# Patient Record
Sex: Male | Born: 1935 | Race: White | Hispanic: No | Smoking: Never smoker
Health system: Southern US, Community
[De-identification: ages and names within clinical notes are randomized; demographics above are authoritative.]

## PROBLEM LIST (undated history)

## (undated) DIAGNOSIS — I313 Pericardial effusion (noninflammatory): Secondary | ICD-10-CM

## (undated) DIAGNOSIS — K5790 Diverticulosis of intestine, part unspecified, without perforation or abscess without bleeding: Secondary | ICD-10-CM

## (undated) DIAGNOSIS — N400 Enlarged prostate without lower urinary tract symptoms: Secondary | ICD-10-CM

## (undated) DIAGNOSIS — I3139 Other pericardial effusion (noninflammatory): Secondary | ICD-10-CM

## (undated) HISTORY — DX: Other pericardial effusion (noninflammatory): I31.39

## (undated) HISTORY — DX: Pericardial effusion (noninflammatory): I31.3

## (undated) HISTORY — DX: Diverticulosis of intestine, part unspecified, without perforation or abscess without bleeding: K57.90

## (undated) HISTORY — DX: Benign prostatic hyperplasia without lower urinary tract symptoms: N40.0

---

## 1958-02-12 HISTORY — PX: APPENDECTOMY: SHX54

## 1978-10-14 HISTORY — PX: INGUINAL HERNIA REPAIR: SUR1180

## 1998-02-12 HISTORY — PX: CHOLECYSTECTOMY: SHX55

## 1998-12-07 ENCOUNTER — Encounter: Payer: Self-pay | Admitting: Emergency Medicine

## 1998-12-07 ENCOUNTER — Encounter (INDEPENDENT_AMBULATORY_CARE_PROVIDER_SITE_OTHER): Payer: Self-pay | Admitting: Specialist

## 1998-12-07 ENCOUNTER — Inpatient Hospital Stay (HOSPITAL_COMMUNITY): Admission: EM | Admit: 1998-12-07 | Discharge: 1998-12-11 | Payer: Self-pay | Admitting: Emergency Medicine

## 1998-12-09 ENCOUNTER — Encounter: Payer: Self-pay | Admitting: Cardiology

## 1998-12-10 ENCOUNTER — Encounter: Payer: Self-pay | Admitting: General Surgery

## 2000-03-12 ENCOUNTER — Emergency Department (HOSPITAL_COMMUNITY): Admission: EM | Admit: 2000-03-12 | Discharge: 2000-03-12 | Payer: Self-pay | Admitting: Emergency Medicine

## 2001-02-12 HISTORY — PX: TOTAL HIP ARTHROPLASTY: SHX124

## 2001-12-10 ENCOUNTER — Encounter: Payer: Self-pay | Admitting: Orthopedic Surgery

## 2001-12-15 ENCOUNTER — Inpatient Hospital Stay (HOSPITAL_COMMUNITY): Admission: RE | Admit: 2001-12-15 | Discharge: 2001-12-17 | Payer: Self-pay | Admitting: Orthopedic Surgery

## 2001-12-15 ENCOUNTER — Encounter: Payer: Self-pay | Admitting: Orthopedic Surgery

## 2001-12-17 ENCOUNTER — Encounter: Payer: Self-pay | Admitting: Physical Medicine & Rehabilitation

## 2001-12-17 ENCOUNTER — Inpatient Hospital Stay (HOSPITAL_COMMUNITY)
Admission: RE | Admit: 2001-12-17 | Discharge: 2001-12-24 | Payer: Self-pay | Admitting: Physical Medicine & Rehabilitation

## 2001-12-22 ENCOUNTER — Encounter: Payer: Self-pay | Admitting: Physical Medicine & Rehabilitation

## 2004-04-13 ENCOUNTER — Ambulatory Visit (HOSPITAL_COMMUNITY): Admission: RE | Admit: 2004-04-13 | Discharge: 2004-04-13 | Payer: Self-pay | Admitting: Gastroenterology

## 2007-02-13 HISTORY — PX: CATARACT EXTRACTION: SUR2

## 2007-02-13 HISTORY — PX: WRIST SURGERY: SHX841

## 2007-10-27 ENCOUNTER — Emergency Department (HOSPITAL_BASED_OUTPATIENT_CLINIC_OR_DEPARTMENT_OTHER): Admission: EM | Admit: 2007-10-27 | Discharge: 2007-10-27 | Payer: Self-pay | Admitting: Emergency Medicine

## 2009-01-10 ENCOUNTER — Encounter: Admission: RE | Admit: 2009-01-10 | Discharge: 2009-02-10 | Payer: Self-pay | Admitting: Orthopedic Surgery

## 2010-03-09 LAB — CBC
HCT: 40.4 % (ref 39.0–52.0)
Hemoglobin: 13.6 g/dL (ref 13.0–17.0)
MCH: 33.3 pg (ref 26.0–34.0)
MCHC: 33.7 g/dL (ref 30.0–36.0)
MCV: 99 fL (ref 78.0–100.0)
Platelets: 169 10*3/uL (ref 150–400)
RBC: 4.08 MIL/uL — ABNORMAL LOW (ref 4.22–5.81)
RDW: 13.3 % (ref 11.5–15.5)
WBC: 6.6 10*3/uL (ref 4.0–10.5)

## 2010-03-09 LAB — BASIC METABOLIC PANEL
BUN: 21 mg/dL (ref 6–23)
CO2: 30 mEq/L (ref 19–32)
Calcium: 9.4 mg/dL (ref 8.4–10.5)
Chloride: 103 mEq/L (ref 96–112)
Creatinine, Ser: 0.89 mg/dL (ref 0.4–1.5)
GFR calc Af Amer: >60 mL/min (ref >60)
GFR calc non Af Amer: >60 mL/min (ref >60)
Glucose, Bld: 122 mg/dL — ABNORMAL HIGH (ref 70–99)
Potassium: 4.8 mEq/L (ref 3.5–5.1)
Sodium: 139 mEq/L (ref 135–145)

## 2010-03-09 LAB — SURGICAL PCR SCREEN: Staphylococcus aureus: POSITIVE — AB

## 2010-03-16 ENCOUNTER — Ambulatory Visit (HOSPITAL_COMMUNITY)
Admission: RE | Admit: 2010-03-16 | Discharge: 2010-03-17 | Disposition: A | Discharge status: Home / Self Care | Payer: Medicare Other | Attending: Urology | Admitting: Urology

## 2010-03-16 ENCOUNTER — Other Ambulatory Visit: Payer: Self-pay | Admitting: Urology

## 2010-03-16 DIAGNOSIS — I10 Essential (primary) hypertension: Secondary | ICD-10-CM | POA: Insufficient documentation

## 2010-03-16 DIAGNOSIS — R310 Gross hematuria: Secondary | ICD-10-CM | POA: Insufficient documentation

## 2010-03-16 DIAGNOSIS — N401 Enlarged prostate with lower urinary tract symptoms: Secondary | ICD-10-CM | POA: Insufficient documentation

## 2010-03-16 DIAGNOSIS — Z79899 Other long term (current) drug therapy: Secondary | ICD-10-CM | POA: Insufficient documentation

## 2010-03-16 DIAGNOSIS — Z01812 Encounter for preprocedural laboratory examination: Secondary | ICD-10-CM | POA: Insufficient documentation

## 2010-03-16 DIAGNOSIS — Z01810 Encounter for preprocedural cardiovascular examination: Secondary | ICD-10-CM | POA: Insufficient documentation

## 2010-03-16 DIAGNOSIS — N138 Other obstructive and reflux uropathy: Secondary | ICD-10-CM | POA: Insufficient documentation

## 2010-03-16 LAB — ABO/RH: ABO/RH(D): A POS

## 2010-03-16 LAB — TYPE AND SCREEN
ABO/RH(D): A POS
Antibody Screen: NEGATIVE

## 2010-03-18 NOTE — Op Note (Signed)
Terry Gregory, Terry Gregory NO.:  [id number]  MEDICAL RECORD NO.:  [id number]           PATIENT TYPE:  O  LOCATION:  DAYL                         FACILITY:  [facility]  PHYSICIAN:  Heloise Purpura, MD      DATE OF BIRTH:  1921/06/09  DATE OF PROCEDURE:  03/16/2010 DATE OF DISCHARGE:                              OPERATIVE REPORT   PREOPERATIVE DIAGNOSES: 1. Benign prostatic hyperplasia and lower urinary tract symptoms. 2. Gross hematuria.  POSTOPERATIVE DIAGNOSES: 1. Benign prostatic hyperplasia and lower urinary tract symptoms. 2. Gross hematuria.  PROCEDURES: 1. Cystoscopy. 2. Transurethral resection of the prostate.  SURGEON:  Heloise Purpura, MD  ANESTHESIA:  General.  COMPLICATIONS:  None.  ESTIMATED BLOOD LOSS:  Minimal.  SPECIMENS:  Prostate chips.  DISPOSITION:  Specimen to pathology.  DRAINS:  22-French three-way Foley catheter.  INDICATION:  [name] is an 77 year old gentleman who has had severe lower urinary tract symptoms, which have been unresponsive to conservative therapy.  In addition, he recently developed gross hematuria and underwent an evaluation, which included a CT scan and cystoscopy.  No upper urinary tract etiology was identified.  On cystoscopy, the patient was noted to have a large amount of regrowth of his BPH creating a ball valve effect from the median lobe.  His prostate also was extremely friable and most likely the cause for his gross hematuria.  However, he did have some frondular tissue overlying the urothelium in the prostatic urethra, which was potentially concerning for malignancy, although his urine cytology was negative.  After a discussion regarding management options for treatment, he elected to proceed with the above procedures.  The potential risks, complications, and alternative treatment options were discussed in detail and informed consent was obtained.  DESCRIPTION OF PROCEDURE:  The patient was taken to  the operating room and a general anesthetic was administered.  He was given preoperative antibiotics, placed in the dorsal lithotomy position, prepped and draped in the usual sterile fashion.  Next, a preoperative time-out was performed.  Cystourethroscopy was then performed, which revealed a normal anterior urethra.  The prostatic urethra was significant for a large intravesical lobe extending from the posterior/left side of the prostatic urethra and projecting into the bladder.  The ureteral orifices were identified and their normal anatomic position were well away from the bladder neck.  The bladder was also noted to be mildly trabeculated.  A 28-French resectoscope sheath was placed into the urethra without difficulty.  Using the bipolar gyrus loop resection, the aforementioned intravesical lobe of the prostate was then resected along with some of the urothelium within the prostatic urethra, which was somewhat frondular.  All bleeding sites were controlled with cautery. These chips were then evacuated from the bladder.  Inspection revealed no further prostate chips and, again, hemostasis was ensured.  A 22- French three-way Foley catheter was then placed and the patient was begun on normal saline continuous bladder irrigation.  He tolerated the procedure well without complications.  He was able to be transferred to the recovery unit in satisfactory condition.     Heloise Purpura, MD     LB/MEDQ  D:  03/16/2010  T:  03/16/2010  Job:  [id number]  Electronically Signed by Heloise Purpura MD on 03/18/2010 06:34:13 AM

## 2010-03-26 NOTE — Discharge Summary (Signed)
  NAMECHASTON, BRADBURN               ACCOUNT NO.:  [id number]  MEDICAL RECORD NO.:  [id number]           PATIENT TYPE:  I  LOCATION:  1440                         FACILITY:  [facility]  PHYSICIAN:  Heloise Purpura, MD      DATE OF BIRTH:  1921/11/30  DATE OF ADMISSION:  03/16/2010 DATE OF DISCHARGE:  03/17/2010                              DISCHARGE SUMMARY   ADMISSION DIAGNOSES: 1. Benign prostatic hyperplasia. 2. Gross hematuria.  DISCHARGE DIAGNOSES: 1. Benign prostatic hyperplasia. 2. Gross hematuria.  PROCEDURES: 1. Cystoscopy. 2. Transurethral resection of the prostate.  HISTORY AND PHYSICAL:  For full details, please see admission history and physical.  Briefly, Mr. Terry Gregory is an 77 year old gentleman who was noted to have gross hematuria along with lower urinary tract symptoms secondary to bladder outlet obstruction and benign prostatic hyperplasia.  We discussed options and he elected to proceed with the above procedures.  HOSPITAL COURSE:  On March 16, 2010, the patient was taken to the operating room and underwent a cystoscopy along with transurethral resection of the prostate.  He did not have any findings particularly concerning for malignancy.  Postoperatively, he was transferred to a regular hospital room following recovery from anesthesia and was maintained on continuous bladder irrigation with a 3-way catheter.  He remained hemodynamically stable and his urine cleared throughout the evening, and on postoperative day #1 his catheter was removed.  He was unable to void later that afternoon, however.  Therefore, Foley catheter was replaced and he was sent home with an indwelling catheter.  His urine was grossly clear at that time.  DISPOSITION:  Home.  DISCHARGE MEDICATIONS:  He was instructed to resume his regular home medications.  In addition, he was given a prescription to take Vicodin as needed for pain and Colace as a stool softener.  He was  also administered ciprofloxacin as prophylaxis.  DISCHARGE INSTRUCTIONS:  He was instructed to be ambulatory, but specifically told to refrain from any heavy lifting, strenuous activity, or driving.  He was instructed on routine Foley catheter care and administered a leg bag for daytime use.  FOLLOWUP:  He will follow up in the office next week for a voiding trial.     Heloise Purpura, MD     LB/MEDQ  D:  03/21/2010  T:  03/22/2010  Job:  [id number]  Electronically Signed by Heloise Purpura MD on 03/26/2010 09:19:59 PM

## 2010-06-30 NOTE — Discharge Summary (Signed)
NAMEYEHUDA, PRINTUP NO.:  [id number]   MEDICAL RECORD NO.:  [id number]                   PATIENT TYPE:  IPS   LOCATION:  4143                                 FACILITY:  [facility]   PHYSICIAN:  Ellwood Dense, M.D.                DATE OF BIRTH:  September 17, 1921   DATE OF ADMISSION:  12/17/2001  DATE OF DISCHARGE:  12/24/2001                                 DISCHARGE SUMMARY   DISCHARGE DIAGNOSES:  1. Right total hip arthroplasty secondary to advanced osteoarthritis on     December 15, 2001.  2. Postoperative anemia.  3. Hypertension.  4. Benign prostatic hypertrophy.   HISTORY OF PRESENT ILLNESS:  A 77 year old white male admitted on December 15, 2001, with increased right hip pain x 1-1/2 years.  X-rays with advanced  osteoarthritis.  No relief with conservative care.  He underwent a right  total hip arthroplasty on December 15, 2001, per Mila Homer. Sherlean Foot, M.D.  Placed on subcutaneous Lovenox for deep venous thrombosis prophylaxis and  weightbearing as tolerated.  Postoperative pain management with PCA pump,  which was discontinued on December 17, 2001.  No chest pain or shortness of  breath.  Moderate assist for bed mobility and transfers.  Latest hemoglobin  9.8.  Chemistries unremarkable.  Admitted for a comprehensive rehabilitation  program.   PAST MEDICAL HISTORY:  1. Hypertension.  2. Benign prostatic hypertrophy.   PAST SURGICAL HISTORY:  1. Cholecystectomy.  2. Appendectomy.   ALLERGIES:  PENICILLIN and E-MCYIN.   PRIMARY CARE PHYSICIAN:  Geoffry Paradise, M.D.   HABITS:  Denies alcohol or tobacco.   MEDICATIONS PRIOR TO ADMISSION:  1. Toprol XL 50 mg daily.  2. Elavil 10 mg at bedtime.   SOCIAL HISTORY:  [place]  Independent prior to admission.  Retired.  One-level home with two steps to  entry.  His wife can assist on discharge.  His daughter works day shift.   HOSPITAL COURSE:   The patient did well while in rehabilitation services with  therapies initiated on a b.i.d. basis.  The following issues were following  during the patient's rehabilitation course.  Pertaining to the patient's  right total hip arthroplasty, surgical site healing nicely with no signs of  infection.  He was weightbearing as tolerated with hip precautions as per  orthopedic services, Mila Homer. Sherlean Foot, M.D.  He continued on subcutaneous  Lovenox for deep venous thrombosis prophylaxis.  Venous Doppler studies  prior to his discharge were negative.  Postoperative anemia stable with  latest hemoglobin 10.1 and hematocrit 29.9.  The blood pressure is  controlled on Toprol XL  He had no bowel or bladder disturbances with noted  history of benign prostatic hypertrophy.  During his rehabilitation course,  he spiked a fever to 102 degrees.  The initial chest x-ray had questionable  infiltrate to the left lower  lobe.  He was placed on Cipro on December 18, 2001, at 500 mg twice daily.  The fever had resolved.  He had no productive  cough and no sputum activity.  He developed a rash to his upper back and  buttocks.  Hydrocortisone cream had been ordered.  Cipro discontinued on  December 22, 2001, secondary to rash.  Follow-up chest x-ray with no active  disease.  The rash continued to improved.   Functional mobility with supervision for bed mobility, ambulating 150-200  feet with close supervision, and navigating stairs with supervision.  He  required simple setup for activities of daily living.  He was refusing  adaptive equipment.   The latest labs include a hemoglobin of 10.1, hematocrit 29.9, platelets  121,000, sodium 134, potassium 3.7, BUN 12, and creatinine 1.0.   DISCHARGE MEDICATIONS:  1. OxyContin Sustained Release 10 mg one tablet every 12 hours x 1 week and     then stop.  2. Tylox as needed for pain.  3. Toprol XL 50 mg daily.  4. Elavil 10 mg at bedtime.  5. Tylenol as needed.    ACTIVITY:  Weightbearing as tolerated with hip precautions.   DIET:  Regular.   WOUND CARE:  Cleanse incision daily with warm soap and water.   FOLLOW-UP:  Home health physical and occupational therapy.  The patient  should follow up with Mila Homer. Sherlean Foot, M.D., of orthopedic services, in one  week for removal of staples and with Geoffry Paradise, M.D., for medical  management.     Mariam Dollar, P.A.                     Ellwood Dense, M.D.    DA/MEDQ  D:  12/23/2001  T:  12/23/2001  Job:  [id number]   cc:   Geoffry Paradise, M.D.  [address]  [place]  [place] [address]  Fax: [phone]   Mila Homer. Sherlean Foot, M.D.  [address] [place]  [place] [address]  Fax: [phone]

## 2010-06-30 NOTE — Discharge Summary (Signed)
NAMEELAND, LAMANTIA                           ACCOUNT NO.:  [id number]   MEDICAL RECORD NO.:  [id number]                   PATIENT TYPE:   LOCATION:                                       FACILITY:  [facility]   PHYSICIAN:  Mila Homer. Sherlean Foot, M.D.              DATE OF BIRTH:  [birth year]   DATE OF ADMISSION:  12/15/2001  DATE OF DISCHARGE:  12/17/2001                                 DISCHARGE SUMMARY   ADMISSION DIAGNOSES:  1. End-stage osteoarthritis, right hip.  2. Hypertension.  3. Benign prostatic hypertrophy.   DISCHARGE DIAGNOSES:  1. End-stage osteoarthritis, right hip, status post right total hip     arthroplasty.  2. Acute blood loss anemia secondary to surgery.  3. Constipation.  4. Mild leukocytosis.  5. Hypertension.  6. Benign prostatic hypertrophy.   SURGICAL PROCEDURE:  On December 15, 2001, the patient underwent a right  total hip arthroplasty by Dr. Mila Homer. Lucey, assisted by Legrand Pitts Duffy,  P.A.C.  He had a Zimmer Trilogy spiked acetabular system, size 64-mm porous-  coat acetabular cup with a Longevity cross-link polyethylene liner, 32 mm,  with 10 degree elevated rim.  He had a VerSys hip system femoral stem, 12/14  neck taper, collared, size 14, and then a VerSys hip system femoral head,  12/14 taper, 32-mm diameter head with +0-mm neck length.   COMPLICATIONS:  None.   CONSULTATIONS:  1. Physical therapy and rehab medicine consult, December 16, 2001.  2. Occupational therapy consult, December 17, 2001.   HISTORY OF PRESENT ILLNESS:  This 77 year old white male patient presented  to Dr. Sherlean Foot with a one-and-a-half-year history of progressively worsening  right hip pain and difficulty with range of motion.  The pain is sharp and  stabbing in the right groin and occurs with range of motion.  There is no  radiation.  He has difficulty in climbing up or down stairs and getting in  and out of chairs; because of this, he is presenting for right hip  replacement.   HOSPITAL COURSE:  The patient tolerated his surgical procedure well without  immediate postoperative complications.  He was transferred to 5000.  On  postop day 1, he was afebrile, vital signs stable.  Hemoglobin 10.6,  hematocrit 31.5.  He was started on therapy per protocol.   Postop day #2, he continued to do well, T-max 101.4, vitals otherwise  stable, right hip incision well-approximated, hemoglobin 9.8, hematocrit  29.7.  He had a mild leukocytosis of 12.4 and that was monitored.  A rehab  bed became available for him and he was able to be transferred to rehab at  that time.   DISCHARGE INSTRUCTIONS:  Diet:  He can continue his current hospitalization  diet.   Medications:  He is to continue his current hospitalization medications with  adjustments to be made per the rehab physicians.   Activity:  He is  to be out of bed, weightbearing as tolerated on the right  leg with the use of the walker.  Continue PT and OT per rehab protocols.   Wound care:  Please keep the right hip incision clean and dry and may clean  the wound with Betadine every day.  He needs staples removed about postop  day 14.  Steri-Strips with Benzoin can be applied at that time.  This can be  done while he is in rehab, but if he is discharged prior to this date, then  he needs to follow up with Dr. Sherlean Foot about that time.   FOLLOWUP:  He needs to follow up with Dr. Sherlean Foot in our office either on  approximately postop day 14 for staple removal and first postop check, or  two weeks after discharge from rehab if his staples are removed while he is  in rehab.   SPECIAL DISCHARGE INSTRUCTIONS:  Please notify Dr. Sherlean Foot of temperature  greater than 101.5, chills, pain unrelieved by pain medications or foul-  smelling drainage from the wound.   LABORATORY DATA:  AP of the pelvis done on December 15, 2001 showed a  satisfactory right total hip replacement with the femoral head prosthesis  and rod in  satisfactory position.   On December 16, 2001, hemoglobin 10.6, hematocrit 31.5, platelets 130,000.  On November 5th, white count 12.5, hemoglobin 9.8, hematocrit 29.7,  platelets 114,000.  On November 4th, glucose 139, and on November 5th,  glucose 167.  All other laboratory studies were within normal limits.     Legrand Pitts Duffy, P.A.                      Mila Homer. Sherlean Foot, M.D.    KED/MEDQ  D:  01/15/2002  T:  01/16/2002  Job:  [id number]   cc:   Geoffry Paradise, M.D.  [address]  [place]  [place] [address]  Fax: [phone]

## 2010-06-30 NOTE — Op Note (Signed)
NAMECALEEL, KINER                           ACCOUNT NO.:  [id number]   MEDICAL RECORD NO.:  [id number]                   PATIENT TYPE:  INP   LOCATION:  NA                                   FACILITY:  [facility]   PHYSICIAN:  Mila Homer. Sherlean Foot, M.D.              DATE OF BIRTH:  [birth year]   DATE OF PROCEDURE:  12/15/2001  DATE OF DISCHARGE:                                 OPERATIVE REPORT   SURGEON:  Mila Homer. Sherlean Foot, M.D.   ASSISTANT:  Legrand Pitts. Duffy, P.A.   PREOPERATIVE DIAGNOSIS:  Osteoarthritis right hip.   POSTOPERATIVE DIAGNOSIS:  Osteoarthritis right hip.   PROCEDURE:  Right total hip arthroplasty.   INDICATIONS FOR PROCEDURE:  The patient is a 77 year old white male with  failure of conservative measures for osteoarthritis of the hip.  The patient  understood the risks versus benefits of the procedure and informed consent  was obtained.  Preoperative medical clearance was obtained as well.   DESCRIPTION OF PROCEDURE:  The patient was laid supine, administered general  anesthesia, a Foley catheter was placed, and he was then placed in the left  down, right up lateral decubitus position and held in place with hip  positioners and bony prominences were well-padded.  The right hip was  prepped and draped in usual sterile fashion.  Mini incision was used and  made approximately 4.5 inches long with a #10-blade centered over the  anterior half of the greater trochanter.  Cautery dissection was continued  down to and through the fascia lata and the fascia lata was held in place  with a Charnley retractor.  At this point, the anterior half of the gluteus  medius and vastus lateralis was elevated off the trochanter.  The gluteus  minimus was elevated as well and these three structures were tagged.  The  anterior hip capsule was removed.  The hip was then dislocated with flexion  and external rotation.  The foot was placed off in a sterile pouch.  The  neck cut was made with a  sagittal saw according to the neck cutting  template.  The head was removed and sized.  At this point, the retractors  were placed anterior and posterior to the acetabulum.  The labrum was  removed.  The transverse acetabular ligament was cut so we could get the  femur to roll further away from the pelvis.  There was a large anterior  osteophyte, which I removed with an osteotome, mallet, and rongeur.  There  was also a kissing osteophyte on the trochanter and that was removed as  well.  Once I had removed all the labrum and the deep soft tissues in the  socket, I then searched sizes on the table with my P.A. and then  sequentially reamed up to a 62 reamer and then with a 62 trial in place, I  could tell that this was an  excellent fit, so I placed in a 64 mm fiber  mesh, 3-spiked cup.  At this point, I placed a trial 10 mm posterior lip  liner in place and then turned our attention to the femoral canal.  I then  flexed and externally rotated the leg off the bed into the sterile pouch,  used the canal finder to find the canal, side-biting reamer to get into the  trochanter so we could put it in neutral, and then sequentially reamed up to  14 mm.  I then broached up to a size 14 and used the medial modified broach  to have more metaphyseal fit.  I then placed the Foy porous-coated, size 14,  medial modified stem down the canal and at this point, placed a trial with a  zero ball, which afforded excellent stability.  I then cleaned off the Morse  taper, tamped on the zero ball located to fit.  I took it through an  aggressive range of motion.  There was excellent stability.  Leg length was  also good.  Then, irrigated, closed the gluteus medius, minimus, and vastus  lateralis through three drill holes in the trochanter.  Then, closed the  fascia lata with interrupted #1 Vicryl sutures, deep subcutaneous tissues  with interrupted zero Vicryls, subcuticular 2-0 Vicryl, and skin staples.  Dressed  with Adaptic, 4 x 4s, sterile Webril, and a sterile Ioban drape.  The patient tolerated the procedure well.  Complications - none.  Drains -  none.  Estimated blood loss - 300 cc.                                                Mila Homer. Sherlean Foot, M.D.    SDL/MEDQ  D:  12/15/2001  T:  12/15/2001  Job:  [id number]

## 2010-06-30 NOTE — Op Note (Signed)
NAMEPADEN, SENGER               ACCOUNT NO.:  [id number]   MEDICAL RECORD NO.:  [id number]          PATIENT TYPE:  AMB   LOCATION:  ENDO                         FACILITY:  [facility]   PHYSICIAN:  John C. Madilyn Fireman, M.D.    DATE OF BIRTH:  31-Oct-1921   DATE OF PROCEDURE:  04/13/2004  DATE OF DISCHARGE:                                 OPERATIVE REPORT   PROCEDURE:  Colonoscopy.   ENDOSCOPIST:  Everardo All. Madilyn Fireman, M.D.   INDICATIONS FOR PROCEDURE:  Average-risk colon cancer screening in an 77-  year-old patient.   PROCEDURE:  The patient was placed in the left lateral decubitus position  and placed on the pulse monitor with continuous low-flow oxygen delivered by  nasal cannula.  He was sedated with 50 mcg IV fentanyl and 5 milligrams IV  Versed.  The Olympus video colonoscope was inserted into the rectum and  advanced to the cecum, confirmed by transillumination of McBurney's point  and visualization of ileocecal valve and appendiceal orifice.  The prep was  suboptimal in some areas and I could not rule out lesions less than 1 cm in  all the places; otherwise, the cecum and ascending colon appeared normal.  Within the transverse, descending and sigmoid colon there were seen multiple  diverticula and no other abnormalities.  The rectum appeared normal and a  retroflexed view of the anus revealed no obvious internal hemorrhoids.  The  scope was then withdrawn and the patient returned to the recovery room in  stable condition.  He tolerated the procedure well and there were no  immediate complications.   IMPRESSION:  Diverticulosis, otherwise normal study.      JCH/MEDQ  D:  04/13/2004  T:  04/13/2004  Job:  [id number]   cc:   Geoffry Paradise, M.D.  [address]  [place]  [place] [address]  Fax: [phone]

## 2010-06-30 NOTE — H&P (Signed)
NAMESTPEHEN, Terry Gregory                           ACCOUNT NO.:  [id number]   MEDICAL RECORD NO.:  [id number]                   PATIENT TYPE:   LOCATION:                                       FACILITY:  [facility]   PHYSICIAN:  Mila Homer. Sherlean Foot, M.D.              DATE OF BIRTH:  [birth year]   DATE OF ADMISSION:  12/15/2001  DATE OF DISCHARGE:                                HISTORY & PHYSICAL   CHIEF COMPLAINT:  Right hip pain.   HISTORY OF PRESENT ILLNESS:  The patient is a 77 year old white male, with a  history of one and a half year, progressively worsening right hip pain and  difficulty with range of motion.  The patient's states he has a significant  amount of discomfort and difficulty with any steps, climbing stairs, in and  out of chairs, carrying heavy loads.  He does describe the pain as a sharp,  stabbing type pain in his right groin.  It does occur with range of motion.  It does not radiate anywhere.  He does not have any discomfort while  resting.  He denies any mechanical symptoms.  X-rays reveal virtual medial  joint spacing obliteration with a large osteophyte.   ALLERGIES:  1. PENICILLIN.  2. ERYTHROMYCIN.   CURRENT MEDICATIONS:  1. __________ 500 mg p.o. b.i.d.  2. Toprol XL 50 mg p.o. q.d.  3. Amitriptyline 100 mg p.o. q.h.s.   PAST MEDICAL HISTORY:  1. Hypertension.  2. BPH.   PAST SURGICAL HISTORY:  1. An appendectomy in 1960.  2. Prostate surgery in 2002.  3. Cholecystitis in 2000.  4. The patient denies any complications with of his above-mentioned surgical     procedures.   SOCIAL HISTORY:  The patient is a 77 year old, thin, healthy-appearing,  white male.  Denies any history of smoking or alcohol use.  He is married,  lives with his wife in an [place] house with two steps to the main  entrance.  He is a retired [employer].   FAMILY PHYSICIAN:  Geoffry Paradise, MD at [phone].   FAMILY MEDICAL HISTORY:  Mother is deceased from a cerebral  hemorrhage.  Father is deceased from a heart attack.  The patient has one brother  deceased from cardiac disease.  One sister deceased from brain cancer, a  second deceased from a rare blood disorder.  The patient has two brothers  alive, one with a significant cardiac history, the other with hypertension,  and one sister alive, unknown medical issues.   REVIEW OF SYSTEMS:  Positive for glasses at all times.  He does have some  generalized hearing loss.  He does have increased urinary urgency and  frequency due to his prostate difficulties.  Otherwise, all of the other  review of systems are negative including sensory, respiratory, cardiac, GI,  GU, general, hematologic, musculoskeletal, neurologic, and mental status.   PHYSICAL EXAMINATION:  VITAL SIGNS:  Height is 6 feet 3 inches.  Weight is  210 pounds.  Pulse is 72 and regular, respirations 14, temperature 96.7,  blood pressure 146/72.  GENERAL:  This is a healthy-appearing, tall in stature, well-developed white  male.  He ambulates with a little obvious gait abnormality.  He is able to  get himself on and off the exam table without any difficulty.  HEENT:  Head is normocephalic, atraumatic.  He is nontender over facial  sinus regions.  External ears without deformities.  Canals patent.  TM's  pearly gray and intact.  The patient is able to hear normal voice tones  without difficulty.  Pupils are equal, round, reactive, accommodating to  light.  Extraocular movements intact.  Sclerae is not icteric.  Conjunctivae  is pink and moist.  Nasal septum is midline.  Mucous membranes pink and  moist.  Oral buccal mucosa is pink and moist without lesions.  Dentition is  in fair repair.  Uvula is midline.  Patient is able to swallow without any  difficulty.  NECK:  Supple, no palpable lymphadenopathy.  Thyroid glands are nontender.  He has excellent range of motion of cervical spine without any difficulty or  tenderness with palpation or  percussion along the spinal column.  CHEST:  Lungs sounds are clear and equal bilaterally.  No wheezes, rales,  rhonchi, or rubs noted.  HEART:  Distant regular rate and rhythm.  S1 and S2 are auscultated.  No  murmurs, rubs, or gallops noted.  ABDOMEN:  Soft, nontender, unable to palpate any hepatosplenomegaly.  CVA  was nontender to percussion.  Bowel sounds are normal throughout.  EXTREMITIES:  Upper extremities are symmetrically size and shape.  He has  excellent range of motion of his shoulders, elbows, and wrists with 5/5  motor strength.  Left hip had full extension and flexion up to 130 degrees.  He has 20-30 degrees internal and external rotation without difficulty.  Right hip has full extension, flexion up to 120 degrees.  He has 0 degrees  internal rotation and 10 degrees external rotation, limited by discomfort.  Bilateral knees are symmetrical with good range of motion.  No obvious  abnormalities.  No signs of effusions or infections.  Calves are nontender.  Ankles are symmetrical with good dorsoplantar flexion.  Peripheral vascular  carotid pulses 2+, no bruits.  Radial pulses 2+.  Femoral pulses 2+ on the  right, 3+ on the left with 2+ posterior tibial and dorsalis pedis pulses.  No varicosities or venostasis changes noted.  NEUROLOGIC:  The patient is conscious, alert, and appropriate with  conversation with examiner.  Cranial nerves 2-12 are grossly intact.  Deep  tendon reflexes are symmetrical, right to left, upper and lower extremities.  He has no gross neurologic deficits.   IMPRESSION:  1. End-stage osteoarthritis, right hip.  2. Hypertension.  3. Benign prostatic hypertrophy.   PLAN:  The patient will be admitted to [facility] on [facility] 15, 2001, under the care of Mila Homer. Lucey, M.D., for a right total hip  arthroplasty.  The patient will undergo all routine labs and tests prior to having the surgical procedure.  The patient has been evaluated by  Geoffry Paradise, MD and felt capable of having the surgery at this time.      Jamelle Rushing, P.A.                      Mila Homer. Sherlean Foot, M.D.  RWK/MEDQ  D:  12/02/2001  T:  12/02/2001  Job:  [id number]

## 2010-08-23 ENCOUNTER — Other Ambulatory Visit: Payer: Self-pay | Admitting: Dermatology

## 2010-11-15 LAB — COMPREHENSIVE METABOLIC PANEL
ALT: 20
AST: 30
Albumin: 4.1
Alkaline Phosphatase: 52
BUN: 16
CO2: 28
Calcium: 9.2
Chloride: 105
Creatinine, Ser: 0.9
GFR calc Af Amer: >60
GFR calc non Af Amer: >60
Glucose, Bld: 81
Potassium: 4.7
Sodium: 140
Total Bilirubin: 0.4
Total Protein: 7

## 2010-11-15 LAB — APTT: aPTT: 27

## 2010-11-15 LAB — CBC
HCT: 39.5
Hemoglobin: 13.6
MCHC: 34.3
MCV: 96.7
Platelets: 163
RBC: 4.09 — ABNORMAL LOW
RDW: 12.7
WBC: 9.5

## 2010-11-15 LAB — PROTIME-INR
INR: 1.1
Prothrombin Time: 14.4

## 2010-11-15 LAB — URINALYSIS, ROUTINE W REFLEX MICROSCOPIC
Bilirubin Urine: NEGATIVE
Glucose, UA: NEGATIVE
Hgb urine dipstick: NEGATIVE
Ketones, ur: NEGATIVE
Nitrite: NEGATIVE
Protein, ur: NEGATIVE
Specific Gravity, Urine: 1.012
Urobilinogen, UA: 0.2
pH: 6.5

## 2010-11-15 LAB — DIFFERENTIAL
Basophils Absolute: 0
Basophils Relative: 0
Eosinophils Absolute: 0
Eosinophils Relative: 0
Lymphocytes Relative: 12
Lymphs Abs: 1.1
Monocytes Absolute: 0.7
Monocytes Relative: 7
Neutro Abs: 7.7
Neutrophils Relative %: 80 — ABNORMAL HIGH

## 2010-11-15 LAB — POCT CARDIAC MARKERS
CKMB, poc: 1.8
CKMB, poc: 3.2
Myoglobin, poc: 164
Myoglobin, poc: 184
Troponin i, poc: <0.05
Troponin i, poc: <0.05

## 2010-11-15 LAB — URINE CULTURE
Colony Count: NO GROWTH
Culture: NO GROWTH

## 2010-12-21 ENCOUNTER — Other Ambulatory Visit: Payer: Self-pay | Admitting: Dermatology

## 2011-02-28 DIAGNOSIS — L819 Disorder of pigmentation, unspecified: Secondary | ICD-10-CM | POA: Diagnosis not present

## 2011-02-28 DIAGNOSIS — Z85828 Personal history of other malignant neoplasm of skin: Secondary | ICD-10-CM | POA: Diagnosis not present

## 2011-02-28 DIAGNOSIS — L821 Other seborrheic keratosis: Secondary | ICD-10-CM | POA: Diagnosis not present

## 2011-03-28 DIAGNOSIS — R109 Unspecified abdominal pain: Secondary | ICD-10-CM | POA: Diagnosis not present

## 2011-03-28 DIAGNOSIS — K5730 Diverticulosis of large intestine without perforation or abscess without bleeding: Secondary | ICD-10-CM | POA: Diagnosis not present

## 2011-03-28 DIAGNOSIS — R1013 Epigastric pain: Secondary | ICD-10-CM | POA: Diagnosis not present

## 2011-03-28 DIAGNOSIS — I319 Disease of pericardium, unspecified: Secondary | ICD-10-CM | POA: Diagnosis not present

## 2011-03-29 DIAGNOSIS — K5730 Diverticulosis of large intestine without perforation or abscess without bleeding: Secondary | ICD-10-CM | POA: Diagnosis not present

## 2011-03-29 DIAGNOSIS — R1013 Epigastric pain: Secondary | ICD-10-CM | POA: Diagnosis not present

## 2011-04-16 DIAGNOSIS — R609 Edema, unspecified: Secondary | ICD-10-CM | POA: Diagnosis not present

## 2011-04-16 DIAGNOSIS — I319 Disease of pericardium, unspecified: Secondary | ICD-10-CM | POA: Diagnosis not present

## 2011-04-16 DIAGNOSIS — I10 Essential (primary) hypertension: Secondary | ICD-10-CM | POA: Diagnosis not present

## 2011-04-19 DIAGNOSIS — I318 Other specified diseases of pericardium: Secondary | ICD-10-CM | POA: Diagnosis not present

## 2011-04-19 DIAGNOSIS — R0602 Shortness of breath: Secondary | ICD-10-CM | POA: Diagnosis not present

## 2011-04-24 DIAGNOSIS — I319 Disease of pericardium, unspecified: Secondary | ICD-10-CM | POA: Diagnosis not present

## 2011-04-24 DIAGNOSIS — Z0000 Encounter for general adult medical examination without abnormal findings: Secondary | ICD-10-CM | POA: Diagnosis not present

## 2011-04-24 DIAGNOSIS — M1990 Unspecified osteoarthritis, unspecified site: Secondary | ICD-10-CM | POA: Diagnosis not present

## 2011-04-24 DIAGNOSIS — J984 Other disorders of lung: Secondary | ICD-10-CM | POA: Diagnosis not present

## 2011-05-02 DIAGNOSIS — R0602 Shortness of breath: Secondary | ICD-10-CM | POA: Diagnosis not present

## 2011-05-02 DIAGNOSIS — I318 Other specified diseases of pericardium: Secondary | ICD-10-CM | POA: Diagnosis not present

## 2011-05-21 DIAGNOSIS — H43819 Vitreous degeneration, unspecified eye: Secondary | ICD-10-CM | POA: Diagnosis not present

## 2011-05-21 DIAGNOSIS — H01009 Unspecified blepharitis unspecified eye, unspecified eyelid: Secondary | ICD-10-CM | POA: Diagnosis not present

## 2011-05-21 DIAGNOSIS — H04129 Dry eye syndrome of unspecified lacrimal gland: Secondary | ICD-10-CM | POA: Diagnosis not present

## 2011-05-21 DIAGNOSIS — Z961 Presence of intraocular lens: Secondary | ICD-10-CM | POA: Diagnosis not present

## 2011-07-05 DIAGNOSIS — I10 Essential (primary) hypertension: Secondary | ICD-10-CM | POA: Diagnosis not present

## 2011-07-05 DIAGNOSIS — Z125 Encounter for screening for malignant neoplasm of prostate: Secondary | ICD-10-CM | POA: Diagnosis not present

## 2011-07-12 DIAGNOSIS — I10 Essential (primary) hypertension: Secondary | ICD-10-CM | POA: Diagnosis not present

## 2011-07-12 DIAGNOSIS — R7309 Other abnormal glucose: Secondary | ICD-10-CM | POA: Diagnosis not present

## 2011-07-12 DIAGNOSIS — I319 Disease of pericardium, unspecified: Secondary | ICD-10-CM | POA: Diagnosis not present

## 2011-07-12 DIAGNOSIS — Z125 Encounter for screening for malignant neoplasm of prostate: Secondary | ICD-10-CM | POA: Diagnosis not present

## 2011-07-12 DIAGNOSIS — Z0000 Encounter for general adult medical examination without abnormal findings: Secondary | ICD-10-CM | POA: Diagnosis not present

## 2011-07-16 DIAGNOSIS — Z1212 Encounter for screening for malignant neoplasm of rectum: Secondary | ICD-10-CM | POA: Diagnosis not present

## 2011-07-31 DIAGNOSIS — I319 Disease of pericardium, unspecified: Secondary | ICD-10-CM | POA: Diagnosis not present

## 2011-08-29 DIAGNOSIS — L570 Actinic keratosis: Secondary | ICD-10-CM | POA: Diagnosis not present

## 2011-08-29 DIAGNOSIS — Z85828 Personal history of other malignant neoplasm of skin: Secondary | ICD-10-CM | POA: Diagnosis not present

## 2011-08-29 DIAGNOSIS — B359 Dermatophytosis, unspecified: Secondary | ICD-10-CM | POA: Diagnosis not present

## 2011-08-29 DIAGNOSIS — L253 Unspecified contact dermatitis due to other chemical products: Secondary | ICD-10-CM | POA: Diagnosis not present

## 2011-10-30 DIAGNOSIS — I319 Disease of pericardium, unspecified: Secondary | ICD-10-CM | POA: Diagnosis not present

## 2011-10-30 DIAGNOSIS — I318 Other specified diseases of pericardium: Secondary | ICD-10-CM | POA: Diagnosis not present

## 2011-11-05 DIAGNOSIS — I319 Disease of pericardium, unspecified: Secondary | ICD-10-CM | POA: Diagnosis not present

## 2011-11-28 DIAGNOSIS — Z23 Encounter for immunization: Secondary | ICD-10-CM | POA: Diagnosis not present

## 2011-11-28 DIAGNOSIS — H6120 Impacted cerumen, unspecified ear: Secondary | ICD-10-CM | POA: Diagnosis not present

## 2011-11-28 DIAGNOSIS — H903 Sensorineural hearing loss, bilateral: Secondary | ICD-10-CM | POA: Diagnosis not present

## 2012-01-14 DIAGNOSIS — R609 Edema, unspecified: Secondary | ICD-10-CM | POA: Diagnosis not present

## 2012-01-14 DIAGNOSIS — R7309 Other abnormal glucose: Secondary | ICD-10-CM | POA: Diagnosis not present

## 2012-01-14 DIAGNOSIS — M1990 Unspecified osteoarthritis, unspecified site: Secondary | ICD-10-CM | POA: Diagnosis not present

## 2012-01-14 DIAGNOSIS — I319 Disease of pericardium, unspecified: Secondary | ICD-10-CM | POA: Diagnosis not present

## 2012-01-14 DIAGNOSIS — I10 Essential (primary) hypertension: Secondary | ICD-10-CM | POA: Diagnosis not present

## 2012-01-28 ENCOUNTER — Other Ambulatory Visit (HOSPITAL_COMMUNITY): Payer: Self-pay | Admitting: Cardiovascular Disease

## 2012-01-28 DIAGNOSIS — I318 Other specified diseases of pericardium: Secondary | ICD-10-CM

## 2012-04-22 ENCOUNTER — Ambulatory Visit (HOSPITAL_COMMUNITY)
Admission: RE | Admit: 2012-04-22 | Discharge: 2012-04-22 | Disposition: A | Discharge status: Home / Self Care | Payer: Medicare Other | Source: Ambulatory Visit | Attending: Cardiovascular Disease | Admitting: Cardiovascular Disease

## 2012-04-22 DIAGNOSIS — I319 Disease of pericardium, unspecified: Secondary | ICD-10-CM | POA: Insufficient documentation

## 2012-04-22 DIAGNOSIS — I318 Other specified diseases of pericardium: Secondary | ICD-10-CM

## 2012-04-22 NOTE — Progress Notes (Signed)
[facility]   2D echo completed 04/22/2012.   Cindy Vash Quezada, RDCS  

## 2012-04-23 DIAGNOSIS — L570 Actinic keratosis: Secondary | ICD-10-CM | POA: Diagnosis not present

## 2012-04-23 DIAGNOSIS — L821 Other seborrheic keratosis: Secondary | ICD-10-CM | POA: Diagnosis not present

## 2012-04-23 DIAGNOSIS — D239 Other benign neoplasm of skin, unspecified: Secondary | ICD-10-CM | POA: Diagnosis not present

## 2012-04-23 DIAGNOSIS — D235 Other benign neoplasm of skin of trunk: Secondary | ICD-10-CM | POA: Diagnosis not present

## 2012-04-23 DIAGNOSIS — L259 Unspecified contact dermatitis, unspecified cause: Secondary | ICD-10-CM | POA: Diagnosis not present

## 2012-04-23 DIAGNOSIS — Z85828 Personal history of other malignant neoplasm of skin: Secondary | ICD-10-CM | POA: Diagnosis not present

## 2012-05-22 DIAGNOSIS — H43819 Vitreous degeneration, unspecified eye: Secondary | ICD-10-CM | POA: Diagnosis not present

## 2012-05-22 DIAGNOSIS — H04229 Epiphora due to insufficient drainage, unspecified lacrimal gland: Secondary | ICD-10-CM | POA: Diagnosis not present

## 2012-05-22 DIAGNOSIS — H01009 Unspecified blepharitis unspecified eye, unspecified eyelid: Secondary | ICD-10-CM | POA: Diagnosis not present

## 2012-05-22 DIAGNOSIS — Z961 Presence of intraocular lens: Secondary | ICD-10-CM | POA: Diagnosis not present

## 2012-07-09 DIAGNOSIS — Z125 Encounter for screening for malignant neoplasm of prostate: Secondary | ICD-10-CM | POA: Diagnosis not present

## 2012-07-09 DIAGNOSIS — I10 Essential (primary) hypertension: Secondary | ICD-10-CM | POA: Diagnosis not present

## 2012-07-16 DIAGNOSIS — M1990 Unspecified osteoarthritis, unspecified site: Secondary | ICD-10-CM | POA: Diagnosis not present

## 2012-07-16 DIAGNOSIS — Z1331 Encounter for screening for depression: Secondary | ICD-10-CM | POA: Diagnosis not present

## 2012-07-16 DIAGNOSIS — Z0000 Encounter for general adult medical examination without abnormal findings: Secondary | ICD-10-CM | POA: Diagnosis not present

## 2012-07-16 DIAGNOSIS — N138 Other obstructive and reflux uropathy: Secondary | ICD-10-CM | POA: Diagnosis not present

## 2012-07-16 DIAGNOSIS — N401 Enlarged prostate with lower urinary tract symptoms: Secondary | ICD-10-CM | POA: Diagnosis not present

## 2012-07-16 DIAGNOSIS — I319 Disease of pericardium, unspecified: Secondary | ICD-10-CM | POA: Diagnosis not present

## 2012-07-16 DIAGNOSIS — Z6827 Body mass index (BMI) 27.0-27.9, adult: Secondary | ICD-10-CM | POA: Diagnosis not present

## 2012-07-16 DIAGNOSIS — Z125 Encounter for screening for malignant neoplasm of prostate: Secondary | ICD-10-CM | POA: Diagnosis not present

## 2012-07-16 DIAGNOSIS — I10 Essential (primary) hypertension: Secondary | ICD-10-CM | POA: Diagnosis not present

## 2012-07-17 DIAGNOSIS — Z1212 Encounter for screening for malignant neoplasm of rectum: Secondary | ICD-10-CM | POA: Diagnosis not present

## 2012-09-09 DIAGNOSIS — H6120 Impacted cerumen, unspecified ear: Secondary | ICD-10-CM | POA: Diagnosis not present

## 2012-09-21 ENCOUNTER — Encounter: Payer: Self-pay | Admitting: *Deleted

## 2012-09-24 ENCOUNTER — Encounter: Payer: Self-pay | Admitting: Cardiovascular Disease

## 2012-09-25 ENCOUNTER — Encounter: Payer: Self-pay | Admitting: Cardiovascular Disease

## 2012-09-25 ENCOUNTER — Ambulatory Visit (INDEPENDENT_AMBULATORY_CARE_PROVIDER_SITE_OTHER): Payer: Medicare Other | Admitting: Cardiovascular Disease

## 2012-09-25 VITALS — BP 152/88 | HR 58 | Ht 75.0 in | Wt 215.2 lb

## 2012-09-25 DIAGNOSIS — I313 Pericardial effusion (noninflammatory): Secondary | ICD-10-CM

## 2012-09-25 DIAGNOSIS — I4510 Unspecified right bundle-branch block: Secondary | ICD-10-CM | POA: Diagnosis not present

## 2012-09-25 DIAGNOSIS — I10 Essential (primary) hypertension: Secondary | ICD-10-CM | POA: Diagnosis not present

## 2012-09-25 DIAGNOSIS — I319 Disease of pericardium, unspecified: Secondary | ICD-10-CM | POA: Diagnosis not present

## 2012-09-25 NOTE — Patient Instructions (Addendum)
Your physician recommends that you schedule a follow-up appointment in: One year.  

## 2012-09-30 DIAGNOSIS — I4510 Unspecified right bundle-branch block: Secondary | ICD-10-CM | POA: Insufficient documentation

## 2012-09-30 DIAGNOSIS — I10 Essential (primary) hypertension: Secondary | ICD-10-CM | POA: Insufficient documentation

## 2012-09-30 DIAGNOSIS — I313 Pericardial effusion (noninflammatory): Secondary | ICD-10-CM | POA: Insufficient documentation

## 2012-09-30 DIAGNOSIS — I3139 Other pericardial effusion (noninflammatory): Secondary | ICD-10-CM | POA: Insufficient documentation

## 2012-09-30 HISTORY — DX: Essential (primary) hypertension: I10

## 2012-09-30 NOTE — Assessment & Plan Note (Addendum)
Slightly elevated today, but usually good control. Avoid increasing negative chronotropic agents any further.

## 2012-09-30 NOTE — Assessment & Plan Note (Signed)
Small to moderate, hemodynamically not significant, etiology uncertain after extensive workup, unresponsive to treatment with diuretics or colchicine. Monitor on a yearly basis.

## 2012-09-30 NOTE — Assessment & Plan Note (Signed)
He has evidence of cardiac conduction system disease but has never had symptoms to suggest high-grade AV block. Monitor only.

## 2012-09-30 NOTE — Progress Notes (Signed)
Patient ID: Terry Gregory, male   DOB: 08-24-1921, 79 y.o.   MRN: [id number]     Reason for office visit Pericardial effusion  Mr. Villalva is a total gentleman who had an asymptomatic pericardial effusion incidentally discovered 18 months ago. Serial echocardiograms have shown no change in the effusion size (mild to moderate) and there has never been evidence of tamponade either by clinical or echo criteria. Workup with serological studies for autoimmune disease and a CT scan of the chest failed to identify an etiology. The sedimentation rate was only marginally elevated at 25 mm/h suggesting noninflammatory etiology. Empirical treatment with colchicine and separate lead with diuretics failed to lead to resolution of the effusion.  He continues to feel well.    Allergies  Allergen Reactions  . Erythromycin Hives and Rash  . Penicillins Hives and Rash    Current Outpatient Prescriptions  Medication Sig Dispense Refill  . docusate sodium (COLACE) 100 MG capsule Take 100 mg by mouth 2 (two) times daily.      . meloxicam (MOBIC) 7.5 MG tablet Take 7.5-15 mg by mouth daily.       . metoprolol succinate (TOPROL-XL) 50 MG 24 hr tablet Take 50 mg by mouth daily. Take with or immediately following a meal.       No current facility-administered medications for this visit.    Past Medical History  Diagnosis Date  . Pericardial effusion   . Diverticulosis   . BPH (benign prostatic hyperplasia)     Past Surgical History  Procedure Laterality Date  . Appendectomy  1960  . Inguinal hernia repair  1980's  . Cholecystectomy  2000  . Total hip arthroplasty  2003    right  . Cataract extraction  2009    both eyes  . Wrist surgery  2009    left    Family History  Problem Relation Age of Onset  . Heart attack Father   . Stroke Mother   . Heart failure Brother   . Stroke Brother   . Diabetes Sister   . Cancer Sister     History   Social History  . Marital Status: Married   Spouse Name: N/A    Number of Children: N/A  . Years of Education: N/A   Occupational History  . Not on file.   Social History Main Topics  . Smoking status: Never Smoker   . Smokeless tobacco: Never Used  . Alcohol Use: No  . Drug Use: No  . Sexual Activity: Not on file   Other Topics Concern  . Not on file   Social History Narrative  . No narrative on file    Review of systems: The patient specifically denies any chest pain at rest or with exertion, dyspnea at rest or with exertion, orthopnea, paroxysmal nocturnal dyspnea, syncope, palpitations, focal neurological deficits, intermittent claudication, lower extremity edema, unexplained weight gain, cough, hemoptysis or wheezing.  The patient also denies abdominal pain, nausea, vomiting, dysphagia, diarrhea, constipation, polyuria, polydipsia, dysuria, hematuria, frequency, urgency, abnormal bleeding or bruising, fever, chills, unexpected weight changes, mood swings, change in skin or hair texture, change in voice quality, auditory or visual problems, allergic reactions or rashes, new musculoskeletal complaints other than usual "aches and pains".   PHYSICAL EXAM BP 152/88  Pulse 58  Ht 6\' 3"  (1.905 m)  Wt 215 lb 3.2 oz (97.614 kg)  BMI 26.9 kg/m2 No evidence of pulsus paradoxus. General: Alert, oriented x3, no distress Head: no evidence of trauma, PERRL, EOMI,  no exophtalmos or lid lag, no myxedema, no xanthelasma; normal ears, nose and oropharynx Neck: normal jugular venous pulsations and no hepatojugular reflux; brisk carotid pulses without delay and no carotid bruits . Kussmaul sign is negative. Chest: clear to auscultation, no signs of consolidation by percussion or palpation, normal fremitus, symmetrical and full respiratory excursions Cardiovascular: normal position and quality of the apical impulse, regular rhythm, normal first and second heart sounds, no murmurs, rubs or gallops Abdomen: no tenderness or distention, no  masses by palpation, no abnormal pulsatility or arterial bruits, normal bowel sounds, no hepatosplenomegaly Extremities: no clubbing, cyanosis or edema; 2+ radial, ulnar and brachial pulses bilaterally; 2+ right femoral, posterior tibial and dorsalis pedis pulses; 2+ left femoral, posterior tibial and dorsalis pedis pulses; no subclavian or femoral bruits Neurological: grossly nonfocal   EKG: Mild sinus bradycardia with first degree AV block and chronic right bundle branch block  BMET    Component Value Date/Time   NA 139 03/09/2010 1500   K 4.8 03/09/2010 1500   CL 103 03/09/2010 1500   CO2 30 03/09/2010 1500   GLUCOSE 122* 03/09/2010 1500   BUN 21 03/09/2010 1500   CREATININE 0.89 03/09/2010 1500   CALCIUM 9.4 03/09/2010 1500   GFRNONAA >60 03/09/2010 1500   GFRAA  Value: >60        The eGFR has been calculated using the MDRD equation. This calculation has not been validated in all clinical situations. eGFR's persistently <60 mL/min signify possible Chronic Kidney Disease. 03/09/2010 1500     ASSESSMENT AND PLAN Pericardial effusion Small to moderate, hemodynamically not significant, etiology uncertain after extensive workup, unresponsive to treatment with diuretics or colchicine. Monitor on a yearly basis.  RBBB and first degree atrioventricular block He has evidence of cardiac conduction system disease but has never had symptoms to suggest high-grade AV block. Monitor only.  HTN (hypertension) Slightly elevated today, but usually good control. Avoid increasing negative chronotropic agents any further.   Orders Placed This Encounter  Procedures  . EKG 12-Lead    Lisa Milian  Thurmon Fair, MD, [facility] [phone] office [phone] pager

## 2012-12-04 DIAGNOSIS — Z23 Encounter for immunization: Secondary | ICD-10-CM | POA: Diagnosis not present

## 2013-01-14 DIAGNOSIS — Z6827 Body mass index (BMI) 27.0-27.9, adult: Secondary | ICD-10-CM | POA: Diagnosis not present

## 2013-01-14 DIAGNOSIS — I319 Disease of pericardium, unspecified: Secondary | ICD-10-CM | POA: Diagnosis not present

## 2013-01-14 DIAGNOSIS — I10 Essential (primary) hypertension: Secondary | ICD-10-CM | POA: Diagnosis not present

## 2013-01-14 DIAGNOSIS — R7309 Other abnormal glucose: Secondary | ICD-10-CM | POA: Diagnosis not present

## 2013-01-14 DIAGNOSIS — M1990 Unspecified osteoarthritis, unspecified site: Secondary | ICD-10-CM | POA: Diagnosis not present

## 2013-01-14 DIAGNOSIS — N401 Enlarged prostate with lower urinary tract symptoms: Secondary | ICD-10-CM | POA: Diagnosis not present

## 2013-01-14 DIAGNOSIS — N138 Other obstructive and reflux uropathy: Secondary | ICD-10-CM | POA: Diagnosis not present

## 2013-03-06 ENCOUNTER — Other Ambulatory Visit (HOSPITAL_COMMUNITY): Payer: Self-pay | Admitting: Internal Medicine

## 2013-03-06 ENCOUNTER — Ambulatory Visit (HOSPITAL_COMMUNITY)
Admission: RE | Admit: 2013-03-06 | Discharge: 2013-03-06 | Disposition: A | Discharge status: Home / Self Care | Payer: Medicare Other | Source: Ambulatory Visit | Attending: Vascular Surgery | Admitting: Vascular Surgery

## 2013-03-06 DIAGNOSIS — M7989 Other specified soft tissue disorders: Secondary | ICD-10-CM

## 2013-03-06 DIAGNOSIS — R609 Edema, unspecified: Secondary | ICD-10-CM | POA: Insufficient documentation

## 2013-03-06 DIAGNOSIS — I10 Essential (primary) hypertension: Secondary | ICD-10-CM | POA: Diagnosis not present

## 2013-03-06 DIAGNOSIS — Z6827 Body mass index (BMI) 27.0-27.9, adult: Secondary | ICD-10-CM | POA: Diagnosis not present

## 2013-03-06 DIAGNOSIS — I8310 Varicose veins of unspecified lower extremity with inflammation: Secondary | ICD-10-CM | POA: Diagnosis not present

## 2013-03-06 DIAGNOSIS — R319 Hematuria, unspecified: Secondary | ICD-10-CM | POA: Diagnosis not present

## 2013-04-21 ENCOUNTER — Other Ambulatory Visit: Payer: Self-pay | Admitting: Dermatology

## 2013-04-21 DIAGNOSIS — L821 Other seborrheic keratosis: Secondary | ICD-10-CM | POA: Diagnosis not present

## 2013-04-21 DIAGNOSIS — Z85828 Personal history of other malignant neoplasm of skin: Secondary | ICD-10-CM | POA: Diagnosis not present

## 2013-04-21 DIAGNOSIS — L739 Follicular disorder, unspecified: Secondary | ICD-10-CM | POA: Diagnosis not present

## 2013-04-21 DIAGNOSIS — D485 Neoplasm of uncertain behavior of skin: Secondary | ICD-10-CM | POA: Diagnosis not present

## 2013-04-21 DIAGNOSIS — L819 Disorder of pigmentation, unspecified: Secondary | ICD-10-CM | POA: Diagnosis not present

## 2013-04-21 DIAGNOSIS — C44529 Squamous cell carcinoma of skin of other part of trunk: Secondary | ICD-10-CM | POA: Diagnosis not present

## 2013-04-21 DIAGNOSIS — D239 Other benign neoplasm of skin, unspecified: Secondary | ICD-10-CM | POA: Diagnosis not present

## 2013-04-24 DIAGNOSIS — H6120 Impacted cerumen, unspecified ear: Secondary | ICD-10-CM | POA: Diagnosis not present

## 2013-04-24 DIAGNOSIS — H903 Sensorineural hearing loss, bilateral: Secondary | ICD-10-CM | POA: Diagnosis not present

## 2013-05-05 ENCOUNTER — Other Ambulatory Visit: Payer: Self-pay | Admitting: Dermatology

## 2013-05-05 DIAGNOSIS — Z85828 Personal history of other malignant neoplasm of skin: Secondary | ICD-10-CM | POA: Diagnosis not present

## 2013-05-05 DIAGNOSIS — C44529 Squamous cell carcinoma of skin of other part of trunk: Secondary | ICD-10-CM | POA: Diagnosis not present

## 2013-05-20 ENCOUNTER — Other Ambulatory Visit: Payer: Self-pay | Admitting: Dermatology

## 2013-05-20 DIAGNOSIS — D485 Neoplasm of uncertain behavior of skin: Secondary | ICD-10-CM | POA: Diagnosis not present

## 2013-05-20 DIAGNOSIS — Z85828 Personal history of other malignant neoplasm of skin: Secondary | ICD-10-CM | POA: Diagnosis not present

## 2013-05-20 DIAGNOSIS — L905 Scar conditions and fibrosis of skin: Secondary | ICD-10-CM | POA: Diagnosis not present

## 2013-05-25 DIAGNOSIS — H02059 Trichiasis without entropian unspecified eye, unspecified eyelid: Secondary | ICD-10-CM | POA: Diagnosis not present

## 2013-05-25 DIAGNOSIS — H43819 Vitreous degeneration, unspecified eye: Secondary | ICD-10-CM | POA: Diagnosis not present

## 2013-05-25 DIAGNOSIS — Z961 Presence of intraocular lens: Secondary | ICD-10-CM | POA: Diagnosis not present

## 2013-05-25 DIAGNOSIS — H01009 Unspecified blepharitis unspecified eye, unspecified eyelid: Secondary | ICD-10-CM | POA: Diagnosis not present

## 2013-07-17 DIAGNOSIS — Z79899 Other long term (current) drug therapy: Secondary | ICD-10-CM | POA: Diagnosis not present

## 2013-07-17 DIAGNOSIS — Z125 Encounter for screening for malignant neoplasm of prostate: Secondary | ICD-10-CM | POA: Diagnosis not present

## 2013-07-17 DIAGNOSIS — I10 Essential (primary) hypertension: Secondary | ICD-10-CM | POA: Diagnosis not present

## 2013-07-17 DIAGNOSIS — R7309 Other abnormal glucose: Secondary | ICD-10-CM | POA: Diagnosis not present

## 2013-07-24 DIAGNOSIS — I872 Venous insufficiency (chronic) (peripheral): Secondary | ICD-10-CM | POA: Diagnosis not present

## 2013-07-24 DIAGNOSIS — I10 Essential (primary) hypertension: Secondary | ICD-10-CM | POA: Diagnosis not present

## 2013-07-24 DIAGNOSIS — N401 Enlarged prostate with lower urinary tract symptoms: Secondary | ICD-10-CM | POA: Diagnosis not present

## 2013-07-24 DIAGNOSIS — R7309 Other abnormal glucose: Secondary | ICD-10-CM | POA: Diagnosis not present

## 2013-07-24 DIAGNOSIS — M1990 Unspecified osteoarthritis, unspecified site: Secondary | ICD-10-CM | POA: Diagnosis not present

## 2013-07-24 DIAGNOSIS — N138 Other obstructive and reflux uropathy: Secondary | ICD-10-CM | POA: Diagnosis not present

## 2013-07-24 DIAGNOSIS — Z0000 Encounter for general adult medical examination without abnormal findings: Secondary | ICD-10-CM | POA: Diagnosis not present

## 2013-07-28 DIAGNOSIS — M25562 Pain in left knee: Secondary | ICD-10-CM | POA: Insufficient documentation

## 2013-07-28 DIAGNOSIS — M25569 Pain in unspecified knee: Secondary | ICD-10-CM | POA: Diagnosis not present

## 2013-07-28 DIAGNOSIS — M25561 Pain in right knee: Secondary | ICD-10-CM | POA: Insufficient documentation

## 2013-07-30 DIAGNOSIS — Z1212 Encounter for screening for malignant neoplasm of rectum: Secondary | ICD-10-CM | POA: Diagnosis not present

## 2013-08-11 DIAGNOSIS — R262 Difficulty in walking, not elsewhere classified: Secondary | ICD-10-CM | POA: Diagnosis not present

## 2013-08-11 DIAGNOSIS — M25569 Pain in unspecified knee: Secondary | ICD-10-CM | POA: Diagnosis not present

## 2013-09-28 ENCOUNTER — Encounter: Payer: Self-pay | Admitting: Cardiovascular Disease

## 2013-09-28 ENCOUNTER — Ambulatory Visit (INDEPENDENT_AMBULATORY_CARE_PROVIDER_SITE_OTHER): Payer: Medicare Other | Admitting: Cardiovascular Disease

## 2013-09-28 VITALS — BP 152/80 | HR 59 | Resp 16

## 2013-09-28 DIAGNOSIS — I3139 Other pericardial effusion (noninflammatory): Secondary | ICD-10-CM

## 2013-09-28 DIAGNOSIS — I10 Essential (primary) hypertension: Secondary | ICD-10-CM | POA: Diagnosis not present

## 2013-09-28 DIAGNOSIS — I319 Disease of pericardium, unspecified: Secondary | ICD-10-CM

## 2013-09-28 DIAGNOSIS — I313 Pericardial effusion (noninflammatory): Secondary | ICD-10-CM

## 2013-09-28 DIAGNOSIS — I4510 Unspecified right bundle-branch block: Secondary | ICD-10-CM

## 2013-09-28 NOTE — Patient Instructions (Signed)
Your physician has requested that you have an echocardiogram March 2016. Echocardiography is a painless test that uses sound waves to create images of your heart. It provides your doctor with information about the size and shape of your heart and how well your heart's chambers and valves are working. This procedure takes approximately one hour. There are no restrictions for this procedure.  Dr. Sallyanne Kuster recommends that you schedule a follow-up appointment in: One year.

## 2013-09-28 NOTE — Progress Notes (Signed)
Patient ID: Terry Gregory, male   DOB: [birth year], 80 y.o.   MRN: [id number]     Reason for office visit Asymptomatic pericardial effusion  Terry Gregory returns in followup for an incidentally diagnosed moderate sized pericardial effusion discovered almost 3 years ago. It has remained stable in size on serial echocardiograms and has never had findings to suggest temporal not. Laboratory tests chest CT and other imaging studies have failed to identify possible etiology. There was minimum evidence for systemic inflammation and empirical treatment with colchicine did not lead to any improvement in effusion size. Diuretic therapy did not help either. Never really had any other findings to suggest congestive heart failure. Otherwise his echocardiogram is normal.  He has no complaints today.  Allergies  Allergen Reactions  . Erythromycin Hives and Rash  . Penicillins Hives and Rash    Current Outpatient Prescriptions  Medication Sig Dispense Refill  . fexofenadine (ALLEGRA) 180 MG tablet Take 180 mg by mouth daily.      . meloxicam (MOBIC) 7.5 MG tablet Take 7.5-15 mg by mouth daily.       . metoprolol succinate (TOPROL-XL) 50 MG 24 hr tablet Take 50 mg by mouth daily. Take with or immediately following a meal.      . polyethylene glycol (MIRALAX / GLYCOLAX) packet Take 17 g by mouth daily.       No current facility-administered medications for this visit.    Past Medical History  Diagnosis Date  . Pericardial effusion   . Diverticulosis   . BPH (benign prostatic hyperplasia)     Past Surgical History  Procedure Laterality Date  . Appendectomy  1960  . Inguinal hernia repair  1980's  . Cholecystectomy  2000  . Total hip arthroplasty  2003    right  . Cataract extraction  2009    both eyes  . Wrist surgery  2009    left    Family History  Problem Relation Age of Onset  . Heart attack Father   . Stroke Mother   . Heart failure Brother   . Stroke Brother   . Diabetes Sister     . Cancer Sister     History   Social History  . Marital Status: Married    Spouse Name: N/A    Number of Children: N/A  . Years of Education: N/A   Occupational History  . Not on file.   Social History Main Topics  . Smoking status: Never Smoker   . Smokeless tobacco: Never Used  . Alcohol Use: No  . Drug Use: No  . Sexual Activity: Not on file   Other Topics Concern  . Not on file   Social History Narrative  . No narrative on file    Review of systems: The patient specifically denies any chest pain at rest or with exertion, dyspnea at rest or with exertion, orthopnea, paroxysmal nocturnal dyspnea, syncope, palpitations, focal neurological deficits, intermittent claudication, lower extremity edema, unexplained weight gain, cough, hemoptysis or wheezing.  The patient also denies abdominal pain, nausea, vomiting, dysphagia, diarrhea, constipation, polyuria, polydipsia, dysuria, hematuria, frequency, urgency, abnormal bleeding or bruising, fever, chills, unexpected weight changes, mood swings, change in skin or hair texture, change in voice quality, auditory or visual problems, allergic reactions or rashes, new musculoskeletal complaints other than usual "aches and pains".   PHYSICAL EXAM BP 152/80  Pulse 59  Resp 16 Her home blood pressures usually lower General: Alert, oriented x3, no distress Head: no evidence of trauma,  PERRL, EOMI, no exophtalmos or lid lag, no myxedema, no xanthelasma; normal ears, nose and oropharynx Neck: normal jugular venous pulsations and no hepatojugular reflux; brisk carotid pulses without delay and no carotid bruits Chest: clear to auscultation, no signs of consolidation by percussion or palpation, normal fremitus, symmetrical and full respiratory excursions Cardiovascular: normal position and quality of the apical impulse, regular rhythm, normal first and second heart sounds, no murmurs, rubs or gallops Abdomen: no tenderness or distention,  no masses by palpation, no abnormal pulsatility or arterial bruits, normal bowel sounds, no hepatosplenomegaly Extremities: no clubbing, cyanosis or edema; 2+ radial, ulnar and brachial pulses bilaterally; 2+ right femoral, posterior tibial and dorsalis pedis pulses; 2+ left femoral, posterior tibial and dorsalis pedis pulses; no subclavian or femoral bruits Neurological: grossly nonfocal   EKG: Sinus bradycardia, first degree AV block, occasional PVCs, right bundle branch block  Lipid Panel  No results found for this basename: chol, trig, hdl, cholhdl, vldl, ldlcalc    BMET    Component Value Date/Time   NA 139 03/09/2010 1500   K 4.8 03/09/2010 1500   CL 103 03/09/2010 1500   CO2 30 03/09/2010 1500   GLUCOSE 122* 03/09/2010 1500   BUN 21 03/09/2010 1500   CREATININE 0.89 03/09/2010 1500   CALCIUM 9.4 03/09/2010 1500   GFRNONAA >60 03/09/2010 1500   GFRAA  Value: >60        The eGFR has been calculated using the MDRD equation. This calculation has not been validated in all clinical situations. eGFR's persistently <60 mL/min signify possible Chronic Kidney Disease. 03/09/2010 1500     ASSESSMENT AND PLAN  Terry Gregory remains completely asymptomatic as far as the pericardial effusion is concerned. We'll plan to reevaluate by echocardiography in March, one year from his last echocardiogram. It is hard to justify aggressive evaluation, such as pericardiocentesis in this otherwise healthy 80 year old man without other findings to suggest malignancy or inflammatory disorders.  Meds ordered this encounter  Medications  . polyethylene glycol (MIRALAX / GLYCOLAX) packet    Sig: Take 17 g by mouth daily.  . fexofenadine (ALLEGRA) 180 MG tablet    Sig: Take 180 mg by mouth daily.    Holli Humbles, MD, [place] [phone] office [phone] pager

## 2013-12-15 DIAGNOSIS — Z23 Encounter for immunization: Secondary | ICD-10-CM | POA: Diagnosis not present

## 2014-01-21 DIAGNOSIS — M1990 Unspecified osteoarthritis, unspecified site: Secondary | ICD-10-CM | POA: Diagnosis not present

## 2014-01-21 DIAGNOSIS — I10 Essential (primary) hypertension: Secondary | ICD-10-CM | POA: Diagnosis not present

## 2014-01-21 DIAGNOSIS — R7301 Impaired fasting glucose: Secondary | ICD-10-CM | POA: Diagnosis not present

## 2014-01-21 DIAGNOSIS — Z6827 Body mass index (BMI) 27.0-27.9, adult: Secondary | ICD-10-CM | POA: Diagnosis not present

## 2014-02-19 DIAGNOSIS — S8992XA Unspecified injury of left lower leg, initial encounter: Secondary | ICD-10-CM | POA: Diagnosis not present

## 2014-02-19 DIAGNOSIS — Z6827 Body mass index (BMI) 27.0-27.9, adult: Secondary | ICD-10-CM | POA: Diagnosis not present

## 2014-02-19 DIAGNOSIS — R609 Edema, unspecified: Secondary | ICD-10-CM | POA: Diagnosis not present

## 2014-04-28 ENCOUNTER — Ambulatory Visit (HOSPITAL_COMMUNITY)
Admission: RE | Admit: 2014-04-28 | Discharge: 2014-04-28 | Disposition: A | Discharge status: Home / Self Care | Payer: Medicare Other | Source: Ambulatory Visit | Attending: Cardiovascular Disease | Admitting: Cardiovascular Disease

## 2014-04-28 DIAGNOSIS — I8312 Varicose veins of left lower extremity with inflammation: Secondary | ICD-10-CM | POA: Diagnosis not present

## 2014-04-28 DIAGNOSIS — D2262 Melanocytic nevi of left upper limb, including shoulder: Secondary | ICD-10-CM | POA: Diagnosis not present

## 2014-04-28 DIAGNOSIS — D225 Melanocytic nevi of trunk: Secondary | ICD-10-CM | POA: Diagnosis not present

## 2014-04-28 DIAGNOSIS — I319 Disease of pericardium, unspecified: Secondary | ICD-10-CM

## 2014-04-28 DIAGNOSIS — L821 Other seborrheic keratosis: Secondary | ICD-10-CM | POA: Diagnosis not present

## 2014-04-28 DIAGNOSIS — I313 Pericardial effusion (noninflammatory): Secondary | ICD-10-CM

## 2014-04-28 DIAGNOSIS — I872 Venous insufficiency (chronic) (peripheral): Secondary | ICD-10-CM | POA: Diagnosis not present

## 2014-04-28 DIAGNOSIS — Z85828 Personal history of other malignant neoplasm of skin: Secondary | ICD-10-CM | POA: Diagnosis not present

## 2014-04-28 DIAGNOSIS — D2271 Melanocytic nevi of right lower limb, including hip: Secondary | ICD-10-CM | POA: Diagnosis not present

## 2014-04-28 DIAGNOSIS — I3139 Other pericardial effusion (noninflammatory): Secondary | ICD-10-CM

## 2014-04-28 DIAGNOSIS — I8311 Varicose veins of right lower extremity with inflammation: Secondary | ICD-10-CM | POA: Diagnosis not present

## 2014-04-28 NOTE — Progress Notes (Signed)
Limited 2D Echocardiogram Complete for Pericardial Effusion.  04/28/2014   Terry Gregory [place], [place]

## 2014-05-10 DIAGNOSIS — H15001 Unspecified scleritis, right eye: Secondary | ICD-10-CM | POA: Diagnosis not present

## 2014-05-10 DIAGNOSIS — H15011 Anterior scleritis, right eye: Secondary | ICD-10-CM | POA: Diagnosis not present

## 2014-05-12 DIAGNOSIS — H903 Sensorineural hearing loss, bilateral: Secondary | ICD-10-CM | POA: Diagnosis not present

## 2014-05-17 DIAGNOSIS — H15001 Unspecified scleritis, right eye: Secondary | ICD-10-CM | POA: Diagnosis not present

## 2014-05-20 DIAGNOSIS — H6123 Impacted cerumen, bilateral: Secondary | ICD-10-CM | POA: Diagnosis not present

## 2014-05-31 DIAGNOSIS — H15001 Unspecified scleritis, right eye: Secondary | ICD-10-CM | POA: Diagnosis not present

## 2014-06-15 DIAGNOSIS — R310 Gross hematuria: Secondary | ICD-10-CM | POA: Diagnosis not present

## 2014-06-23 DIAGNOSIS — R310 Gross hematuria: Secondary | ICD-10-CM | POA: Diagnosis not present

## 2014-06-23 DIAGNOSIS — R312 Other microscopic hematuria: Secondary | ICD-10-CM | POA: Diagnosis not present

## 2014-06-29 DIAGNOSIS — N401 Enlarged prostate with lower urinary tract symptoms: Secondary | ICD-10-CM | POA: Diagnosis not present

## 2014-06-29 DIAGNOSIS — R310 Gross hematuria: Secondary | ICD-10-CM | POA: Diagnosis not present

## 2014-07-21 DIAGNOSIS — I129 Hypertensive chronic kidney disease with stage 1 through stage 4 chronic kidney disease, or unspecified chronic kidney disease: Secondary | ICD-10-CM | POA: Diagnosis not present

## 2014-07-21 DIAGNOSIS — I10 Essential (primary) hypertension: Secondary | ICD-10-CM | POA: Diagnosis not present

## 2014-07-21 DIAGNOSIS — R8290 Unspecified abnormal findings in urine: Secondary | ICD-10-CM | POA: Diagnosis not present

## 2014-07-21 DIAGNOSIS — Z125 Encounter for screening for malignant neoplasm of prostate: Secondary | ICD-10-CM | POA: Diagnosis not present

## 2014-07-21 DIAGNOSIS — R7301 Impaired fasting glucose: Secondary | ICD-10-CM | POA: Diagnosis not present

## 2014-07-22 DIAGNOSIS — H6122 Impacted cerumen, left ear: Secondary | ICD-10-CM | POA: Diagnosis not present

## 2014-07-28 DIAGNOSIS — R2681 Unsteadiness on feet: Secondary | ICD-10-CM | POA: Diagnosis not present

## 2014-07-28 DIAGNOSIS — Z0000 Encounter for general adult medical examination without abnormal findings: Secondary | ICD-10-CM | POA: Diagnosis not present

## 2014-07-28 DIAGNOSIS — N401 Enlarged prostate with lower urinary tract symptoms: Secondary | ICD-10-CM | POA: Diagnosis not present

## 2014-07-28 DIAGNOSIS — M1990 Unspecified osteoarthritis, unspecified site: Secondary | ICD-10-CM | POA: Diagnosis not present

## 2014-07-28 DIAGNOSIS — I10 Essential (primary) hypertension: Secondary | ICD-10-CM | POA: Diagnosis not present

## 2014-07-28 DIAGNOSIS — Z1212 Encounter for screening for malignant neoplasm of rectum: Secondary | ICD-10-CM | POA: Diagnosis not present

## 2014-07-28 DIAGNOSIS — Z6827 Body mass index (BMI) 27.0-27.9, adult: Secondary | ICD-10-CM | POA: Diagnosis not present

## 2014-07-28 DIAGNOSIS — Z23 Encounter for immunization: Secondary | ICD-10-CM | POA: Diagnosis not present

## 2014-07-28 DIAGNOSIS — Z1389 Encounter for screening for other disorder: Secondary | ICD-10-CM | POA: Diagnosis not present

## 2014-07-28 DIAGNOSIS — N182 Chronic kidney disease, stage 2 (mild): Secondary | ICD-10-CM | POA: Diagnosis not present

## 2014-07-28 DIAGNOSIS — I129 Hypertensive chronic kidney disease with stage 1 through stage 4 chronic kidney disease, or unspecified chronic kidney disease: Secondary | ICD-10-CM | POA: Diagnosis not present

## 2014-08-30 ENCOUNTER — Ambulatory Visit: Payer: Medicare Other | Attending: Internal Medicine | Admitting: Physical Therapy

## 2014-08-30 DIAGNOSIS — R269 Unspecified abnormalities of gait and mobility: Secondary | ICD-10-CM | POA: Diagnosis not present

## 2014-08-30 DIAGNOSIS — M25659 Stiffness of unspecified hip, not elsewhere classified: Secondary | ICD-10-CM | POA: Diagnosis not present

## 2014-08-30 DIAGNOSIS — R2681 Unsteadiness on feet: Secondary | ICD-10-CM | POA: Insufficient documentation

## 2014-08-30 NOTE — Therapy (Signed)
[facility] [address] [place], [place], [address] Phone: [phone]   Fax:  [phone]  Physical Therapy Evaluation  Patient Details  Name: Terry Gregory MRN: [id number] Date of Birth: 10-16-1921 Referring Provider:  Burnard Bunting, MD  Encounter Date: 08/30/2014      PT End of Session - 08/30/14 2007    Visit Number 1   Number of Visits 17  eval + 16 visits   Date for PT Re-Evaluation 10/29/14   Authorization Type Medicare - G Codes every 10 visits   PT Start Time 1147   PT Stop Time 1238   PT Time Calculation (min) 51 min   Equipment Utilized During Treatment Gait belt   Activity Tolerance Patient tolerated treatment well   Behavior During Therapy [facility] for tasks assessed/performed      Past Medical History  Diagnosis Date  . Pericardial effusion   . Diverticulosis   . BPH (benign prostatic hyperplasia)     Past Surgical History  Procedure Laterality Date  . Appendectomy  1960  . Inguinal hernia repair  1980's  . Cholecystectomy  2000  . Total hip arthroplasty  2003    right  . Cataract extraction  2009    both eyes  . Wrist surgery  2009    left    There were no vitals filed for this visit.  Visit Diagnosis:  Abnormality of gait - Plan: PT plan of care cert/re-cert  Unsteadiness - Plan: PT plan of care cert/re-cert      Subjective Assessment - 08/30/14 1205    Subjective "I'm here because the doctor told me to come." MD order says that pt has fallen 4x in the past year. Upon further questioning by this PT, daughter, Terry Gregory, explained that pt has fallen once inside home, once outside home. Terry Gregory reports that pt has increased gait instability when walking over unlevel surfaces, sudden movements (direction changes), and as soon as he stands up. Terry Gregory states, "It's like, if he stumbles, he cant catch himself."  Pt also reports limited standing/walking tolerance secondary to h/o B knee OA, stating  , "It's not that they hurt, it's just that if I stand or walk too long, they seem to give out." Pt began using [facility] in June 2016, per MD recommendation.    Patient is accompained by: Family member  Daughter, Terry Gregory   Pertinent History Goes by [name]". PMH siginificant for: HTN, OA,CKD, R THR (2003)   Limitations Walking;Standing   Patient Stated Goals "I want to be able to walk further; and to be steadier on my feet."   Currently in Pain? No/denies            [facility] PT Assessment - 08/30/14 0001    Assessment   Medical Diagnosis Unsteady gait   Onset Date/Surgical Date 02/12/14   Precautions   Precautions Fall   Restrictions   Weight Bearing Restrictions No   Balance Screen   Has the patient fallen in the past 6 months Yes   How many times? 4   Has the patient had a decrease in activity level because of a fear of falling?  Yes   Is the patient reluctant to leave their home because of a fear of falling?  No   Home Social worker Private residence   Living Arrangements Spouse/significant other;Children   Type of [place] to enter   Entrance Stairs-Number of Steps 2   Entrance Stairs-Rails  Left   Home Layout One level   [place] - 2 wheels;Kasandra Knudsen - single point   Prior Function   Level of Independence Independent  started using standard cane in June 2016   Leisure [employer] basketball   Cognition   Overall Cognitive Status Within Functional Limits for tasks assessed   Observation/Other Assessments   Focus on Therapeutic Outcomes (FOTO)  Primary FOTO score: 49   Sensation   Light Touch Appears Intact   Proprioception Appears Intact   Coordination   Gross Motor Movements are Fluid and Coordinated Yes   Fine Motor Movements are Fluid and Coordinated Yes   Posture/Postural Control   Posture/Postural Control Postural limitations   Postural Limitations Decreased thoracic kyphosis;Posterior pelvic tilt   Posture Comments B  knees remain flexed during all functiojnal mobility   ROM / Strength   AROM / PROM / Strength AROM;PROM;Strength   AROM   Overall AROM  Deficits   Overall AROM Comments B knee extension limited to -3 degrees   PROM   Overall PROM  Deficits   Overall PROM Comments B knee extension limited to -3 degrees   Strength   Overall Strength Deficits   Overall Strength Comments B knees, ankles grossly WFL. R hip extension/ABD 4/5. R hip ABD 4/5   Flexibility   Soft Tissue Assessment /Muscle Length yes   Hamstrings limited extensiblity bilaterally   Transfers   Transfers Sit to Stand;Stand to Sit   Sit to Stand 6: Modified independent (Device/Increase time)   Stand to Sit 6: Modified independent (Device/Increase time)   Ambulation/Gait   Ambulation/Gait Yes   Ambulation/Gait Assistance 5: Supervision;6: Modified independent (Device/Increase time);4: Min guard   Ambulation/Gait Assistance Details Supervison to Min guard required with with turning, dual tasking, direct change, head turns   Ambulation Distance (Feet) 460 Feet   Assistive device Straight cane   Gait Pattern Step-through pattern;Right flexed knee in stance;Left flexed knee in stance;Trunk flexed;Narrow base of support;Decreased trunk rotation  Impaired sequencing of gait with SPC   Ambulation Surface Level;Indoor   Gait velocity 2.76 ft/sec   Stairs Yes   Stairs Assistance 5: Supervision   Stair Management Technique Two rails;Alternating pattern   Number of Stairs 4   Height of Stairs 6   Balance   Balance Assessed Yes   Standardized Balance Assessment   Standardized Balance Assessment Dynamic Gait Index   Dynamic Gait Index   Level Surface Mild Impairment  uses SPC   Change in Gait Speed Mild Impairment   Gait with Horizontal Head Turns Mild Impairment   Gait with Vertical Head Turns Mild Impairment   Gait and Pivot Turn Moderate Impairment   Step Over Obstacle Mild Impairment   Step Around Obstacles Mild Impairment    Steps Mild Impairment   Total Score 15                           PT Education - 08/30/14 2007    Education provided Yes   Education Details PT eval findings, goals, POC.   Person(s) Educated Patient;Child(ren)   Methods Explanation   Comprehension Verbalized understanding          PT Short Term Goals - 08/30/14 1301    PT SHORT TERM GOAL #1   Title Pt will perform HEP with mod I using paper handout to indicate safe daily compliance with home exercises. Target date: 09/27/14   Status New   PT SHORT TERM  GOAL #2   Title Pt will ambulate x150' over level, indoor surface with mod I using SPC with correct sequencing of cane with gait. Target date: 09/27/14   Status New   PT SHORT TERM GOAL #3   Title Pt will increase DGI score from 15 to 18/24 to indicate improved gait stability in the presence of external demands. Target date: 09/27/14   Status New           PT Long Term Goals - 09/11/14 1307    PT LONG TERM GOAL #1   Title Pt will verbalize understanding of fall prevention strategies to decrease fall risk within home.  Target date: 10/25/14   Status New   PT LONG TERM GOAL #2   Title Pt will increase DGI score from 15 to 20/24 to indicate decreased fall risk. Target date: 10/25/14   Status New   PT LONG TERM GOAL #3   Title Pt will ambulate 500' over unlevel asphalt and grassy surfaces with LRAD and mod I with no overt LOB. Target date: 10/25/14   Status New   PT LONG TERM GOAL #4   Title Pt will negotiate standard curb step with LRAD and mod I to indicate ability to traverse community obstacles. Target date: 10/25/14   Status New   PT LONG TERM GOAL #5   Title Pt will increase Primary FOTO score from 42 to 52 to indicate improvement in patient-perceived functional status. Target date: 10/25/14   Status New               Plan - 09-11-2014 1951    Clinical Impression Statement Pt is a 81 y/o M referred to outpatient PT to address gait instability, falls  x4 within past 6 months. PT evaluation reveals the following impairments: decreased gait stability in presence of external demands, as indicated by DGI score; gait impairments; pt difficulty sequencing SPC with gait; decreased strength in B hips; decreased muscle extensibility. Daughter reports gait instability over unlevel surfaces and pt difficulty recovering from LOB. Pt will benefit from silled PT twice per week for 8 weeks to address said impairments.   Pt will benefit from skilled therapeutic intervention in order to improve on the following deficits Decreased balance;Decreased strength;Impaired flexibility;Abnormal gait;Postural dysfunction;Decreased mobility;Decreased knowledge of use of DME;Decreased activity tolerance;Decreased coordination   Rehab Potential Good   Clinical Impairments Affecting Rehab Potential transportation limitations (daughter drives pt to appts and also assists with Hospice care for pt's wife)   PT Frequency 2x / week   PT Duration 8 weeks   PT Treatment/Interventions Vestibular;DME Instruction;Gait training;Stair training;Functional mobility training;Balance training;Therapeutic exercise;Therapeutic activities;Neuromuscular re-education;Patient/family education;Manual techniques;ADLs/Self Care Home Management   PT Next Visit Plan Establish HEP [facility] may be appropriate); hamstring/gastroc stretching. Assess community/outdoor mobility. Consider SOT using Balance Master.   Consulted and Agree with Plan of Care Patient;Family member/caregiver   Family Member Consulted daughter, Jacqualyn Posey - 09-11-14 1256    Functional Assessment Tool Used DGI score: 15/24   Functional Limitation Mobility: Walking and moving around   Mobility: Walking and Moving Around Current Status [phone]) At least 20 percent but less than 40 percent impaired, limited or restricted   Mobility: Walking and Moving Around Goal Status [phone]) At least 1 percent but less than 20 percent  impaired, limited or restricted       Problem List Patient Active Problem List   Diagnosis Date Noted  . Pericardial effusion 09/30/2012  . RBBB  and first degree atrioventricular block 09/30/2012  . HTN (hypertension) 09/30/2012    Billie Ruddy, PT, DPT [facility] [address] [place], [place], [address] Phone: [phone]   Fax:  [phone] 08/30/2014, 8:13 PM

## 2014-09-06 ENCOUNTER — Ambulatory Visit: Payer: Medicare Other | Admitting: Physical Therapy

## 2014-09-07 DIAGNOSIS — Z961 Presence of intraocular lens: Secondary | ICD-10-CM | POA: Diagnosis not present

## 2014-09-07 DIAGNOSIS — H15001 Unspecified scleritis, right eye: Secondary | ICD-10-CM | POA: Diagnosis not present

## 2014-09-08 ENCOUNTER — Ambulatory Visit: Payer: Medicare Other | Admitting: Physical Therapy

## 2014-09-08 DIAGNOSIS — M25659 Stiffness of unspecified hip, not elsewhere classified: Secondary | ICD-10-CM | POA: Diagnosis not present

## 2014-09-08 DIAGNOSIS — R2681 Unsteadiness on feet: Secondary | ICD-10-CM

## 2014-09-08 DIAGNOSIS — R269 Unspecified abnormalities of gait and mobility: Secondary | ICD-10-CM

## 2014-09-08 NOTE — Therapy (Signed)
[facility] [address] [place], [place], [address] Phone: [phone]   Fax:  [phone]  Physical Therapy Treatment  Patient Details  Name: Terry Gregory MRN: [id number] Date of Birth: Apr 30, 1921 Referring Provider:  Burnard Bunting, MD  Encounter Date: 09/08/2014      PT End of Session - 09/08/14 1606    Visit Number 2   Number of Visits 17   Date for PT Re-Evaluation 10/29/14   Authorization Type Medicare - G Codes every 10 visits   PT Start Time 1316   PT Stop Time 1400   PT Time Calculation (min) 44 min   Activity Tolerance Patient tolerated treatment well   Behavior During Therapy [facility] for tasks assessed/performed      Past Medical History  Diagnosis Date  . Pericardial effusion   . Diverticulosis   . BPH (benign prostatic hyperplasia)     Past Surgical History  Procedure Laterality Date  . Appendectomy  1960  . Inguinal hernia repair  1980's  . Cholecystectomy  2000  . Total hip arthroplasty  2003    right  . Cataract extraction  2009    both eyes  . Wrist surgery  2009    left    There were no vitals filed for this visit.  Visit Diagnosis:  Abnormality of gait  Unsteadiness  Stiffness of hip joint, unspecified laterality      Subjective Assessment - 09/08/14 1322    Subjective Pt reports no falls, no changes. When pt has sustained falls in the past (4 in the past 6 months), he has fallen forward almost every time. Pt's daughter stated, "Dad doesn't understannd that he needs to have better lighting in his house. He has fallen trying to walk around in the dark."   Patient is accompained by: Family member  daughter, Olegario Shearer   Pertinent History Goes by [name]". PMH siginificant for: HTN, OA,CKD, R THR (2003)   Limitations Walking;Standing   Patient Stated Goals "I want to be able to walk further; and to be steadier on my feet."   Currently in Pain? No/denies            [facility] PT  Assessment - 09/08/14 0001    Flexibility   Soft Tissue Assessment /Muscle Length yes   Quadriceps Thomas Test reveals decreased extensibility of R iliopsoas and rectus femoris and of L rectus femoris       Treatment   Therapeutic Exercises: - Pt performed home exercises with verbal/demonstration cueing from this therapist. See Pt Instructions for details on all home exercises, repetitions, and sets.               [facility] PT Treatment/Exercise - 09/08/14 0001    Transfers   Transfers Sit to Stand;Stand to Sit   Sit to Stand 6: Modified independent (Device/Increase time)   Stand to Sit 6: Modified independent (Device/Increase time)   Ambulation/Gait   Ambulation/Gait Yes   Ambulation/Gait Assistance 5: Supervision;6: Modified independent (Device/Increase time)   Ambulation/Gait Assistance Details Mod I with cane; supervision without cane   Ambulation Distance (Feet) 345 Feet   Assistive device Straight cane;None   Gait Pattern Step-through pattern;Right flexed knee in stance;Left flexed knee in stance;Trunk flexed;Narrow base of support;Decreased trunk rotation  Limited B hip extension throughtout gait cycle   Ambulation Surface Level;Indoor   Gait velocity --   Stairs Yes   Stairs Assistance 5: Supervision   Stair Management Technique --   Number of Stairs --  Height of Stairs --   Curb 6: Modified independent (Device/increase time)  using SPC   Posture/Postural Control   Posture/Postural Control Postural limitations   Postural Limitations Decreased thoracic kyphosis;Posterior pelvic tilt   Posture Comments B hips/knees remain flexed during all functional mobility   Manual Therapy   Manual Therapy Other (comment)   Manual therapy comments Supine, hook lying with respective LE hanging off EOM (hip extension, knee flexion), performed contract-relax PNF alternating with prolonged passive stretch of R iliopsoas x2 minutes, of B rectus femoris x2 minutes each                  PT Education - 09/08/14 1605    Education provided Yes   Education Details Fall prevention strategies. Established initial HEP; see pt instructions for details.   Person(s) Educated Patient;Child(ren)   Methods Explanation;Demonstration;Verbal cues;Handout   Comprehension Verbalized understanding;Returned demonstration          PT Short Term Goals - 08/30/14 1301    PT SHORT TERM GOAL #1   Title Pt will perform HEP with mod I using paper handout to indicate safe daily compliance with home exercises. Target date: 09/27/14   Status New   PT SHORT TERM GOAL #2   Title Pt will ambulate x150' over level, indoor surface with mod I using SPC with correct sequencing of cane with gait. Target date: 09/27/14   Status New   PT SHORT TERM GOAL #3   Title Pt will increase DGI score from 15 to 18/24 to indicate improved gait stability in the presence of external demands. Target date: 09/27/14   Status New           PT Long Term Goals - 08/30/14 1307    PT LONG TERM GOAL #1   Title Pt will verbalize understanding of fall prevention strategies to decrease fall risk within home.  Target date: 10/25/14   Status New   PT LONG TERM GOAL #2   Title Pt will increase DGI score from 15 to 20/24 to indicate decreased fall risk. Target date: 10/25/14   Status New   PT LONG TERM GOAL #3   Title Pt will ambulate 500' over unlevel asphalt and grassy surfaces with LRAD and mod I with no overt LOB. Target date: 10/25/14   Status New   PT LONG TERM GOAL #4   Title Pt will negotiate standard curb step with LRAD and mod I to indicate ability to traverse community obstacles. Target date: 10/25/14   Status New   PT LONG TERM GOAL #5   Title Pt will increase Primary FOTO score from 42 to 52 to indicate improvement in patient-perceived functional status. Target date: 10/25/14   Status New               Plan - 09/08/14 1610    Clinical Impression Statement Established initial HEP and  provided paper handout for fall prevention strategies. Thomas Test revealed decreased extensibility of B rectus femoris muscles, R iliopsoas. Muscle tightness likely contributing to crouched gait pattern and frequent anterior LOB. Discussed fall prevention strategies in home. Continue per POC.   Pt will benefit from skilled therapeutic intervention in order to improve on the following deficits Decreased balance;Decreased strength;Impaired flexibility;Abnormal gait;Postural dysfunction;Decreased mobility;Decreased knowledge of use of DME;Decreased activity tolerance;Decreased coordination   Rehab Potential Good   Clinical Impairments Affecting Rehab Potential transportation limitations (daughter drives pt to appts and also assists with Hospice care for pt's wife)   PT Frequency 2x /  week   PT Duration 8 weeks   PT Treatment/Interventions Vestibular;DME Instruction;Gait training;Stair training;Functional mobility training;Balance training;Therapeutic exercise;Therapeutic activities;Neuromuscular re-education;Patient/family education;Manual techniques;ADLs/Self Care Home Management   PT Next Visit Plan Check HEP. Discuss fall prevention strategies. Consider SOT using Balance Master.    PT Home Exercise Plan HEP initiated 7/27. Consider [employer].   Consulted and Agree with Plan of Care Patient;Family member/caregiver   Family Member Consulted daughter, Olegario Shearer        Problem List Patient Active Problem List   Diagnosis Date Noted  . Pericardial effusion 09/30/2012  . RBBB and first degree atrioventricular block 09/30/2012  . HTN (hypertension) 09/30/2012    Billie Ruddy, PT, DPT [facility] [address] [place], [place], [address] Phone: [phone]   Fax:  [phone] 09/08/2014, 4:16 PM

## 2014-09-08 NOTE — Patient Instructions (Addendum)
Bridging   Slowly raise buttocks from floor, keeping stomach tight. Repeat __15__ times per set. Do __3__ sets per day.   HIP: Short Hip Flexors - Standing   Place hand on wall or countertop for support. Place one leg in front of other. Bend both knees. Lift chest and move hips toward countertop. Hold _60__ seconds, 2-3 times per day on each leg.  Hamstring Stretch, Seated (Strap, Two Chairs)   Sit with one leg extended onto facing chair. Loop strap over the top of your foot (as pictures). Hold for 60 seconds. Relax. Repeat on opposite side. Do this twice per aday on each leg.   Fall Prevention and Home Safety Falls cause injuries and can affect all age groups. It is possible to use preventive measures to significantly decrease the likelihood of falls. There are many simple measures which can make your home safer and prevent falls. OUTDOORS  Repair cracks and edges of walkways and driveways.  Remove high doorway thresholds.  Trim shrubbery on the main path into your home.  Have good outside lighting.  Clear walkways of tools, rocks, debris, and clutter.  Check that handrails are not broken and are securely fastened. Both sides of steps should have handrails.  Have leaves, snow, and ice cleared regularly.  Use sand or salt on walkways during winter months.  In the garage, clean up grease or oil spills. BATHROOM  Install night lights.  Install grab bars by the toilet and in the tub and shower.  Use non-skid mats or decals in the tub or shower.  Place a plastic non-slip stool in the shower to sit on, if needed.  Keep floors dry and clean up all water on the floor immediately.  Remove soap buildup in the tub or shower on a regular basis.  Secure bath mats with non-slip, double-sided rug tape.  Remove throw rugs and tripping hazards from the floors. BEDROOMS  Install night lights.  Make sure a bedside light is easy to reach.  Do not use oversized  bedding.  Keep a telephone by your bedside.  Have a firm chair with side arms to use for getting dressed.  Remove throw rugs and tripping hazards from the floor. KITCHEN  Keep handles on pots and pans turned toward the center of the stove. Use back burners when possible.  Clean up spills quickly and allow time for drying.  Avoid walking on wet floors.  Avoid hot utensils and knives.  Position shelves so they are not too high or low.  Place commonly used objects within easy reach.  If necessary, use a sturdy step stool with a grab bar when reaching.  Keep electrical cables out of the way.  Do not use floor polish or wax that makes floors slippery. If you must use wax, use non-skid floor wax.  Remove throw rugs and tripping hazards from the floor. STAIRWAYS  Never leave objects on stairs.  Place handrails on both sides of stairways and use them. Fix any loose handrails. Make sure handrails on both sides of the stairways are as long as the stairs.  Check carpeting to make sure it is firmly attached along stairs. Make repairs to worn or loose carpet promptly.  Avoid placing throw rugs at the top or bottom of stairways, or properly secure the rug with carpet tape to prevent slippage. Get rid of throw rugs, if possible.  Have an electrician put in a light switch at the top and bottom of the stairs. OTHER FALL PREVENTION  TIPS  Wear low-heel or rubber-soled shoes that are supportive and fit well. Wear closed toe shoes.  When using a stepladder, make sure it is fully opened and both spreaders are firmly locked. Do not climb a closed stepladder.  Add color or contrast paint or tape to grab bars and handrails in your home. Place contrasting color strips on first and last steps.  Learn and use mobility aids as needed. Install an electrical emergency response system.  Turn on lights to avoid dark areas. Replace light bulbs that burn out immediately. Get light switches that  glow.  Arrange furniture to create clear pathways. Keep furniture in the same place.  Firmly attach carpet with non-skid or double-sided tape.  Eliminate uneven floor surfaces.  Select a carpet pattern that does not visually hide the edge of steps.  Be aware of all pets. OTHER HOME SAFETY TIPS  Set the water temperature for 120 F (48.8 C).  Keep emergency numbers on or near the telephone.  Keep smoke detectors on every level of the home and near sleeping areas. Document Released: 01/19/2002 Document Revised: 07/31/2011 Document Reviewed: 04/20/2011 [facility] Patient Information 2015 [place], [place]. This information is not intended to replace advice given to you by your health care provider. Make sure you discuss any questions you have with your health care provider.

## 2014-09-13 ENCOUNTER — Ambulatory Visit: Payer: Medicare Other | Attending: Internal Medicine | Admitting: Physical Therapy

## 2014-09-13 DIAGNOSIS — R269 Unspecified abnormalities of gait and mobility: Secondary | ICD-10-CM

## 2014-09-13 DIAGNOSIS — M25659 Stiffness of unspecified hip, not elsewhere classified: Secondary | ICD-10-CM | POA: Insufficient documentation

## 2014-09-13 DIAGNOSIS — R2681 Unsteadiness on feet: Secondary | ICD-10-CM | POA: Diagnosis not present

## 2014-09-13 NOTE — Therapy (Signed)
[facility] [address] [place], [place], [address] Phone: [phone]   Fax:  [phone]  Physical Therapy Treatment  Patient Details  Name: Terry Gregory MRN: [id number] Date of Birth: [birth year] Referring Provider:  Burnard Bunting, MD  Encounter Date: 09/13/2014      PT End of Session - 09/13/14 1301    Visit Number 3   Number of Visits 17   Date for PT Re-Evaluation 10/29/14   Authorization Type Medicare - G Codes every 10 visits   PT Start Time 1103   PT Stop Time 1146   PT Time Calculation (min) 43 min   Activity Tolerance Patient tolerated treatment well   Behavior During Therapy [facility] for tasks assessed/performed      Past Medical History  Diagnosis Date  . Pericardial effusion   . Diverticulosis   . BPH (benign prostatic hyperplasia)     Past Surgical History  Procedure Laterality Date  . Appendectomy  1960  . Inguinal hernia repair  1980's  . Cholecystectomy  2000  . Total hip arthroplasty  2003    right  . Cataract extraction  2009    both eyes  . Wrist surgery  2009    left    There were no vitals filed for this visit.  Visit Diagnosis:  Abnormality of gait  Unsteadiness  Stiffness of hip joint, unspecified laterality      Subjective Assessment - 09/13/14 1106    Subjective Pt reports no falls, no changes. Pt did read over fall prevention strategies after last session. Has questions about the purpose of each home exercise.   Patient is accompained by: Family member  daughter, [name]   Pertinent History Goes by [name]". PMH siginificant for: HTN, OA,CKD, R THR (2003)   Limitations Walking;Standing   Patient Stated Goals "I want to be able to walk further; and to be steadier on my feet."   Currently in Pain? No/denies      Treatment   Therapeutic Exercises: - Pt performed home exercises with subtle to min cueing for technique, body mechanics. Due to significant pt difficulty  correctly performing standing hip flexor stretch, modified to [employer]. See Pt instructions for details, reps, and sets of all exercises. - Initiated [place]ago HEP. See Balance Exercises section below for details.                    [facility] Adult PT Treatment/Exercise - 09/13/14 0001    Transfers   Transfers Sit to Stand;Stand to Sit   Sit to Stand 6: Modified independent (Device/Increase time)   Stand to Sit 6: Modified independent (Device/Increase time)   Ambulation/Gait   Ambulation/Gait Yes   Ambulation/Gait Assistance 6: Modified independent (Device/Increase time)   Ambulation Distance (Feet) 250 Feet   Assistive device Straight cane;None   Gait Pattern Step-through pattern;Right flexed knee in stance;Left flexed knee in stance;Trunk flexed;Narrow base of support;Decreased trunk rotation  Limited B hip extension throughtout gait cycle   Ambulation Surface Level;Indoor   Stairs Yes   Stairs Assistance 5: Supervision   Curb --   Posture/Postural Control   Posture/Postural Control Postural limitations   Postural Limitations Decreased thoracic kyphosis;Posterior pelvic tilt   Posture Comments Limited B hip/knee extension during all standing, ambulation   Manual Therapy   Manual Therapy --   Manual therapy comments --             Balance Exercises - 09/13/14 [place]  Head Movements 5 reps   Neck Movements 5 reps   Back Extension 5 reps  x2; posterior LOB initially   Trunk Movements Standing   Ankle Movements 10 reps;Sitting   Hip ABductor 10 reps   Ankle Plantorflexors 20 reps, support  10 reps x2   Ankle Dorsiflexors 20 reps, support  10 reps x2           PT Education - 09/13/14 1259    Education provided Yes   Education Details HEP: modified standing hip flexor stretch to [employer]. Initiated [place].   Person(s) Educated Patient;Child(ren)   Methods Explanation;Demonstration;Tactile cues;Verbal cues;Handout   Comprehension  Verbalized understanding;Returned demonstration          PT Short Term Goals - 08/30/14 1301    PT SHORT TERM GOAL #1   Title Pt will perform HEP with mod I using paper handout to indicate safe daily compliance with home exercises. Target date: 09/27/14   Status New   PT SHORT TERM GOAL #2   Title Pt will ambulate x150' over level, indoor surface with mod I using SPC with correct sequencing of cane with gait. Target date: 09/27/14   Status New   PT SHORT TERM GOAL #3   Title Pt will increase DGI score from 15 to 18/24 to indicate improved gait stability in the presence of external demands. Target date: 09/27/14   Status New           PT Long Term Goals - 08/30/14 1307    PT LONG TERM GOAL #1   Title Pt will verbalize understanding of fall prevention strategies to decrease fall risk within home.  Target date: 10/25/14   Status New   PT LONG TERM GOAL #2   Title Pt will increase DGI score from 15 to 20/24 to indicate decreased fall risk. Target date: 10/25/14   Status New   PT LONG TERM GOAL #3   Title Pt will ambulate 500' over unlevel asphalt and grassy surfaces with LRAD and mod I with no overt LOB. Target date: 10/25/14   Status New   PT LONG TERM GOAL #4   Title Pt will negotiate standard curb step with LRAD and mod I to indicate ability to traverse community obstacles. Target date: 10/25/14   Status New   PT LONG TERM GOAL #5   Title Pt will increase Primary FOTO score from 42 to 52 to indicate improvement in patient-perceived functional status. Target date: 10/25/14   Status New               Plan - 09/13/14 1302    Clinical Impression Statement Modified hip flexor stretch to supine Thomas stretch to ensure proper performance. Initiated [place] exercise program. Noted significant limitation in mobility of spine, postural instability when attempting thoracolumbar extension on [place]. Continue per POC.   Pt will benefit from skilled therapeutic intervention in order to  improve on the following deficits Decreased balance;Decreased strength;Impaired flexibility;Abnormal gait;Postural dysfunction;Decreased mobility;Decreased knowledge of use of DME;Decreased activity tolerance;Decreased coordination   Rehab Potential Good   Clinical Impairments Affecting Rehab Potential transportation limitations (daughter drives pt to appts and also assists with Hospice care for pt's wife)   PT Frequency 2x / week   PT Duration 8 weeks   PT Treatment/Interventions Vestibular;DME Instruction;Gait training;Stair training;Functional mobility training;Balance training;Therapeutic exercise;Therapeutic activities;Neuromuscular re-education;Patient/family education;Manual techniques;ADLs/Self Care Home Management   PT Next Visit Plan Completed [place]. Add sit <> stand without UE's to HEP.  Consider SOT using Balance Master.  PT Home Exercise Plan See Pt Instrucitons on 8/1 for hip stretching/strengthening HEP.  Initiated [place] on 8/1.   Consulted and Agree with Plan of Care Patient;Family member/caregiver   Family Member Consulted daughter, Olegario Shearer        Problem List Patient Active Problem List   Diagnosis Date Noted  . Pericardial effusion 09/30/2012  . RBBB and first degree atrioventricular block 09/30/2012  . HTN (hypertension) 09/30/2012    Billie Ruddy, PT, DPT [facility] [address] [place], [place], [address] Phone: [phone]   Fax:  [phone] 09/13/2014, 1:05 PM

## 2014-09-13 NOTE — Patient Instructions (Signed)
Bridging   Slowly raise buttocks from floor, keeping stomach tight. Repeat __15__ times per set. Do __3__ sets per day.  Pelvic Rotation: Knee-to-Chest (Supine)   Lie on your back with one side close to the edge of the bed. Bring both knees to your chest; then, drop one leg off the edge of the bed. Keep the other knee to your chest. You should feel a gentle stretch in the front of the hip/thigh of the leg that is hanging over the edge of the bed. Hold for 60 seconds. Do this 2-3 per day on each leg.     Hamstring Stretch, Seated (Strap, Two Chairs)   Sit with one leg extended onto facing chair. Loop strap over the top of your foot (as pictures). Hold for 60 seconds. Relax. Repeat on opposite side. Do this twice per aday on each leg.

## 2014-09-17 ENCOUNTER — Encounter: Payer: Self-pay | Admitting: Physical Therapy

## 2014-09-17 ENCOUNTER — Ambulatory Visit: Payer: Medicare Other | Admitting: Physical Therapy

## 2014-09-17 DIAGNOSIS — M25659 Stiffness of unspecified hip, not elsewhere classified: Secondary | ICD-10-CM | POA: Diagnosis not present

## 2014-09-17 DIAGNOSIS — R2681 Unsteadiness on feet: Secondary | ICD-10-CM | POA: Diagnosis not present

## 2014-09-17 DIAGNOSIS — R269 Unspecified abnormalities of gait and mobility: Secondary | ICD-10-CM

## 2014-09-18 NOTE — Therapy (Signed)
[facility] [address] [place], [place], [address] Phone: [phone]   Fax:  [phone]  Physical Therapy Treatment  Patient Details  Name: Terry Gregory MRN: [id number] Date of Birth: 1921-03-19 Referring Provider:  Burnard Bunting, MD  Encounter Date: 09/17/2014    09/17/14 1107  PT Visits / Re-Eval  Visit Number 4  Number of Visits 17  Date for PT Re-Evaluation 10/29/14  Authorization  Authorization Type Medicare - G Codes every 10 visits  PT Time Calculation  PT Start Time 1103  PT Stop Time 1150  PT Time Calculation (min) 47 min  PT - End of Session  Equipment Utilized During Treatment Gait belt  Activity Tolerance Patient tolerated treatment well  Behavior During Therapy [facility] for tasks assessed/performed     Past Medical History  Diagnosis Date  . Pericardial effusion   . Diverticulosis   . BPH (benign prostatic hyperplasia)     Past Surgical History  Procedure Laterality Date  . Appendectomy  1960  . Inguinal hernia repair  1980's  . Cholecystectomy  2000  . Total hip arthroplasty  2003    right  . Cataract extraction  2009    both eyes  . Wrist surgery  2009    left    There were no vitals filed for this visit.  Visit Diagnosis:  Abnormality of gait  Unsteadiness  Stiffness of hip joint, unspecified laterality     09/17/14 1106  Symptoms/Limitations  Subjective No new complaints. No falls or pain to report.   Pain Assessment  Currently in Pain? No/denies      09/17/14 1108  OTAGO PROGRAM  Head Movements Sitting;5 reps  Neck Movements Sitting;5 reps  Back Extension Standing;5 reps  Trunk Movements Standing;5 reps  Ankle Movements Sitting;Other reps (comment)  Hip ABductor 10 reps  Ankle Plantorflexors Level C  Ankle Dorsiflexors Level C  Knee Bends Level A  Backwards Walking Level B  Sideways Walking Level C  Tandem Stance Level A  Tandem Walk Level C  One Leg Stand  Level B  Sit to Stand Level B (ii)   Neuro re-ed: sensory organization test performed with following results: Conditions: 1:  All 3 above normal 2:  All 3 above normal 3:  Below norm, next 2 above norm 4: fall, below norm, above norm 5: all 3 falls 6:  All 3 falls Composite score: 40  Sensory Analysis Som: above norm Vis: below norm Vest: barley on chart (<5%) Pref: above norm Strategy analysis: ankle dominant      COG alignment: left posterior preference               PT Short Term Goals - 08/30/14 1301    PT SHORT TERM GOAL #1   Title Pt will perform HEP with mod I using paper handout to indicate safe daily compliance with home exercises. Target date: 09/27/14   Status New   PT SHORT TERM GOAL #2   Title Pt will ambulate x150' over level, indoor surface with mod I using SPC with correct sequencing of cane with gait. Target date: 09/27/14   Status New   PT SHORT TERM GOAL #3   Title Pt will increase DGI score from 15 to 18/24 to indicate improved gait stability in the presence of external demands. Target date: 09/27/14   Status New           PT Long Term Goals - 08/30/14 1307    PT LONG TERM GOAL #  1   Title Pt will verbalize understanding of fall prevention strategies to decrease fall risk within home.  Target date: 10/25/14   Status New   PT LONG TERM GOAL #2   Title Pt will increase DGI score from 15 to 20/24 to indicate decreased fall risk. Target date: 10/25/14   Status New   PT LONG TERM GOAL #3   Title Pt will ambulate 500' over unlevel asphalt and grassy surfaces with LRAD and mod I with no overt LOB. Target date: 10/25/14   Status New   PT LONG TERM GOAL #4   Title Pt will negotiate standard curb step with LRAD and mod I to indicate ability to traverse community obstacles. Target date: 10/25/14   Status New   PT LONG TERM GOAL #5   Title Pt will increase Primary FOTO score from 42 to 52 to indicate improvement in patient-perceived functional status. Target  date: 10/25/14   Status New        09/17/14 1107  Plan  Clinical Impression Statement Completed OTAGO program today for HEP. Also completed the Sensory Organization Test, which showed pt to have a significant vestibular hypofuntion. No time to go over results in detail with pt today due to end of session, will do so next session. Pt is making steady progress toward goals.                   Pt will benefit from skilled therapeutic intervention in order to improve on the following deficits Decreased balance;Decreased strength;Impaired flexibility;Abnormal gait;Postural dysfunction;Decreased mobility;Decreased knowledge of use of DME;Decreased activity tolerance;Decreased coordination  Rehab Potential Good  Clinical Impairments Affecting Rehab Potential transportation limitations (daughter drives pt to appts and also assists with Hospice care for pt's wife)  PT Frequency 2x / week  PT Duration 8 weeks  PT Treatment/Interventions Vestibular;DME Instruction;Gait training;Stair training;Functional mobility training;Balance training;Therapeutic exercise;Therapeutic activities;Neuromuscular re-education;Patient/family education;Manual techniques;ADLs/Self Care Home Management  PT Next Visit Plan Vestibular exercises/activities, dynamic balance activities  PT Home Exercise Plan OTAGO  Consulted and Agree with Plan of Care Patient;Family member/caregiver  Family Member Consulted daughter, Olegario Shearer    Problem List Patient Active Problem List   Diagnosis Date Noted  . Pericardial effusion 09/30/2012  . RBBB and first degree atrioventricular block 09/30/2012  . HTN (hypertension) 09/30/2012    Willow Ora 09/18/2014, 9:51 PM  Willow Ora, PTA, [facility] [address] [place], [place] [address] [phone] 09/18/2014, 9:51 PM

## 2014-09-21 ENCOUNTER — Encounter: Payer: Self-pay | Admitting: Physical Therapy

## 2014-09-21 ENCOUNTER — Ambulatory Visit: Payer: Medicare Other | Admitting: Physical Therapy

## 2014-09-21 DIAGNOSIS — R2681 Unsteadiness on feet: Secondary | ICD-10-CM | POA: Diagnosis not present

## 2014-09-21 DIAGNOSIS — R269 Unspecified abnormalities of gait and mobility: Secondary | ICD-10-CM | POA: Diagnosis not present

## 2014-09-21 DIAGNOSIS — M25659 Stiffness of unspecified hip, not elsewhere classified: Secondary | ICD-10-CM

## 2014-09-21 NOTE — Therapy (Signed)
[facility] [address] [place], [place], [address] Phone: [phone]   Fax:  [phone]  Physical Therapy Treatment  Patient Details  Name: Terry Gregory MRN: [id number] Date of Birth: 29-Nov-1921 Referring Provider:  Burnard Bunting, MD  Encounter Date: 09/21/2014      PT End of Session - 09/21/14 1022    Visit Number 5   Number of Visits 17   Date for PT Re-Evaluation 10/29/14   Authorization Type Medicare - G Codes every 10 visits   PT Start Time 1019   PT Stop Time 1100   PT Time Calculation (min) 41 min   Equipment Utilized During Treatment Gait belt   Activity Tolerance Patient tolerated treatment well   Behavior During Therapy [facility] for tasks assessed/performed      Past Medical History  Diagnosis Date  . Pericardial effusion   . Diverticulosis   . BPH (benign prostatic hyperplasia)     Past Surgical History  Procedure Laterality Date  . Appendectomy  1960  . Inguinal hernia repair  1980's  . Cholecystectomy  2000  . Total hip arthroplasty  2003    right  . Cataract extraction  2009    both eyes  . Wrist surgery  2009    left    There were no vitals filed for this visit.  Visit Diagnosis:  Abnormality of gait  Unsteadiness  Stiffness of hip joint, unspecified laterality      Subjective Assessment - 09/22/14 1032    Subjective No new complaints. No falls or pain to report.  HEP "is going best as I can".     Treatment: In corner On floor feet together Eyes closed no head movements, eyes closed head nods/shakes/diagonals both ways. All with min assist for balance  On air ex feet apart  Eyes closed no head movements, eyes closed head nods/shakes/diagonals both ways. All with min assist for balance  in parallel bars On balance board both ways: eyes open hold steady, then rocking. Eyes closed hold steady with no head movements, then with head nods/shakes.  At mat:  Standing with  feet across red foam beam alternating fwd heel taps and bwd toe taps x 10 each with min assist for balance.  Cues needed with all the above for posture and for weight shifting for balance.         PT Short Term Goals - 08/30/14 1301    PT SHORT TERM GOAL #1   Title Pt will perform HEP with mod I using paper handout to indicate safe daily compliance with home exercises. Target date: 09/27/14   Status New   PT SHORT TERM GOAL #2   Title Pt will ambulate x150' over level, indoor surface with mod I using SPC with correct sequencing of cane with gait. Target date: 09/27/14   Status New   PT SHORT TERM GOAL #3   Title Pt will increase DGI score from 15 to 18/24 to indicate improved gait stability in the presence of external demands. Target date: 09/27/14   Status New           PT Long Term Goals - 08/30/14 1307    PT LONG TERM GOAL #1   Title Pt will verbalize understanding of fall prevention strategies to decrease fall risk within home.  Target date: 10/25/14   Status New   PT LONG TERM GOAL #2   Title Pt will increase DGI score from 15 to 20/24 to indicate decreased fall risk.  Target date: 10/25/14   Status New   PT LONG TERM GOAL #3   Title Pt will ambulate 500' over unlevel asphalt and grassy surfaces with LRAD and mod I with no overt LOB. Target date: 10/25/14   Status New   PT LONG TERM GOAL #4   Title Pt will negotiate standard curb step with LRAD and mod I to indicate ability to traverse community obstacles. Target date: 10/25/14   Status New   PT LONG TERM GOAL #5   Title Pt will increase Primary FOTO score from 42 to 52 to indicate improvement in patient-perceived functional status. Target date: 10/25/14   Status New           Plan - 09/21/14 1022    Clinical Impression Statement Pt making steady progress toward goals. Iniitated activites for balance to stimulate vestibular system.    Pt will benefit from skilled therapeutic intervention in order to improve on the  following deficits Decreased balance;Decreased strength;Impaired flexibility;Abnormal gait;Postural dysfunction;Decreased mobility;Decreased knowledge of use of DME;Decreased activity tolerance;Decreased coordination   Rehab Potential Good   Clinical Impairments Affecting Rehab Potential transportation limitations (daughter drives pt to appts and also assists with Hospice care for pt's wife)   PT Frequency 2x / week   PT Duration 8 weeks   PT Treatment/Interventions Vestibular;DME Instruction;Gait training;Stair training;Functional mobility training;Balance training;Therapeutic exercise;Therapeutic activities;Neuromuscular re-education;Patient/family education;Manual techniques;ADLs/Self Care Home Management   PT Next Visit Plan Vestibular exercises/activities, dynamic balance activities   PT Home Exercise Plan OTAGO   Consulted and Agree with Plan of Care Patient;Family member/caregiver   Family Member Consulted daughter, Olegario Shearer        Problem List Patient Active Problem List   Diagnosis Date Noted  . Pericardial effusion 09/30/2012  . RBBB and first degree atrioventricular block 09/30/2012  . HTN (hypertension) 09/30/2012    Willow Ora 09/22/2014, 10:34 AM  Willow Ora, PTA, [facility] [address] [place], [place] [address] [phone] 09/22/2014, 10:34 AM

## 2014-09-22 ENCOUNTER — Ambulatory Visit: Payer: Medicare Other | Admitting: Physical Therapy

## 2014-09-22 DIAGNOSIS — M25659 Stiffness of unspecified hip, not elsewhere classified: Secondary | ICD-10-CM | POA: Diagnosis not present

## 2014-09-22 DIAGNOSIS — R269 Unspecified abnormalities of gait and mobility: Secondary | ICD-10-CM | POA: Diagnosis not present

## 2014-09-22 DIAGNOSIS — R2681 Unsteadiness on feet: Secondary | ICD-10-CM

## 2014-09-22 NOTE — Therapy (Signed)
[facility] [address] [place], [place], [address] Phone: [phone]   Fax:  [phone]  Physical Therapy Treatment  Patient Details  Name: Terry Gregory MRN: [id number] Date of Birth: Oct 10, 1921 Referring Provider:  Burnard Bunting, MD  Encounter Date: 09/22/2014      PT End of Session - 09/22/14 1122    Visit Number 6   Number of Visits 17   Date for PT Re-Evaluation 10/29/14   Authorization Type Medicare - G Codes every 10 visits   PT Start Time 1017   PT Stop Time 1102   PT Time Calculation (min) 45 min   Equipment Utilized During Treatment Gait belt   Activity Tolerance Patient tolerated treatment well   Behavior During Therapy [facility] for tasks assessed/performed      Past Medical History  Diagnosis Date  . Pericardial effusion   . Diverticulosis   . BPH (benign prostatic hyperplasia)     Past Surgical History  Procedure Laterality Date  . Appendectomy  1960  . Inguinal hernia repair  1980's  . Cholecystectomy  2000  . Total hip arthroplasty  2003    right  . Cataract extraction  2009    both eyes  . Wrist surgery  2009    left    There were no vitals filed for this visit.  Visit Diagnosis:  Abnormality of gait  Unsteadiness      Subjective Assessment - 09/22/14 1032    Subjective No new complaints. No falls or pain to report.  HEP "is going best as I can".                         [facility] Adult PT Treatment/Exercise - 09/22/14 0001    Transfers   Transfers Sit to Stand   Sit to Stand 5: Supervision;4: Min guard;4: Min assist   Stand to Sit 5: Supervision;4: Min guard   Stand to [employer] practice of sit <> stand without UE support; initially from mat table with wedge under buttocks to decrease posterior pelvic tilt; manual facilitation initially required for full anterior weight shift. Pt able to perform final sit > stand from neutral height mat table withjout  UE support, min cueing for setup.   Ambulation/Gait   Ambulation/Gait Yes   Ambulation/Gait Assistance 6: Modified independent (Device/Increase time)   Ambulation Distance (Feet) 200 Feet   Assistive device Straight cane   Gait Pattern Step-through pattern;Right flexed knee in stance;Left flexed knee in stance;Trunk flexed;Decreased trunk rotation  Limited B hip extension throughtout gait cycle   Ambulation Surface Level;Indoor   Stairs Yes   Stairs Assistance 5: Supervision;4: Min guard   Stairs Assistance Details (indicate cue type and reason) supervision with use of 1 rail; min guard when pt attempted stair negotiation without rail   Stair Management Technique One rail Left;Alternating pattern;Forwards;No rails   Number of Stairs 12   Height of Stairs 6             Balance Exercises - 09/22/14 1118    Balance Exercises: Standing   Standing Eyes Opened Head turns;Foam/compliant surface;10 secs  horziontal x10' ; vertical x10 reps with (S)   Standing Eyes Closed Head turns;Foam/compliant surface;10 secs  horziontal x10' ; vertical x10 reps with Min guard   Wall Bumps Hip   Wall Bumps-Hips Eyes opened;Anterior/posterior;20 reps  Cueing for remaining in each position x5 seconds  PT Education - 09/22/14 1105    Education provided Yes   Education Details Reviewed [place], per request of pt/daughter.   Person(s) Educated Patient;Child(ren)   Methods Explanation;Demonstration;Tactile cues;Verbal cues;Handout   Comprehension Verbalized understanding;Returned demonstration          PT Short Term Goals - 09/22/14 1126    PT SHORT TERM GOAL #1   Title Pt will perform HEP with mod I using paper handout to indicate safe daily compliance with home exercises. Target date: 09/27/14   Status On-going   PT SHORT TERM GOAL #2   Title Pt will ambulate x150' over level, indoor surface with mod I using SPC with correct sequencing of cane with gait. Target date: 09/27/14    Status On-going   PT SHORT TERM GOAL #3   Title Pt will increase DGI score from 15 to 18/24 to indicate improved gait stability in the presence of external demands. Target date: 09/27/14   Status On-going           PT Long Term Goals - 09/22/14 1127    PT LONG TERM GOAL #1   Title Pt will verbalize understanding of fall prevention strategies to decrease fall risk within home.  Target date: 10/25/14   Status On-going   PT LONG TERM GOAL #2   Title Pt will increase DGI score from 15 to 20/24 to indicate decreased fall risk. Target date: 10/25/14   Status On-going   PT LONG TERM GOAL #3   Title Pt will ambulate 500' over unlevel asphalt and grassy surfaces with LRAD and mod I with no overt LOB. Target date: 10/25/14   Status On-going   PT LONG TERM GOAL #4   Title Pt will negotiate standard curb step with LRAD and mod I to indicate ability to traverse community obstacles. Target date: 10/25/14   Status On-going   PT LONG TERM GOAL #5   Title Pt will increase Primary FOTO score from 42 to 52 to indicate improvement in patient-perceived functional status. Target date: 10/25/14   Status On-going               Plan - 09/22/14 1122    Clinical Impression Statement Pt with impaired hip strategy during wall bump exercise. Also noted posterior LOB when standing on pillow with EC performing head turns (LOB more frequent/prominent with vertical head turns). Continue per POC.   Pt will benefit from skilled therapeutic intervention in order to improve on the following deficits Decreased balance;Decreased strength;Impaired flexibility;Abnormal gait;Postural dysfunction;Decreased mobility;Decreased knowledge of use of DME;Decreased activity tolerance;Decreased coordination   Rehab Potential Good   Clinical Impairments Affecting Rehab Potential transportation limitations (daughter drives pt to appts and also assists with Hospice care for pt's wife)   PT Frequency 2x / week   PT Duration 8 weeks    PT Treatment/Interventions Vestibular;DME Instruction;Gait training;Stair training;Functional mobility training;Balance training;Therapeutic exercise;Therapeutic activities;Neuromuscular re-education;Patient/family education;Manual techniques;ADLs/Self Care Home Management   PT Next Visit Plan Check STG's. Work on hip strategy (wall bumps while standing on pillow); head turns on compliant surface   PT Home Exercise Plan OTAGO (have completed instruction). Consider adding corner balance exercise for EC on compliant surface with head turns (if safe).   Consulted and Agree with Plan of Care Patient;Family member/caregiver   Family Member Consulted daughter, Olegario Shearer        Problem List Patient Active Problem List   Diagnosis Date Noted  . Pericardial effusion 09/30/2012  . RBBB and first degree atrioventricular block 09/30/2012  .  HTN (hypertension) 09/30/2012    Billie Ruddy, PT, DPT [facility] [address] [place], [place], [address] Phone: [phone]   Fax:  [phone] 09/22/2014, 11:28 AM

## 2014-09-28 ENCOUNTER — Ambulatory Visit: Payer: Medicare Other | Admitting: Physical Therapy

## 2014-09-28 ENCOUNTER — Encounter: Payer: Self-pay | Admitting: Physical Therapy

## 2014-09-28 DIAGNOSIS — R269 Unspecified abnormalities of gait and mobility: Secondary | ICD-10-CM

## 2014-09-28 DIAGNOSIS — M25659 Stiffness of unspecified hip, not elsewhere classified: Secondary | ICD-10-CM | POA: Diagnosis not present

## 2014-09-28 DIAGNOSIS — R2681 Unsteadiness on feet: Secondary | ICD-10-CM

## 2014-09-28 NOTE — Therapy (Signed)
[facility] [address] [place], [place], [address] Phone: [phone]   Fax:  [phone]  Physical Therapy Treatment  Patient Details  Name: Terry Gregory MRN: [id number] Date of Birth: May 06, 1921 Referring Provider:  Burnard Bunting, MD  Encounter Date: 09/28/2014    09/28/14 1236  PT Visits / Re-Eval  Visit Number 7  Number of Visits 17  Date for PT Re-Evaluation 10/29/14  Authorization  Authorization Type Medicare - G Codes every 10 visits  PT Time Calculation  PT Start Time 1232  PT Stop Time 1311  PT Time Calculation (min) 39 min  PT - End of Session  Equipment Utilized During Treatment Gait belt  Activity Tolerance Patient tolerated treatment well  Behavior During Therapy [facility] for tasks assessed/performed    Past Medical History  Diagnosis Date  . Pericardial effusion   . Diverticulosis   . BPH (benign prostatic hyperplasia)     Past Surgical History  Procedure Laterality Date  . Appendectomy  1960  . Inguinal hernia repair  1980's  . Cholecystectomy  2000  . Total hip arthroplasty  2003    right  . Cataract extraction  2009    both eyes  . Wrist surgery  2009    left    There were no vitals filed for this visit.  Visit Diagnosis:  I Abnormality of gait   .  Unsteadiness    Stiffness of hip joint, unspecified laterality         Subjective Assessment - 09/28/14 1235    Subjective No new complaints. No falls or pain to report.  HEP  "takes all day".   Patient is accompained by: Family member  daughter, Casilda Carls   Pertinent History Goes by [name]". PMH siginificant for: HTN, OA,CKD, R THR (2003)   Currently in Pain? No/denies           [facility] Adult PT Treatment/Exercise - 09/28/14 1239    Ambulation/Gait   Ambulation/Gait Yes   Ambulation/Gait Assistance 6: Modified independent (Device/Increase time);5: Supervision   Ambulation/Gait Assistance Details supervision with no AD.   Ambulation Distance (Feet) 220 Feet  x2 (one each with and without cane)   Assistive device Straight cane;None   Gait Pattern Step-through pattern;Right flexed knee in stance;Left flexed knee in stance;Trunk flexed;Decreased trunk rotation   Ambulation Surface Level;Indoor   Dynamic Gait Index   Level Surface Normal   Change in Gait Speed Normal   Gait with Horizontal Head Turns Mild Impairment   Gait with Vertical Head Turns Mild Impairment   Gait and Pivot Turn Normal   Step Over Obstacle Mild Impairment   Step Around Obstacles Normal   Steps Mild Impairment   Total Score 20     Neuro Re-ed: In corner Wall bumps with chair in front of hip. X 20 reps with verbal and visual cues on correct technique/ex form.  Air ex with chair in front of him Feet apart with eyes closed - no head movement, head nods, head shakes,head diagonals both ways x 10 each with min guard assist to min assist for balance.  Feet together with eyes open -no head movements, head nods, head shakes and head diagonals both ways x 10 each with up to min assist for balance.        PT Short Term Goals - 09/28/14 1238    PT SHORT TERM GOAL #1   Title Pt will perform HEP with mod I using paper handout to indicate safe daily compliance  with home exercises. Target date: 09/27/14   Status Achieved   PT SHORT TERM GOAL #2   Title Pt will ambulate x150' over level, indoor surface with mod I using SPC with correct sequencing of cane with gait. Target date: 09/27/14   Status Achieved   PT SHORT TERM GOAL #3   Title Pt will increase DGI score from 15 to 18/24 to indicate improved gait stability in the presence of external demands. Target date: 09/27/14   Baseline 20/24 on 09/28/14   Status Achieved           PT Long Term Goals - 09/22/14 1127    PT LONG TERM GOAL #1   Title Pt will verbalize understanding of fall prevention strategies to decrease fall risk within home.  Target date: 10/25/14   Status On-going   PT  LONG TERM GOAL #2   Title Pt will increase DGI score from 15 to 20/24 to indicate decreased fall risk. Target date: 10/25/14   Status On-going   PT LONG TERM GOAL #3   Title Pt will ambulate 500' over unlevel asphalt and grassy surfaces with LRAD and mod I with no overt LOB. Target date: 10/25/14   Status On-going   PT LONG TERM GOAL #4   Title Pt will negotiate standard curb step with LRAD and mod I to indicate ability to traverse community obstacles. Target date: 10/25/14   Status On-going   PT LONG TERM GOAL #5   Title Pt will increase Primary FOTO score from 42 to 52 to indicate improvement in patient-perceived functional status. Target date: 10/25/14   Status On-going        09/28/14 1236  Plan  Clinical Impression Statement All STG's met. Pt still with decreased balance with corner exercises, especially on compliant surfaces. Multiple posterior loss of balance episodes. Pt making steady progress toward goals.  Pt will benefit from skilled therapeutic intervention in order to improve on the following deficits Decreased balance;Decreased strength;Impaired flexibility;Abnormal gait;Postural dysfunction;Decreased mobility;Decreased knowledge of use of DME;Decreased activity tolerance;Decreased coordination  Rehab Potential Good  Clinical Impairments Affecting Rehab Potential transportation limitations (daughter drives pt to appts and also assists with Hospice care for pt's wife)  PT Frequency 2x / week  PT Duration 8 weeks  PT Treatment/Interventions Vestibular;DME Instruction;Gait training;Stair training;Functional mobility training;Balance training;Therapeutic exercise;Therapeutic activities;Neuromuscular re-education;Patient/family education;Manual techniques;ADLs/Self Care Home Management  PT Next Visit Plan Work on hip strategy (wall bumps while standing on pillow); head turns on compliant surface  PT Home Exercise Plan OTAGO (have completed instruction). Consider adding corner balance  exercise for EC on compliant surface with head turns (if safe).  Consulted and Agree with Plan of Care Patient;Family member/caregiver  Family Member Consulted daughter, Olegario Shearer    Problem List Patient Active Problem List   Diagnosis Date Noted  . Pericardial effusion 09/30/2012  . RBBB and first degree atrioventricular block 09/30/2012  . HTN (hypertension) 09/30/2012    Willow Ora 09/28/2014, 1:07 PM  Willow Ora, PTA, [facility] [address] [place], [place] [address] [phone] 09/28/2014, 1:07 PM

## 2014-10-01 ENCOUNTER — Ambulatory Visit (INDEPENDENT_AMBULATORY_CARE_PROVIDER_SITE_OTHER): Payer: Medicare Other | Admitting: Cardiovascular Disease

## 2014-10-01 ENCOUNTER — Ambulatory Visit: Payer: Medicare Other | Admitting: Physical Therapy

## 2014-10-01 ENCOUNTER — Encounter: Payer: Self-pay | Admitting: Cardiovascular Disease

## 2014-10-01 VITALS — BP 156/72 | HR 69 | Resp 16 | Ht 75.0 in | Wt 212.0 lb

## 2014-10-01 DIAGNOSIS — I10 Essential (primary) hypertension: Secondary | ICD-10-CM

## 2014-10-01 DIAGNOSIS — I313 Pericardial effusion (noninflammatory): Secondary | ICD-10-CM

## 2014-10-01 DIAGNOSIS — I319 Disease of pericardium, unspecified: Secondary | ICD-10-CM | POA: Diagnosis not present

## 2014-10-01 DIAGNOSIS — R269 Unspecified abnormalities of gait and mobility: Secondary | ICD-10-CM

## 2014-10-01 DIAGNOSIS — R2681 Unsteadiness on feet: Secondary | ICD-10-CM | POA: Diagnosis not present

## 2014-10-01 DIAGNOSIS — M25659 Stiffness of unspecified hip, not elsewhere classified: Secondary | ICD-10-CM | POA: Diagnosis not present

## 2014-10-01 DIAGNOSIS — I3139 Other pericardial effusion (noninflammatory): Secondary | ICD-10-CM

## 2014-10-01 DIAGNOSIS — I4510 Unspecified right bundle-branch block: Secondary | ICD-10-CM | POA: Diagnosis not present

## 2014-10-01 MED ORDER — LISINOPRIL 10 MG PO TABS: 10.0000 mg | ORAL_TABLET | Freq: Every day | ORAL | Status: DC

## 2014-10-01 NOTE — Patient Instructions (Addendum)
Feet Apart (Compliant Surface) Arms by Your Side - Eyes Closed   Stand on a pillow with your back to a corner. Place a a stable chair in front of you and feet shoulder width apart and arms by your side. Close eyes and visualize upright position. Hold_30___ seconds. Repeat __4__ times per day.   Gaze Stabilization: Standing Feet Apart   Feet shoulder width apart, keeping eyes on target on wall _3__ feet away, tilt head down 15-30 and move head side to side for _60__ seconds. Repeat while moving head up and down for __60__ seconds. Do __2__ sessions per day.  Gaze Stabilization: Tip Card 1.Target must remain in focus, not blurry, and appear stationary while head is in motion. 2.Perform exercises with small head movements (45 to either side of midline). 3.Increase speed of head motion so long as target is in focus. 4.Leave your progressive lenses on for the exercise. 5.These exercises may provoke dizziness or nausea. Work through these symptoms. If too dizzy, slow head movement slightly. Rest between each exercise. 6.Exercises demand concentration; avoid distractions. 7.For safety, perform standing exercises close to a counter, wall, corner, or next to someone.

## 2014-10-01 NOTE — Progress Notes (Signed)
Patient ID: Terry Gregory, male   DOB: [birth year], 81 y.o.   MRN: [id number]     Cardiology Office Note   Date:  10/01/2014   ID:  Terry Gregory, DOB February 26, 1921, MRN [id number]  PCP:  Geoffery Lyons, MD  Cardiologist:   Sanda Klein, MD   Chief Complaint  Patient presents with  . Annual Exam    Patient has no complaints.      History of Present Illness: Terry Gregory is a 81 y.o. male who presents for  Follow-up for idiopathic pericardial effusion , systemic hypertension and AV conduction disease.  He has not had any new health challenges since his last appointment. I repeat echocardiogram performed in March showed virtual complete resolution of his pericardial effusion. This happened despite no active therapy. Previous treatment with posterolateral anti-inflammatory drugs and colchicine did not help. A cause for his effusion was never identified.   He denies syncope, dizziness, lightheadedness or excessive fatigue or dyspnea. His electrocardiogram shows an even longer first-degree AV block at about 440 ms as well as right bundle branch block. Blood pressure has been well controlled when checked at home, although it is slightly high today.    Past Medical History  Diagnosis Date  . Pericardial effusion   . Diverticulosis   . BPH (benign prostatic hyperplasia)     Past Surgical History  Procedure Laterality Date  . Appendectomy  1960  . Inguinal hernia repair  1980's  . Cholecystectomy  2000  . Total hip arthroplasty  2003    right  . Cataract extraction  2009    both eyes  . Wrist surgery  2009    left     Current Outpatient Prescriptions  Medication Sig Dispense Refill  . acetic acid-aluminum acetate (DOMEBORO OTIC) 2 % otic solution Place 5 drops into both ears.    . finasteride (PROSCAR) 5 MG tablet Take 5 mg by mouth daily.    . metoprolol succinate (TOPROL-XL) 50 MG 24 hr tablet Take 50 mg by mouth daily. Take with or immediately following a meal.    .  polyethylene glycol (MIRALAX / GLYCOLAX) packet Take 17 g by mouth daily.    Marland Kitchen lisinopril (PRINIVIL,ZESTRIL) 10 MG tablet Take 1 tablet (10 mg total) by mouth daily. 30 tablet 6   No current facility-administered medications for this visit.    Allergies:   Erythromycin and Penicillins    Social History:  The patient  reports that he has never smoked. He has never used smokeless tobacco. He reports that he does not drink alcohol or use illicit drugs.   Family History:  The patient's family history includes Cancer in his sister; Diabetes in his sister; Heart attack in his father; Heart failure in his brother; Stroke in his brother and mother.    ROS:  Please see the history of present illness.    Otherwise, review of systems positive for none.   All other systems are reviewed and negative.    PHYSICAL EXAM: VS:  BP 156/72 mmHg  Pulse 69  Resp 16  Ht 6\' 3"  (1.905 m)  Wt 212 lb (96.163 kg)  BMI 26.50 kg/m2 , BMI Body mass index is 26.5 kg/(m^2).  General: Alert, oriented x3, no distress Head: no evidence of trauma, PERRL, EOMI, no exophtalmos or lid lag, no myxedema, no xanthelasma; normal ears, nose and oropharynx Neck: normal jugular venous pulsations and no hepatojugular reflux; brisk carotid pulses without delay and no carotid bruits Chest: clear to auscultation, no signs of  consolidation by percussion or palpation, normal fremitus, symmetrical and full respiratory excursions Cardiovascular: normal position and quality of the apical impulse, regular rhythm, normal first and  Wide split second heart sounds, no murmurs, rubs or gallops Abdomen: no tenderness or distention, no masses by palpation, no abnormal pulsatility or arterial bruits, normal bowel sounds, no hepatosplenomegaly Extremities: no clubbing, cyanosis or edema; 2+ radial, ulnar and brachial pulses bilaterally; 2+ right femoral, posterior tibial and dorsalis pedis pulses; 2+ left femoral, posterior tibial and dorsalis  pedis pulses; no subclavian or femoral bruits Neurological: grossly nonfocal Psych: euthymic mood, full affect   EKG:  EKG is ordered today. The ekg ordered today demonstrates  Minus rhythm with very long first degree AV block 440 ms, right bundle branch block. A single PAC is seen.   Recent Labs: No results found for requested labs within last 365 days.    Lipid Panel No results found for: CHOL, TRIG, HDL, CHOLHDL, VLDL, LDLCALC, LDLDIRECT    Wt Readings from Last 3 Encounters:  10/01/14 212 lb (96.163 kg)  09/25/12 215 lb 3.2 oz (97.614 kg)       ASSESSMENT AND PLAN:  1.  Idiopathic pericardial effusion, resolved 2. AV conduction disease. His first-degree AV block is substantially longer than it was even a year ago. I think we need to gradually discontinue his beta blocker and replace it with an alternative anti-hypertensive medication. He will wean off metoprolol over the next couple weeks and start treatment with lisinopril. Check blood pressure at home and send [place] recordings in 2-3 weeks.    Current medicines are reviewed at length with the patient today.  The patient does not have concerns regarding medicines.  The following changes have been made:   Wean metoprolol gradually, start lisinopril  Labs/ tests ordered today include:  Orders Placed This Encounter  Procedures  . EKG 12-Lead     Patient Instructions  Medication Instructions:   WEAN YOURSELF OFF THE METOPROLOL:  TAKE 1/2 TABLET DAILY X 5 DAYS, THEN 1/4 TABLET X 5 DAYS THEN STOP.  START LISINOPRIL 10MG  DAILY    Follow-Up:  ONE Terrilee Files, MD  10/01/2014 11:40 AM    Sanda Klein, MD, [facility] [phone] office [phone] pager

## 2014-10-01 NOTE — Patient Instructions (Signed)
Medication Instructions:   WEAN YOURSELF OFF THE METOPROLOL:  TAKE 1/2 TABLET DAILY X 5 DAYS, THEN 1/4 TABLET X 5 DAYS THEN STOP.  START LISINOPRIL 10MG  DAILY    Follow-Up:  ONE YEAR

## 2014-10-02 NOTE — Therapy (Signed)
[facility] [address] [place], [place], [address] Phone: [phone]   Fax:  [phone]  Physical Therapy Treatment  Patient Details  Name: Terry Gregory MRN: [id number] Date of Birth: 10/10/1921 Referring Provider:  Burnard Bunting, MD  Encounter Date: 10/01/2014      PT End of Session - 10/02/14 1100    Visit Number 8   Number of Visits 17   Date for PT Re-Evaluation 10/29/14   Authorization Type Medicare - G Codes every 10 visits   PT Start Time 1102   PT Stop Time 1148   PT Time Calculation (min) 46 min   Equipment Utilized During Treatment Gait belt   Activity Tolerance Patient tolerated treatment well   Behavior During Therapy [name] for tasks assessed/performed      Past Medical History  Diagnosis Date  . Pericardial effusion   . Diverticulosis   . BPH (benign prostatic hyperplasia)     Past Surgical History  Procedure Laterality Date  . Appendectomy  1960  . Inguinal hernia repair  1980's  . Cholecystectomy  2000  . Total hip arthroplasty  2003    right  . Cataract extraction  2009    both eyes  . Wrist surgery  2009    left    There were no vitals filed for this visit.  Visit Diagnosis:  Abnormality of gait  Unsteadiness      Subjective Assessment - 10/01/14 1108    Subjective Pt denies falls.  When asked about HEP, pt states, "I do some here and some there, but I get through it."   Patient is accompained by: Family member  daughter, Terry Gregory   Pertinent History Goes by [name]". PMH siginificant for: HTN, OA,CKD, R THR (2003)   Limitations Walking;Standing   Patient Stated Goals "I want to be able to walk further; and to be steadier on my feet."   Currently in Pain? No/denies                         [facility] Adult PT Treatment/Exercise - 10/02/14 0001    Ambulation/Gait   Ambulation/Gait Yes   Ambulation/Gait Assistance 6: Modified independent (Device/Increase time)    Ambulation Distance (Feet) 300 Feet  x2 (one each with and without cane)   Assistive device Straight cane   Gait Pattern Step-through pattern;Right flexed knee in stance;Left flexed knee in stance;Trunk flexed;Decreased trunk rotation  limited B hip extension throughout   Ambulation Surface Level;Indoor             Balance Exercises - 10/02/14 1055    Balance Exercises: Standing   Standing Eyes Opened Head turns;Foam/compliant surface;10 secs  horziontal x10' ; vertical x10 reps with (S)   Standing Eyes Closed Foam/compliant surface;30 secs;Other (comment)  Static without head turns 30 sec x4 trials. Added to HEP   OTAGO PROGRAM   Heel Toe Walking Backward --  10' x4 laps with finger tip support   Overall OTAGO Comments Reviewed exercise, per request of daughter           PT Education - 10/02/14 1052    Education provided Yes   Education Details Vestibular HEP   Person(s) Educated Patient;Child(ren)   Methods Explanation;Demonstration;Verbal cues;Handout   Comprehension Verbalized understanding;Returned demonstration;Verbal cues required;Need further instruction  May need to review          PT Short Term Goals - 09/28/14 1238    PT SHORT TERM GOAL #1  Title Pt will perform HEP with mod I using paper handout to indicate safe daily compliance with home exercises. Target date: 09/27/14   Status Achieved   PT SHORT TERM GOAL #2   Title Pt will ambulate x150' over level, indoor surface with mod I using SPC with correct sequencing of cane with gait. Target date: 09/27/14   Status Achieved   PT SHORT TERM GOAL #3   Title Pt will increase DGI score from 15 to 18/24 to indicate improved gait stability in the presence of external demands. Target date: 09/27/14   Baseline 20/24 on 09/28/14   Status Achieved           PT Long Term Goals - 10/02/14 1105    PT LONG TERM GOAL #1   Title Pt will verbalize understanding of fall prevention strategies to decrease fall risk  within home.  Target date: 10/25/14   Status On-going   PT LONG TERM GOAL #2   Title Pt will increase DGI score from 15 to 20/24 to indicate decreased fall risk. Target date: 10/25/14   Baseline DGI score: 20/24, per documentation on 8/16   Status Achieved   PT LONG TERM GOAL #3   Title Pt will ambulate 500' over unlevel asphalt and grassy surfaces with LRAD and mod I with no overt LOB. Target date: 10/25/14   Status On-going   PT LONG TERM GOAL #4   Title Pt will negotiate standard curb step with LRAD and mod I to indicate ability to traverse community obstacles. Target date: 10/25/14   Status Achieved   PT LONG TERM GOAL #5   Title Pt will increase Primary FOTO score from 42 to 52 to indicate improvement in patient-perceived functional status. Target date: 10/25/14   Status On-going               Plan - 10/02/14 1101    Clinical Impression Statement Session focused on standing balance, gaze stabilization. The folllowing additions were made to HEP: gaze stabilization exercises and standing on compliant surface with EC. Posterior preference again noted during vertical head turns on compliant surface. Continue per POC.   Pt will benefit from skilled therapeutic intervention in order to improve on the following deficits Decreased balance;Decreased strength;Impaired flexibility;Abnormal gait;Postural dysfunction;Decreased mobility;Decreased knowledge of use of DME;Decreased activity tolerance;Decreased coordination   Rehab Potential Good   Clinical Impairments Affecting Rehab Potential transportation limitations (daughter drives pt to appts and also assists with Hospice care for pt's wife)   PT Frequency 2x / week   PT Duration 8 weeks   PT Treatment/Interventions Vestibular;DME Instruction;Gait training;Stair training;Functional mobility training;Balance training;Therapeutic exercise;Therapeutic activities;Neuromuscular re-education;Patient/family education;Manual techniques;ADLs/Self Care  Home Management   PT Next Visit Plan Continue to address standing balance, dynamic gait; incorporate head turns, as safe/appropriate.   PT Bruce (have completed instruction).    Consulted and Agree with Plan of Care Patient;Family member/caregiver   Family Member Consulted daughter, Terry Gregory        Problem List Patient Active Problem List   Diagnosis Date Noted  . Pericardial effusion 09/30/2012  . RBBB and first degree atrioventricular block 09/30/2012  . HTN (hypertension) 09/30/2012    Billie Ruddy, PT, DPT [facility] [address] [place], [place], [address] Phone: [phone]   Fax:  [phone] 10/02/2014, 11:08 AM

## 2014-10-05 ENCOUNTER — Ambulatory Visit: Payer: Medicare Other | Admitting: Physical Therapy

## 2014-10-05 DIAGNOSIS — M25659 Stiffness of unspecified hip, not elsewhere classified: Secondary | ICD-10-CM | POA: Diagnosis not present

## 2014-10-05 DIAGNOSIS — R269 Unspecified abnormalities of gait and mobility: Secondary | ICD-10-CM | POA: Diagnosis not present

## 2014-10-05 DIAGNOSIS — R2681 Unsteadiness on feet: Secondary | ICD-10-CM

## 2014-10-05 NOTE — Therapy (Signed)
[facility] [address] [place], [place], [address] Phone: [phone]   Fax:  [phone]  Physical Therapy Treatment  Patient Details  Name: Terry Gregory MRN: [id number] Date of Birth: [birth year] Referring Provider:  Burnard Bunting, MD  Encounter Date: 10/05/2014      PT End of Session - 10/05/14 1243    Visit Number 9   Number of Visits 17   Date for PT Re-Evaluation 10/29/14   Authorization Type Medicare - G Codes every 10 visits   PT Start Time 1152   PT Stop Time 1231   PT Time Calculation (min) 39 min   Equipment Utilized During Treatment Gait belt   Activity Tolerance Patient tolerated treatment well   Behavior During Therapy [facility] for tasks assessed/performed      Past Medical History  Diagnosis Date  . Pericardial effusion   . Diverticulosis   . BPH (benign prostatic hyperplasia)     Past Surgical History  Procedure Laterality Date  . Appendectomy  1960  . Inguinal hernia repair  1980's  . Cholecystectomy  2000  . Total hip arthroplasty  2003    right  . Cataract extraction  2009    both eyes  . Wrist surgery  2009    left    There were no vitals filed for this visit.  Visit Diagnosis:  Abnormality of gait  Unsteadiness      Subjective Assessment - 10/05/14 1156    Subjective After seeing cardiologist on Friday (8/29), pt weaning off metraprolol (one more day) and starting lisinopril for BP management.   Patient is accompained by: Family member  daughter, [name]   Pertinent History Goes by [name]". PMH siginificant for: HTN, OA,CKD, R THR (2003)   Limitations Walking;Standing   Patient Stated Goals "I want to be able to walk further; and to be steadier on my feet."   Currently in Pain? No/denies                         [facility] Adult PT Treatment/Exercise - 10/05/14 0001    Transfers   Transfers Sit to Stand;Stand to Sit   Sit to Stand 6: Modified independent  (Device/Increase time)   Stand to Sit 6: Modified independent (Device/Increase time)   Ambulation/Gait   Ambulation/Gait Yes   Ambulation/Gait Assistance 4: Min guard;6: Modified independent (Device/Increase time);5: Supervision   Ambulation/Gait Assistance Details All gait performed wih SPC. Mod I over level, indoor surfaces; supervision over outdoor, paved surfaces, and min guard over unlevel, grassy surfaces. LOB with effective self-recovery with horizontal head turns over grassy surfaces.   Ambulation Distance (Feet) 550 Feet  and 115' x2 after each VOR x1 due to dizziness   Assistive device Straight cane   Gait Pattern Step-through pattern;Right flexed knee in stance;Left flexed knee in stance;Trunk flexed;Decreased trunk rotation  limited B hip extension throughout   Ambulation Surface Level;Unlevel;Indoor;Paved;Outdoor;Gravel;Grass   Curb 6: Modified independent (Device/increase time)  using [facility]         Vestibular Treatment/Exercise - 10/05/14 0001    Vestibular Treatment/Exercise   Vestibular Treatment Provided Gaze  Pt requested to sit due to dizziness after VOR x1 exercises   Gaze Exercises X1 Viewing Horizontal   X1 Viewing Horizontal   Foot Position shoulder width   Comments 60 seconds  reports 2/10 subjective dizziness afterward   X1 Viewing Vertical   Foot Position shoulder width   Comments 60 seconds  reports 7/10 subjective  dizziness afterward            Balance Exercises - 10/05/14 1238    Balance Exercises: Standing   Standing Eyes Opened Foam/compliant surface;Head turns;Other reps (comment)  horizontal, vertical, diagonal x10 reps each   Standing Eyes Closed Wide (BOA);Foam/compliant surface;30 secs   Wall Bumps Hip   Wall Bumps-Hips Eyes opened;Foam/compliant surface;20 reps   Overall Comments Other (comment)             PT Short Term Goals - 09/28/14 1238    PT SHORT TERM GOAL #1   Title Pt will perform HEP with mod I using paper handout  to indicate safe daily compliance with home exercises. Target date: 09/27/14   Status Achieved   PT SHORT TERM GOAL #2   Title Pt will ambulate x150' over level, indoor surface with mod I using SPC with correct sequencing of cane with gait. Target date: 09/27/14   Status Achieved   PT SHORT TERM GOAL #3   Title Pt will increase DGI score from 15 to 18/24 to indicate improved gait stability in the presence of external demands. Target date: 09/27/14   Baseline 20/24 on 09/28/14   Status Achieved           PT Long Term Goals - 10/05/14 1210    PT LONG TERM GOAL #1   Title Pt will verbalize understanding of fall prevention strategies to decrease fall risk within home.  Target date: 10/25/14   Status On-going   PT LONG TERM GOAL #2   Title Pt will increase DGI score from 15 to 20/24 to indicate decreased fall risk. Target date: 10/25/14   Baseline DGI score: 20/24, per documentation on 8/16   Status Achieved   PT LONG TERM GOAL #3   Title Pt will ambulate 500' over unlevel asphalt and grassy surfaces with LRAD and mod I with no overt LOB. Target date: 10/25/14   Status On-going   PT LONG TERM GOAL #4   Title Pt will negotiate standard curb step with LRAD and mod I to indicate ability to traverse community obstacles. Target date: 10/25/14   Baseline Met 8/23.   Status Achieved   PT LONG TERM GOAL #5   Title Pt will increase Primary FOTO score from 42 to 52 to indicate improvement in patient-perceived functional status. Target date: 10/25/14   Status On-going               Plan - 10/05/14 1244    Clinical Impression Statement Pt met LTG for curb step negotiation with mod I. Demonstrates decreased gait stability with head turns over unlevel surfaces. Subjective dizziness/disequilibrium did increase to 7/10 after x1 viewing exercises today. Continue per POC.   Pt will benefit from skilled therapeutic intervention in order to improve on the following deficits Decreased balance;Decreased  strength;Impaired flexibility;Abnormal gait;Postural dysfunction;Decreased mobility;Decreased knowledge of use of DME;Decreased activity tolerance;Decreased coordination   Rehab Potential Good   Clinical Impairments Affecting Rehab Potential transportation limitations (daughter drives pt to appts and also assists with Hospice care for pt's wife)   PT Frequency 2x / week   PT Duration 8 weeks   PT Treatment/Interventions Vestibular;DME Instruction;Gait training;Stair training;Functional mobility training;Balance training;Therapeutic exercise;Therapeutic activities;Neuromuscular re-education;Patient/family education;Manual techniques;ADLs/Self Care Home Management   PT Next Visit Plan GCODES. Dynamic gait, head turns while standing/walking over unlevel surfaces.   Consulted and Agree with Plan of Care Patient;Family member/caregiver   Family Member Consulted daughter, Olegario Shearer        Problem List  Patient Active Problem List   Diagnosis Date Noted  . Pericardial effusion 09/30/2012  . RBBB and first degree atrioventricular block 09/30/2012  . HTN (hypertension) 09/30/2012    Billie Ruddy, PT, DPT [facility] [address] [place], [place], [address] Phone: [phone]   Fax:  [phone] 10/05/2014, 12:49 PM

## 2014-10-08 ENCOUNTER — Ambulatory Visit: Payer: Medicare Other

## 2014-10-08 DIAGNOSIS — R269 Unspecified abnormalities of gait and mobility: Secondary | ICD-10-CM | POA: Diagnosis not present

## 2014-10-08 DIAGNOSIS — M25659 Stiffness of unspecified hip, not elsewhere classified: Secondary | ICD-10-CM | POA: Diagnosis not present

## 2014-10-08 DIAGNOSIS — R2681 Unsteadiness on feet: Secondary | ICD-10-CM | POA: Diagnosis not present

## 2014-10-08 NOTE — Therapy (Signed)
[facility] [address] [place], [place], [address] Phone: [phone]   Fax:  [phone]  Physical Therapy Treatment  Patient Details  Name: Terry Gregory MRN: [id number] Date of Birth: May 21, 1921 Referring Provider:  Burnard Bunting, MD  Encounter Date: 10/08/2014      PT End of Session - 10/08/14 1105    Visit Number 10   Number of Visits 17   Date for PT Re-Evaluation 10/29/14   Authorization Type Medicare - G Codes every 10 visits   PT Start Time 1016   PT Stop Time 1056   PT Time Calculation (min) 40 min   Equipment Utilized During Treatment Gait belt   Activity Tolerance Patient tolerated treatment well   Behavior During Therapy [facility] for tasks assessed/performed      Past Medical History  Diagnosis Date  . Pericardial effusion   . Diverticulosis   . BPH (benign prostatic hyperplasia)     Past Surgical History  Procedure Laterality Date  . Appendectomy  1960  . Inguinal hernia repair  1980's  . Cholecystectomy  2000  . Total hip arthroplasty  2003    right  . Cataract extraction  2009    both eyes  . Wrist surgery  2009    left    There were no vitals filed for this visit.  Visit Diagnosis:  Abnormality of gait  Unsteadiness      Subjective Assessment - 10/08/14 1018    Subjective Pt deneid falls or changes since last visit. No dizziness since last session.   Patient is accompained by: Family member  dtr, Jocelyn Lamer   Pertinent History Goes by [name]". PMH siginificant for: HTN, OA,CKD, R THR (2003)   Limitations Walking;Standing   Patient Stated Goals "I want to be able to walk further; and to be steadier on my feet."   Currently in Pain? No/denies                         [facility] Adult PT Treatment/Exercise - 10/08/14 1019    Ambulation/Gait   Ambulation/Gait Yes   Ambulation/Gait Assistance 5: Supervision;4: Min guard;6: Modified independent (Device/Increase time)    Ambulation/Gait Assistance Details Min guard during amb. over grassy terrain and cues to maintain upright posture, look straight ahead, and improve stride length over uneven terrain. Supervison over paved surfaces without SPC. Pt MOD I with SPC over even surfaces.   Ambulation Distance (Feet) --  400' indoors and 600' outdoors   Assistive device Straight cane;None   Gait Pattern Step-through pattern;Right flexed knee in stance;Left flexed knee in stance;Trunk flexed;Decreased trunk rotation   Ambulation Surface Level;Unlevel;Indoor;Outdoor;Paved;Grass;Gravel;Other (comment)  rubber mulch   Balance   Balance Assessed Yes   Standardized Balance Assessment   Standardized Balance Assessment Dynamic Gait Index   Dynamic Gait Index   Level Surface Normal   Change in Gait Speed Normal   Gait with Horizontal Head Turns Mild Impairment   Gait with Vertical Head Turns Normal   Gait and Pivot Turn Normal   Step Over Obstacle Mild Impairment   Step Around Obstacles Normal   Steps Mild Impairment  can ascend without handrail, but need rails to descend.   Total Score 21          Therex: Seated: B LAQ, 3x10 with green band. Cues and demonstration for technique. See pt instructions for details.      PT Education - 10/08/14 1105    Education provided Yes  Education Details Long arc quad HEP. PT also discussed the importance of using SPC at all times during amb. for safety.   Person(s) Educated Child(ren)   Methods Explanation;Demonstration;Verbal cues;Handout   Comprehension Returned demonstration;Verbalized understanding          PT Short Term Goals - 09/28/14 1238    PT SHORT TERM GOAL #1   Title Pt will perform HEP with mod I using paper handout to indicate safe daily compliance with home exercises. Target date: 09/27/14   Status Achieved   PT SHORT TERM GOAL #2   Title Pt will ambulate x150' over level, indoor surface with mod I using SPC with correct sequencing of cane with  gait. Target date: 09/27/14   Status Achieved   PT SHORT TERM GOAL #3   Title Pt will increase DGI score from 15 to 18/24 to indicate improved gait stability in the presence of external demands. Target date: 09/27/14   Baseline 20/24 on 09/28/14   Status Achieved           PT Long Term Goals - 10/05/14 1210    PT LONG TERM GOAL #1   Title Pt will verbalize understanding of fall prevention strategies to decrease fall risk within home.  Target date: 10/25/14   Status On-going   PT LONG TERM GOAL #2   Title Pt will increase DGI score from 15 to 20/24 to indicate decreased fall risk. Target date: 10/25/14   Baseline DGI score: 20/24, per documentation on 8/16   Status Achieved   PT LONG TERM GOAL #3   Title Pt will ambulate 500' over unlevel asphalt and grassy surfaces with LRAD and mod I with no overt LOB. Target date: 10/25/14   Status On-going   PT LONG TERM GOAL #4   Title Pt will negotiate standard curb step with LRAD and mod I to indicate ability to traverse community obstacles. Target date: 10/25/14   Baseline Met 8/23.   Status Achieved   PT LONG TERM GOAL #5   Title Pt will increase Primary FOTO score from 42 to 52 to indicate improvement in patient-perceived functional status. Target date: 10/25/14   Status On-going               Plan - 10/08/14 1106    Clinical Impression Statement Pt demonstrated progress towards goals, as he was able to ambulate over uneven terrain with and without SPC, without over LOB. However, pt did require min guard during amb. over grassy terrain and no SPC to ensure safety. PT added LAQ HEP with green band, as pt reported B knees "just give way" during ambulation due to weakness and LAQ was missing from [employer]. Conitnue with POC.   Pt will benefit from skilled therapeutic intervention in order to improve on the following deficits Decreased balance;Decreased strength;Impaired flexibility;Abnormal gait;Postural dysfunction;Decreased  mobility;Decreased knowledge of use of DME;Decreased activity tolerance;Decreased coordination   Rehab Potential Good   Clinical Impairments Affecting Rehab Potential transportation limitations (daughter drives pt to appts and also assists with Hospice care for pt's wife)   PT Frequency 2x / week   PT Duration 8 weeks   PT Treatment/Interventions Vestibular;DME Instruction;Gait training;Stair training;Functional mobility training;Balance training;Therapeutic exercise;Therapeutic activities;Neuromuscular re-education;Patient/family education;Manual techniques;ADLs/Self Care Home Management   PT Next Visit Plan Dynamic gait, head turns while standing/walking over unlevel surfaces.   PT [place] (have completed instruction).    Consulted and Agree with Plan of Care Patient;Family member/caregiver   Family Member Consulted daughter, Olegario Shearer  G-Codes - 10/08/14 1109    Functional Assessment Tool Used DGI score: 21/24   Functional Limitation Mobility: Walking and moving around   Mobility: Walking and Moving Around Current Status [phone]) At least 1 percent but less than 20 percent impaired, limited or restricted   Mobility: Walking and Moving Around Goal Status [phone]) At least 1 percent but less than 20 percent impaired, limited or restricted      Problem List Patient Active Problem List   Diagnosis Date Noted  . Pericardial effusion 09/30/2012  . RBBB and first degree atrioventricular block 09/30/2012  . HTN (hypertension) 09/30/2012    Yaeko Fazekas L 10/08/2014, 11:13 AM  [facility] [address] [place], [place], [address] Phone: [phone]   Fax:  [phone]   Physical Therapy Progress Note  Dates of Reporting Period: 08/30/14 to 10/08/14  Objective Reports of Subjective Statement: Pt is able to ambulate longer distances, with less assist.  Objective Measurements: 21/24 on DGI.  Goal  Update:      PT Short Term Goals - 09/28/14 1238    PT SHORT TERM GOAL #1   Title Pt will perform HEP with mod I using paper handout to indicate safe daily compliance with home exercises. Target date: 09/27/14   Status Achieved   PT SHORT TERM GOAL #2   Title Pt will ambulate x150' over level, indoor surface with mod I using SPC with correct sequencing of cane with gait. Target date: 09/27/14   Status Achieved   PT SHORT TERM GOAL #3   Title Pt will increase DGI score from 15 to 18/24 to indicate improved gait stability in the presence of external demands. Target date: 09/27/14   Baseline 20/24 on 09/28/14   Status Achieved         PT Long Term Goals - 10/05/14 1210    PT LONG TERM GOAL #1   Title Pt will verbalize understanding of fall prevention strategies to decrease fall risk within home.  Target date: 10/25/14   Status On-going   PT LONG TERM GOAL #2   Title Pt will increase DGI score from 15 to 20/24 to indicate decreased fall risk. Target date: 10/25/14   Baseline DGI score: 20/24, per documentation on 8/16   Status Achieved   PT LONG TERM GOAL #3   Title Pt will ambulate 500' over unlevel asphalt and grassy surfaces with LRAD and mod I with no overt LOB. Target date: 10/25/14   Status On-going   PT LONG TERM GOAL #4   Title Pt will negotiate standard curb step with LRAD and mod I to indicate ability to traverse community obstacles. Target date: 10/25/14   Baseline Met 8/23.   Status Achieved   PT LONG TERM GOAL #5   Title Pt will increase Primary FOTO score from 42 to 52 to indicate improvement in patient-perceived functional status. Target date: 10/25/14   Status On-going       Plan: Continue towards goals.  Reason Skilled Services are Required: To improve balance, strength, endurance, and safety during amb.    Geoffry Paradise, PT,DPT 10/08/2014 11:13 AM Phone: [phone] Fax: [phone]

## 2014-10-08 NOTE — Patient Instructions (Signed)
KNEE: Extension, Long Arc Quad (Band)   Place band around leg and under other foot. Pull band forward until knee is straight. Hold _2__ seconds. Use __green______ band. Repeat with both legs. _10__ reps per set, _3__ sets per day, _3__ days per week.   Copyright  VHI. All rights reserved.

## 2014-10-12 ENCOUNTER — Ambulatory Visit: Payer: Medicare Other | Admitting: Physical Therapy

## 2014-10-12 DIAGNOSIS — R269 Unspecified abnormalities of gait and mobility: Secondary | ICD-10-CM | POA: Diagnosis not present

## 2014-10-12 DIAGNOSIS — R2681 Unsteadiness on feet: Secondary | ICD-10-CM | POA: Diagnosis not present

## 2014-10-12 DIAGNOSIS — M25659 Stiffness of unspecified hip, not elsewhere classified: Secondary | ICD-10-CM | POA: Diagnosis not present

## 2014-10-12 NOTE — Therapy (Signed)
[facility] [address] [place], [place], [address] Phone: [phone]   Fax:  [phone]  Physical Therapy Treatment  Patient Details  Name: Terry Gregory MRN: [id number] Date of Birth: 23-May-1921 Referring Provider:  Burnard Bunting, MD  Encounter Date: 10/12/2014      PT End of Session - 10/12/14 1759    Visit Number 11   Number of Visits 17   Date for PT Re-Evaluation 10/29/14   Authorization Type Medicare - G Codes every 10 visits   PT Start Time 1149   PT Stop Time 5631   PT Time Calculation (min) 46 min   Equipment Utilized During Treatment Gait belt   Activity Tolerance Patient tolerated treatment well   Behavior During Therapy [facility] for tasks assessed/performed      Past Medical History  Diagnosis Date  . Pericardial effusion   . Diverticulosis   . BPH (benign prostatic hyperplasia)     Past Surgical History  Procedure Laterality Date  . Appendectomy  1960  . Inguinal hernia repair  1980's  . Cholecystectomy  2000  . Total hip arthroplasty  2003    right  . Cataract extraction  2009    both eyes  . Wrist surgery  2009    left    There were no vitals filed for this visit.  Visit Diagnosis:  Abnormality of gait  Unsteadiness      Subjective Assessment - 10/12/14 1750    Subjective "I think the last therapist misunderstood me when I said my knees give out when I'm walking. What I meant was that I get tired." Pt denies falls and reports no pain.   Patient is accompained by: Family member   Pertinent History Goes by [name]". PMH siginificant for: HTN, OA,CKD, R THR (2003)   Limitations Walking;Standing   Patient Stated Goals "I want to be able to walk further; and to be steadier on my feet."   Currently in Pain? No/denies                         [facility] Adult PT Treatment/Exercise - 10/12/14 0001    Ambulation/Gait   Ambulation/Gait Yes   Ambulation/Gait Assistance 5:  Supervision;6: Modified independent (Device/Increase time);4: Min guard;4: Min assist   Ambulation/Gait Assistance Details (S) for gait over unlevel, paved surfaces; min guard for gait over grass. Min guard to min A for dynamic gait outdoors (see comments below). noted increasingly less thoracic spine/B hip extension with increased distance ambulated.   Ambulation Distance (Feet) 800 Feet  650' outdoors   Assistive device Straight cane;None   Gait Pattern Step-through pattern;Right flexed knee in stance;Left flexed knee in stance;Trunk flexed;Decreased stride length   Ambulation Surface Level;Indoor;Outdoor;Unlevel;Paved;Grass   Gait Comments Utilized gait trainer on treadmill for visual feedback of limited B step length. After 6 consecutive minutes, pt able to achieve normalized B step length for his height. Pt performed the following dynamic gait activities over grass x20 reps each with min guard-min A for stability/balance: retro gait, B sidestepping, braiding in B directions, high knees.  no AD, no UE support   Exercises   Exercises Other Exercises   Other Exercises  Performed the following exercises for increased postural awareness/improved postural alignment: scapular rows x20 reps, x10 reps with multimodal cueing for scapular retraction.                PT Education - 10/12/14 1751    Education provided Yes  Education Details Discussed importance of ongoing fitness program after D/C from therapy. Recommended pt attempt to sit in chair without back support and progress as tolerated to increase endurance in spinal extensors.   Person(s) Educated Patient;Child(ren)   Methods Explanation;Demonstration   Comprehension Verbalized understanding          PT Short Term Goals - 09/28/14 1238    PT SHORT TERM GOAL #1   Title Pt will perform HEP with mod I using paper handout to indicate safe daily compliance with home exercises. Target date: 09/27/14   Status Achieved   PT SHORT TERM  GOAL #2   Title Pt will ambulate x150' over level, indoor surface with mod I using SPC with correct sequencing of cane with gait. Target date: 09/27/14   Status Achieved   PT SHORT TERM GOAL #3   Title Pt will increase DGI score from 15 to 18/24 to indicate improved gait stability in the presence of external demands. Target date: 09/27/14   Baseline 20/24 on 09/28/14   Status Achieved           PT Long Term Goals - 10/05/14 1210    PT LONG TERM GOAL #1   Title Pt will verbalize understanding of fall prevention strategies to decrease fall risk within home.  Target date: 10/25/14   Status On-going   PT LONG TERM GOAL #2   Title Pt will increase DGI score from 15 to 20/24 to indicate decreased fall risk. Target date: 10/25/14   Baseline DGI score: 20/24, per documentation on 8/16   Status Achieved   PT LONG TERM GOAL #3   Title Pt will ambulate 500' over unlevel asphalt and grassy surfaces with LRAD and mod I with no overt LOB. Target date: 10/25/14   Status On-going   PT LONG TERM GOAL #4   Title Pt will negotiate standard curb step with LRAD and mod I to indicate ability to traverse community obstacles. Target date: 10/25/14   Baseline Met 8/23.   Status Achieved   PT LONG TERM GOAL #5   Title Pt will increase Primary FOTO score from 42 to 52 to indicate improvement in patient-perceived functional status. Target date: 10/25/14   Status On-going               Plan - 10/12/14 1759    Clinical Impression Statement Session focused on gait training with emphasis on increasing pt awareness of postural alignment and B step legnth during gait. Noted increasingly more limited extension of thoracic spine, B hips with increased distance ambulated. Continue per POC.    Pt will benefit from skilled therapeutic intervention in order to improve on the following deficits Decreased balance;Decreased strength;Impaired flexibility;Abnormal gait;Postural dysfunction;Decreased mobility;Decreased  knowledge of use of DME;Decreased activity tolerance;Decreased coordination   Rehab Potential Good   Clinical Impairments Affecting Rehab Potential transportation limitations (daughter drives pt to appts and also assists with Hospice care for pt's wife)   PT Frequency 2x / week   PT Duration 8 weeks   PT Treatment/Interventions Vestibular;DME Instruction;Gait training;Stair training;Functional mobility training;Balance training;Therapeutic exercise;Therapeutic activities;Neuromuscular re-education;Patient/family education;Manual techniques;ADLs/Self Care Home Management   PT Next Visit Plan Increase endurance of thoracic spine/hip extensors. Dynamic gait, head turns while standing/walking over unlevel surfaces.   PT [place] (have completed instruction).    Consulted and Agree with Plan of Care Patient;Family member/caregiver   Family Member Consulted daughter, Olegario Shearer        Problem List Patient Active Problem List  Diagnosis Date Noted  . Pericardial effusion 09/30/2012  . RBBB and first degree atrioventricular block 09/30/2012  . HTN (hypertension) 09/30/2012    Billie Ruddy, PT, DPT [facility] [address] [place], [place], [address] Phone: [phone]   Fax:  [phone] 10/12/2014, 6:04 PM

## 2014-10-15 ENCOUNTER — Ambulatory Visit: Payer: Medicare Other | Attending: Internal Medicine | Admitting: Physical Therapy

## 2014-10-15 DIAGNOSIS — R2681 Unsteadiness on feet: Secondary | ICD-10-CM | POA: Insufficient documentation

## 2014-10-15 DIAGNOSIS — R269 Unspecified abnormalities of gait and mobility: Secondary | ICD-10-CM

## 2014-10-15 DIAGNOSIS — M25659 Stiffness of unspecified hip, not elsewhere classified: Secondary | ICD-10-CM | POA: Insufficient documentation

## 2014-10-15 NOTE — Therapy (Signed)
[facility] [address] [place], [place], [address] Phone: [phone]   Fax:  [phone]  Physical Therapy Treatment  Patient Details  Name: Terry Gregory MRN: [id number] Date of Birth: [birth year] Referring Provider:  Burnard Bunting, MD  Encounter Date: 10/15/2014      PT End of Session - 10/15/14 1929    Visit Number 12   Number of Visits 17   Date for PT Re-Evaluation 10/29/14   Authorization Type Medicare - G Codes every 10 visits   PT Start Time 1102   PT Stop Time 1152   PT Time Calculation (min) 50 min   Equipment Utilized During Treatment Gait belt   Activity Tolerance Patient tolerated treatment well   Behavior During Therapy [name] for tasks assessed/performed      Past Medical History  Diagnosis Date  . Pericardial effusion   . Diverticulosis   . BPH (benign prostatic hyperplasia)     Past Surgical History  Procedure Laterality Date  . Appendectomy  1960  . Inguinal hernia repair  1980's  . Cholecystectomy  2000  . Total hip arthroplasty  2003    right  . Cataract extraction  2009    both eyes  . Wrist surgery  2009    left    There were no vitals filed for this visit.  Visit Diagnosis:  Abnormality of gait  Unsteadiness      Subjective Assessment - 10/15/14 1108    Subjective Pt continues to describe difficulty walking long distances due to fatigue. Pt unable to join gym due to transportation limitations, as daughter is unable to drive pt. When asked if there is a flat surface on which pt could safely walk, pt/daughter report that a neighbor has a long driveway pt could walk on.   Patient is accompained by: Family member   Pertinent History Goes by [name]". PMH siginificant for: HTN, OA,CKD, R THR (2003)   Limitations Walking;Standing   Patient Stated Goals "I want to be able to walk further; and to be steadier on my feet."   Currently in Pain? No/denies            [facility] PT  Assessment - 10/15/14 0001    Flexibility   Soft Tissue Assessment /Muscle Length yes  Limited L gastroc/soleus extensibility                     [facility] Adult PT Treatment/Exercise - 10/15/14 0001    Ambulation/Gait   Ambulation/Gait Yes   Ambulation/Gait Assistance 6: Modified independent (Device/Increase time)   Ambulation Distance (Feet) 795 Feet   Assistive device Straight cane   Gait Pattern Step-through pattern;Right flexed knee in stance;Left flexed knee in stance;Trunk flexed;Decreased stride length   Ambulation Surface Level;Indoor   Gait Comments Due to pt report of continued limitation of walking tolerance, asked pt to walk as far as possible until needing to sit down. Pt ambulated x795' (7 minutes, 58 seconds) with SPC prior to requesting seated rest break due to pt perception of "starting to wobble a little bit."   Exercises   Exercises Other Exercises   Other Exercises  Instructed pt in standing gastrocnemius stretch 2 x60-second holds per side with BUE support on countertop. Pt performed exercise with verbal, demonstration cueing. Modified HEP to exclude seated hamstring/calf stretch and to add standing gastroc stretch.             Balance Exercises - 10/15/14 [place]  Head Movements 5 reps;Standing   Neck Movements Standing;5 reps   Back Extension Standing;5 reps   Trunk Movements Standing;5 reps   Ankle Movements 10 reps;Sitting   Hip ABductor 10 reps  cueing for technique   Ankle Plantorflexors 20 reps, support   Ankle Dorsiflexors 20 reps, support  noted minimal L ankle dorsiflexion           PT Education - 10/15/14 1928    Education provided Yes   Education Details Walking program. Modified calf stretch from seated to standing (see Pt Instructions).    Person(s) Educated Patient;Child(ren)   Methods Explanation;Demonstration;Handout;Verbal cues   Comprehension Verbalized understanding;Returned demonstration           PT Short Term Goals - 09/28/14 1238    PT SHORT TERM GOAL #1   Title Pt will perform HEP with mod I using paper handout to indicate safe daily compliance with home exercises. Target date: 09/27/14   Status Achieved   PT SHORT TERM GOAL #2   Title Pt will ambulate x150' over level, indoor surface with mod I using SPC with correct sequencing of cane with gait. Target date: 09/27/14   Status Achieved   PT SHORT TERM GOAL #3   Title Pt will increase DGI score from 15 to 18/24 to indicate improved gait stability in the presence of external demands. Target date: 09/27/14   Baseline 20/24 on 09/28/14   Status Achieved           PT Long Term Goals - 10/05/14 1210    PT LONG TERM GOAL #1   Title Pt will verbalize understanding of fall prevention strategies to decrease fall risk within home.  Target date: 10/25/14   Status On-going   PT LONG TERM GOAL #2   Title Pt will increase DGI score from 15 to 20/24 to indicate decreased fall risk. Target date: 10/25/14   Baseline DGI score: 20/24, per documentation on 8/16   Status Achieved   PT LONG TERM GOAL #3   Title Pt will ambulate 500' over unlevel asphalt and grassy surfaces with LRAD and mod I with no overt LOB. Target date: 10/25/14   Status On-going   PT LONG TERM GOAL #4   Title Pt will negotiate standard curb step with LRAD and mod I to indicate ability to traverse community obstacles. Target date: 10/25/14   Baseline Met 8/23.   Status Achieved   PT LONG TERM GOAL #5   Title Pt will increase Primary FOTO score from 42 to 52 to indicate improvement in patient-perceived functional status. Target date: 10/25/14   Status On-going               Plan - 10/15/14 1935    Clinical Impression Statement Due to ongoing pt report of decreased walking distance, assessed walking tolerance and determined pt able to ambulate for approximately 8 minutes, < 800' consecutively. Initiated walking program. Noted L ankle dorsiflexion limited by decreased  extensibility of L gastroc/soleus; HEP modified to address this. Continue per POC.   Pt will benefit from skilled therapeutic intervention in order to improve on the following deficits Decreased balance;Decreased strength;Impaired flexibility;Abnormal gait;Postural dysfunction;Decreased mobility;Decreased knowledge of use of DME;Decreased activity tolerance;Decreased coordination   Rehab Potential Good   Clinical Impairments Affecting Rehab Potential transportation limitations (daughter drives pt to appts and also assists with Hospice care for pt's wife)   PT Frequency 2x / week   PT Duration 8 weeks   PT Treatment/Interventions Vestibular;DME Instruction;Gait training;Stair training;Functional mobility  training;Balance training;Therapeutic exercise;Therapeutic activities;Neuromuscular re-education;Patient/family education;Manual techniques;ADLs/Self Care Home Management   PT Next Visit Plan Ensure safe performance of [place] HEP due to upcoming D/C, multiple pt questions about [place]. Increase endurance of thoracic spine/hip extensors. Dynamic gait, head turns while standing/walking over unlevel surfaces.   PT Home Exercise Plan OTAGO   Consulted and Agree with Plan of Care Patient;Family member/caregiver   Family Member Consulted daughter, Olegario Shearer        Problem List Patient Active Problem List   Diagnosis Date Noted  . Pericardial effusion 09/30/2012  . RBBB and first degree atrioventricular block 09/30/2012  . HTN (hypertension) 09/30/2012    Billie Ruddy, PT, DPT [facility] [address] [place], [place], [address] Phone: [phone]   Fax:  [phone] 10/15/2014, 7:40 PM

## 2014-10-15 NOTE — Patient Instructions (Addendum)
Walking Program:  Begin walking for exercise for 8 consecutive minutes, 1 time/day, 3-5 days/week.   Progress your walking program by adding 1 minute to your routine each week, as tolerated. Be sure to wear good walking shoes, use your cane, walk in a safe environment (level driveway) and only progress to your tolerance.         Hamstring Stretch, Seated (Strap, Two Chairs)  each leg.  Achilles / Gastroc, Standing   Stand, right foot behind, heel on floor and turned slightly out, leg straight, forward leg bent. Move hips forward. Hold _60__ seconds.  Do __2_ sessions per day.  Copyright  VHI. All rights reserved.

## 2014-10-19 ENCOUNTER — Ambulatory Visit: Payer: Medicare Other | Admitting: Physical Therapy

## 2014-10-19 ENCOUNTER — Encounter: Payer: Self-pay | Admitting: Physical Therapy

## 2014-10-19 DIAGNOSIS — R269 Unspecified abnormalities of gait and mobility: Secondary | ICD-10-CM | POA: Diagnosis not present

## 2014-10-19 DIAGNOSIS — M25659 Stiffness of unspecified hip, not elsewhere classified: Secondary | ICD-10-CM

## 2014-10-19 DIAGNOSIS — R2681 Unsteadiness on feet: Secondary | ICD-10-CM

## 2014-10-19 NOTE — Therapy (Signed)
[facility] [address] [place], [place], [address] Phone: [phone]   Fax:  [phone]  Physical Therapy Treatment  Patient Details  Name: Terry Gregory MRN: [id number] Date of Birth: Jan 05, 1922 Referring Provider:  Burnard Bunting, MD  Encounter Date: 10/19/2014      PT End of Session - 10/19/14 1024    Visit Number 13   Number of Visits 17   Date for PT Re-Evaluation 10/29/14   Authorization Type Medicare - G Codes every 10 visits   PT Start Time 1017   PT Stop Time 1058   PT Time Calculation (min) 41 min   Equipment Utilized During Treatment Gait belt   Activity Tolerance Patient tolerated treatment well   Behavior During Therapy [facility] for tasks assessed/performed      Past Medical History  Diagnosis Date  . Pericardial effusion   . Diverticulosis   . BPH (benign prostatic hyperplasia)     Past Surgical History  Procedure Laterality Date  . Appendectomy  1960  . Inguinal hernia repair  1980's  . Cholecystectomy  2000  . Total hip arthroplasty  2003    right  . Cataract extraction  2009    both eyes  . Wrist surgery  2009    left    There were no vitals filed for this visit.  Visit Diagnosis:  Abnormality of gait  Unsteadiness  Stiffness of hip joint, unspecified laterality      Subjective Assessment - 10/19/14 1023    Subjective No new complaints. No falls or pain to report.    Pertinent History Goes by [name]". PMH siginificant for: HTN, OA,CKD, R THR (2003)   Currently in Pain? No/denies           Balance Exercises - 10/19/14 1035    OTAGO PROGRAM   Head Movements Standing;5 reps   Neck Movements Standing;5 reps   Back Extension Standing;5 reps   Trunk Movements Standing;5 reps   Ankle Movements Sitting;10 reps   Hip ABductor 10 reps  alternating legs   Ankle Plantorflexors 20 reps, support   Ankle Dorsiflexors 20 reps, support   Knee Bends 10 reps, support   Backwards  Walking Support   Sideways Walking No assistive device   Tandem Stance 10 seconds, no support   Tandem Walk Support   One Leg Stand 10 seconds, no support   Heel Walking No support   Toe Walk No support   Heel Toe Walking Backward No support  support on counter for 10 feet x 2 reps   Sit to Stand 10 reps, no support  elevated mat to simulate bed height           PT Short Term Goals - 09/28/14 1238    PT SHORT TERM GOAL #1   Title Pt will perform HEP with mod I using paper handout to indicate safe daily compliance with home exercises. Target date: 09/27/14   Status Achieved   PT SHORT TERM GOAL #2   Title Pt will ambulate x150' over level, indoor surface with mod I using SPC with correct sequencing of cane with gait. Target date: 09/27/14   Status Achieved   PT SHORT TERM GOAL #3   Title Pt will increase DGI score from 15 to 18/24 to indicate improved gait stability in the presence of external demands. Target date: 09/27/14   Baseline 20/24 on 09/28/14   Status Achieved           PT Long Term  Goals - 10/05/14 1210    PT LONG TERM GOAL #1   Title Pt will verbalize understanding of fall prevention strategies to decrease fall risk within home.  Target date: 10/25/14   Status On-going   PT LONG TERM GOAL #2   Title Pt will increase DGI score from 15 to 20/24 to indicate decreased fall risk. Target date: 10/25/14   Baseline DGI score: 20/24, per documentation on 8/16   Status Achieved   PT LONG TERM GOAL #3   Title Pt will ambulate 500' over unlevel asphalt and grassy surfaces with LRAD and mod I with no overt LOB. Target date: 10/25/14   Status On-going   PT LONG TERM GOAL #4   Title Pt will negotiate standard curb step with LRAD and mod I to indicate ability to traverse community obstacles. Target date: 10/25/14   Baseline Met 8/23.   Status Achieved   PT LONG TERM GOAL #5   Title Pt will increase Primary FOTO score from 42 to 52 to indicate improvement in patient-perceived  functional status. Target date: 10/25/14   Status On-going           Plan - 10/19/14 1024    Clinical Impression Statement Consolidated pt's HEP (everything issued to date) by removing the ex's that have been stopped and placing all others in OTAGO folder behind the [place] program. Pt able to perform the entire OTAGO program with minimal cueing needed on form and technique.   Pt will benefit from skilled therapeutic intervention in order to improve on the following deficits Decreased balance;Decreased strength;Impaired flexibility;Abnormal gait;Postural dysfunction;Decreased mobility;Decreased knowledge of use of DME;Decreased activity tolerance;Decreased coordination   Rehab Potential Good   Clinical Impairments Affecting Rehab Potential transportation limitations (daughter drives pt to appts and also assists with Hospice care for pt's wife)   PT Frequency 2x / week   PT Duration 8 weeks   PT Treatment/Interventions Vestibular;DME Instruction;Gait training;Stair training;Functional mobility training;Balance training;Therapeutic exercise;Therapeutic activities;Neuromuscular re-education;Patient/family education;Manual techniques;ADLs/Self Care Home Management   PT Next Visit Plan . Increase endurance of thoracic spine/hip extensors. Dynamic gait, head turns while standing/walking over unlevel surfaces.   PT Home Exercise Plan OTAGO   Consulted and Agree with Plan of Care Patient;Family member/caregiver   Family Member Consulted daughter, Olegario Shearer        Problem List Patient Active Problem List   Diagnosis Date Noted  . Pericardial effusion 09/30/2012  . RBBB and first degree atrioventricular block 09/30/2012  . HTN (hypertension) 09/30/2012    Willow Ora 10/20/2014, 3:14 PM  Willow Ora, PTA, [facility] [address] [place], [place] [address] [phone] 10/20/2014, 3:14 PM

## 2014-10-22 ENCOUNTER — Ambulatory Visit: Payer: Medicare Other | Admitting: Physical Therapy

## 2014-10-22 DIAGNOSIS — R2681 Unsteadiness on feet: Secondary | ICD-10-CM

## 2014-10-22 DIAGNOSIS — M25659 Stiffness of unspecified hip, not elsewhere classified: Secondary | ICD-10-CM | POA: Diagnosis not present

## 2014-10-22 DIAGNOSIS — R269 Unspecified abnormalities of gait and mobility: Secondary | ICD-10-CM | POA: Diagnosis not present

## 2014-10-22 NOTE — Patient Instructions (Signed)
To progress Gaze Stabilization exercise (moving head side-to-side and up and down while looking at "A"): - Increase the time by 30 seconds until you're able to perform for 2 consecutive minutes. - Then, put a busy background behind the "A" (wrapping paper) - You can also progress by placing feet together or in tandem (heel to toe), then performing exercise.  Pectoralis Stretch: Supine (Towel Roll)   Lie with rolled towel under spine, letting shoulders relax toward floor. Hold for up to 5 minutes at a time. If your back begins to hurt or have any numbness/tingling, stop this stretch.   Copyright  VHI. All rights reserved.

## 2014-10-23 NOTE — Therapy (Signed)
[facility] [address] [place], [place], [address] Phone: [phone]   Fax:  [phone]  Physical Therapy Treatment  Patient Details  Name: Terry Gregory MRN: [id number] Date of Birth: [birth year] Referring Provider:  Burnard Bunting, MD  Encounter Date: 10/22/2014      PT End of Session - 10/23/14 1435    Visit Number 14   Number of Visits 17   Date for PT Re-Evaluation 10/29/14   Authorization Type Medicare - G Codes every 10 visits   PT Start Time 1105   PT Stop Time 1145   PT Time Calculation (min) 40 min   Activity Tolerance Patient tolerated treatment well   Behavior During Therapy [facility] for tasks assessed/performed      Past Medical History  Diagnosis Date  . Pericardial effusion   . Diverticulosis   . BPH (benign prostatic hyperplasia)     Past Surgical History  Procedure Laterality Date  . Appendectomy  1960  . Inguinal hernia repair  1980's  . Cholecystectomy  2000  . Total hip arthroplasty  2003    right  . Cataract extraction  2009    both eyes  . Wrist surgery  2009    left    There were no vitals filed for this visit.  Visit Diagnosis:  Abnormality of gait  Unsteadiness      Subjective Assessment - 10/22/14 1108    Subjective Pt denies falls and reports no pain. Pt has been walking on his driveway. During discussion of upcoming end of POC, pt/daughter feel as though balance has improved markedly, endurance is improving, but postural alignment continues to be an issue.   Patient is accompained by: Family member   Pertinent History Goes by [name]". PMH siginificant for: HTN, OA,CKD, R THR (2003)   Limitations Walking;Standing   Patient Stated Goals "I want to be able to walk further; and to be steadier on my feet."   Currently in Pain? No/denies                                 PT Education - 10/23/14 1422    Education provided Yes   Education Details  HEP: added exercises to increase extensibility of pectorals for improved postual alignment; discussed progression of gaze stabilization exercise after PT discontinued.   Person(s) Educated Patient;Child(ren)   Methods Explanation;Demonstration;Verbal cues;Handout   Comprehension Verbalized understanding;Returned demonstration          PT Short Term Goals - 09/28/14 1238    PT SHORT TERM GOAL #1   Title Pt will perform HEP with mod I using paper handout to indicate safe daily compliance with home exercises. Target date: 09/27/14   Status Achieved   PT SHORT TERM GOAL #2   Title Pt will ambulate x150' over level, indoor surface with mod I using SPC with correct sequencing of cane with gait. Target date: 09/27/14   Status Achieved   PT SHORT TERM GOAL #3   Title Pt will increase DGI score from 15 to 18/24 to indicate improved gait stability in the presence of external demands. Target date: 09/27/14   Baseline 20/24 on 09/28/14   Status Achieved           PT Long Term Goals - 10/22/14 1111    PT LONG TERM GOAL #1   Title Pt will verbalize understanding of fall prevention strategies to decrease fall risk within home.  Target date: 10/25/14   Status On-going   PT LONG TERM GOAL #2   Title Pt will increase DGI score from 15 to 20/24 to indicate decreased fall risk. Target date: 10/25/14   Baseline DGI score: 20/24, per documentation on 8/16   Status Achieved   PT LONG TERM GOAL #3   Title Pt will ambulate 500' over unlevel asphalt and grassy surfaces with LRAD and mod I with no overt LOB. Target date: 10/25/14   Status Achieved   PT LONG TERM GOAL #4   Title Pt will negotiate standard curb step with LRAD and mod I to indicate ability to traverse community obstacles. Target date: 10/25/14   Baseline Met 8/23.   Status Achieved   PT LONG TERM GOAL #5   Title Pt will increase Primary FOTO score from 42 to 52 to indicate improvement in patient-perceived functional status. Target date: 10/25/14    Status On-going               Plan - 10/23/14 1441    Clinical Impression Statement LTG addressing community mobility met during this session. Addressed postural alignment/awareness, due to noted increase in flexed posture with increased distance ambulated. Added pec major/minor stretches to HEP. Discussed discharge after next session with agreement from pt/daughter.   Pt will benefit from skilled therapeutic intervention in order to improve on the following deficits Decreased balance;Decreased strength;Impaired flexibility;Abnormal gait;Postural dysfunction;Decreased mobility;Decreased knowledge of use of DME;Decreased activity tolerance;Decreased coordination   Rehab Potential Good   Clinical Impairments Affecting Rehab Potential transportation limitations (daughter drives pt to appts and also assists with Hospice care for pt's wife)   PT Frequency 2x / week   PT Duration 8 weeks   PT Treatment/Interventions Vestibular;DME Instruction;Gait training;Stair training;Functional mobility training;Balance training;Therapeutic exercise;Therapeutic activities;Neuromuscular re-education;Patient/family education;Manual techniques;ADLs/Self Care Home Management   PT Next Visit Plan Add exercise for scapular rows then D/C (pt/daughter in agreement). Check final LTG's for fall prevention strategies and FOTO. Cancel remaining sessions. GCODES.   PT Home Exercise Plan OTAGO   Consulted and Agree with Plan of Care Patient;Family member/caregiver   Family Member Consulted daughter, Olegario Shearer        Problem List Patient Active Problem List   Diagnosis Date Noted  . Pericardial effusion 09/30/2012  . RBBB and first degree atrioventricular block 09/30/2012  . HTN (hypertension) 09/30/2012    Billie Ruddy, PT, DPT [facility] [address] [place], [place], [address] Phone: [phone]   Fax:  [phone] 10/23/2014, 2:47 PM

## 2014-10-26 ENCOUNTER — Encounter: Payer: Self-pay | Admitting: Physical Therapy

## 2014-10-26 ENCOUNTER — Ambulatory Visit: Payer: Medicare Other | Admitting: Physical Therapy

## 2014-10-26 DIAGNOSIS — M25659 Stiffness of unspecified hip, not elsewhere classified: Secondary | ICD-10-CM | POA: Diagnosis not present

## 2014-10-26 DIAGNOSIS — R2681 Unsteadiness on feet: Secondary | ICD-10-CM | POA: Diagnosis not present

## 2014-10-26 DIAGNOSIS — R269 Unspecified abnormalities of gait and mobility: Secondary | ICD-10-CM

## 2014-10-26 NOTE — Therapy (Signed)
[facility] [address] [place], [place], [address] Phone: [phone]   Fax:  [phone]  Physical Therapy Treatment  Patient Details  Name: Terry Gregory MRN: [id number] Date of Birth: [birth year] Referring Provider:  Burnard Bunting, MD  Encounter Date: 10/26/2014      PT End of Session - 10/26/14 1106    Visit Number 15   Number of Visits 17   Date for PT Re-Evaluation 10/29/14   Authorization Type Medicare - G Codes every 10 visits   PT Start Time 1103   PT Stop Time 1145   PT Time Calculation (min) 42 min   Equipment Utilized During Treatment Gait belt   Activity Tolerance Patient tolerated treatment well   Behavior During Therapy [facility] for tasks assessed/performed      Past Medical History  Diagnosis Date  . Pericardial effusion   . Diverticulosis   . BPH (benign prostatic hyperplasia)     Past Surgical History  Procedure Laterality Date  . Appendectomy  1960  . Inguinal hernia repair  1980's  . Cholecystectomy  2000  . Total hip arthroplasty  2003    right  . Cataract extraction  2009    both eyes  . Wrist surgery  2009    left    There were no vitals filed for this visit.  Visit Diagnosis:  Abnormality of gait  Unsteadiness  Stiffness of hip joint, unspecified laterality      Subjective Assessment - 10/26/14 1105    Subjective No new complaints. No falls or pain to report. HEP is going well.   Currently in Pain? No/denies            [facility] Adult PT Treatment/Exercise - 10/26/14 1115    Dynamic Gait Index   Level Surface Normal   Change in Gait Speed Normal   Gait with Horizontal Head Turns Normal   Gait with Vertical Head Turns Normal   Gait and Pivot Turn Normal   Step Over Obstacle Mild Impairment   Step Around Obstacles Normal   Steps Mild Impairment   Total Score 22     Exercises: Hook lying chest stretch with foam noodle along spine and arms out for stretch. Held  for 45 sec's x 2 reps.  Seated rows (scapular retraction) with green theraband, 5 sec hold x 10 reps.         PT Education - 10/26/14 1216    Education provided Yes   Education Details HEP: scapular retraction with green band. Fall prevention strategies.   Person(s) Educated Patient;Child(ren)  daughter   Methods Explanation;Demonstration;Verbal cues;Handout   Comprehension Verbalized understanding;Returned demonstration          PT Short Term Goals - 09/28/14 1238    PT SHORT TERM GOAL #1   Title Pt will perform HEP with mod I using paper handout to indicate safe daily compliance with home exercises. Target date: 09/27/14   Status Achieved   PT SHORT TERM GOAL #2   Title Pt will ambulate x150' over level, indoor surface with mod I using SPC with correct sequencing of cane with gait. Target date: 09/27/14   Status Achieved   PT SHORT TERM GOAL #3   Title Pt will increase DGI score from 15 to 18/24 to indicate improved gait stability in the presence of external demands. Target date: 09/27/14   Baseline 20/24 on 09/28/14   Status Achieved           PT Long Term Goals -  10/26/14 1212    PT LONG TERM GOAL #1   Title Pt will verbalize understanding of fall prevention strategies to decrease fall risk within home.  Target date: 10/25/14   Status Not Met   PT LONG TERM GOAL #2   Title Pt will increase DGI score from 15 to 20/24 to indicate decreased fall risk. Target date: 10/25/14   Baseline 9/13: 22/24 scored today   Status Achieved   PT LONG TERM GOAL #3   Title Pt will ambulate 500' over unlevel asphalt and grassy surfaces with LRAD and mod I with no overt LOB. Target date: 10/25/14   Status Achieved   PT LONG TERM GOAL #4   Title Pt will negotiate standard curb step with LRAD and mod I to indicate ability to traverse community obstacles. Target date: 10/25/14   Baseline Met 8/23.   Status Achieved   PT LONG TERM GOAL #5   Title Pt will increase Primary FOTO score from 42 to  52 to indicate improvement in patient-perceived functional status. Target date: 10/25/14   Baseline 10/26/14: scored 54 on survey today.   Status Achieved           Plan - 10/26/14 1107    Clinical Impression Statement Pt has met all LTG's and is ready for discharge. Reviewed stretch added last visit and educated pt on use of foam noodle vs towel for increased stability (as towel moves) with stretch. Added new scapular retraction with green theraband today without issues. Also provided fall prevention strategies as pt unable to state more than one, use a cane.                                  Pt will benefit from skilled therapeutic intervention in order to improve on the following deficits Decreased balance;Decreased strength;Impaired flexibility;Abnormal gait;Postural dysfunction;Decreased mobility;Decreased knowledge of use of DME;Decreased activity tolerance;Decreased coordination   Rehab Potential Good   Clinical Impairments Affecting Rehab Potential transportation limitations (daughter drives pt to appts and also assists with Hospice care for pt's wife)   PT Frequency 2x / week   PT Duration 8 weeks   PT Treatment/Interventions Vestibular;DME Instruction;Gait training;Stair training;Functional mobility training;Balance training;Therapeutic exercise;Therapeutic activities;Neuromuscular re-education;Patient/family education;Manual techniques;ADLs/Self Care Home Management   PT Next Visit Plan discharge per PT plan of care.   PT Home Exercise Plan OTAGO   Consulted and Agree with Plan of Care Patient;Family member/caregiver   Family Member Consulted daughter, Olegario Shearer        Problem List Patient Active Problem List   Diagnosis Date Noted  . Pericardial effusion 09/30/2012  . RBBB and first degree atrioventricular block 09/30/2012  . HTN (hypertension) 09/30/2012    Willow Ora 10/26/2014, 12:17 PM  Willow Ora, PTA, [facility] [address] [place], [place] [address] [phone] 10/26/2014, 12:17 PM     Addendum: G Codes Functional Assessment Tool: DGI score = 22/24 Functional Limitation:  Mobility: Walking and Moving Around Goal Status [id number]) - CI Discharge Status ([id number]) - CI  PHYSICAL THERAPY DISCHARGE SUMMARY  Visits from Start of Care: 15  Current functional level related to goals / functional outcomes: See goals and statuses above.   Remaining deficits: Pt continues to demonstrate progressively more flexed posture with increased distance ambulated. Have expanded on HEP to address said postural impairments. Also, pt unable to verbalize understanding of fall prevention strategies; therefore, PTA provided handout during today's  session to reiterate/promote pt carryover of strategies.   Education / Equipment: [place] HEP; HEP to address postural awareness, limited extensibility of B hip flexors, hamstrings, and plantarflexors, to address vestibular impairments, and for postural impairments.  Plan: Patient agrees to discharge.  Patient goals were met. Patient is being discharged due to meeting the stated rehab goals.  ?????

## 2014-10-26 NOTE — Patient Instructions (Signed)
Copyright  VHI. All rights reserved.  Shoulder Row: Sitting   Face anchor. Hold ends of band/tubing, pull elbows back, squeezing shoulder blades together. Stay tall/upright the whole time. Repeat _10_ times per set. Do _1_ sets per session. Do _1_ sessions per day. Anchor Height: Chest, loop band around door knob and shut door on it  http://tub.exer.us/285   Copyright  VHI. All rights reserved.  Fall Prevention and Home Safety Falls cause injuries and can affect all age groups. It is possible to use preventive measures to significantly decrease the likelihood of falls. There are many simple measures which can make your home safer and prevent falls. OUTDOORS  Repair cracks and edges of walkways and driveways.  Remove high doorway thresholds.  Trim shrubbery on the main path into your home.  Have good outside lighting.  Clear walkways of tools, rocks, debris, and clutter.  Check that handrails are not broken and are securely fastened. Both sides of steps should have handrails.  Have leaves, snow, and ice cleared regularly.  Use sand or salt on walkways during winter months.  In the garage, clean up grease or oil spills. BATHROOM  Install night lights.  Install grab bars by the toilet and in the tub and shower.  Use non-skid mats or decals in the tub or shower.  Place a plastic non-slip stool in the shower to sit on, if needed.  Keep floors dry and clean up all water on the floor immediately.  Remove soap buildup in the tub or shower on a regular basis.  Secure bath mats with non-slip, double-sided rug tape.  Remove throw rugs and tripping hazards from the floors. BEDROOMS  Install night lights.  Make sure a bedside light is easy to reach.  Do not use oversized bedding.  Keep a telephone by your bedside.  Have a firm chair with side arms to use for getting dressed.  Remove throw rugs and tripping hazards from the floor. KITCHEN  Keep handles on pots and  pans turned toward the center of the stove. Use back burners when possible.  Clean up spills quickly and allow time for drying.  Avoid walking on wet floors.  Avoid hot utensils and knives.  Position shelves so they are not too high or low.  Place commonly used objects within easy reach.  If necessary, use a sturdy step stool with a grab bar when reaching.  Keep electrical cables out of the way.  Do not use floor polish or wax that makes floors slippery. If you must use wax, use non-skid floor wax.  Remove throw rugs and tripping hazards from the floor. STAIRWAYS  Never leave objects on stairs.  Place handrails on both sides of stairways and use them. Fix any loose handrails. Make sure handrails on both sides of the stairways are as long as the stairs.  Check carpeting to make sure it is firmly attached along stairs. Make repairs to worn or loose carpet promptly.  Avoid placing throw rugs at the top or bottom of stairways, or properly secure the rug with carpet tape to prevent slippage. Get rid of throw rugs, if possible.  Have an electrician put in a light switch at the top and bottom of the stairs. OTHER FALL PREVENTION TIPS  Wear low-heel or rubber-soled shoes that are supportive and fit well. Wear closed toe shoes.  When using a stepladder, make sure it is fully opened and both spreaders are firmly locked. Do not climb a closed stepladder.  Add color  or contrast paint or tape to grab bars and handrails in your home. Place contrasting color strips on first and last steps.  Learn and use mobility aids as needed. Install an electrical emergency response system.  Turn on lights to avoid dark areas. Replace light bulbs that burn out immediately. Get light switches that glow.  Arrange furniture to create clear pathways. Keep furniture in the same place.  Firmly attach carpet with non-skid or double-sided tape.  Eliminate uneven floor surfaces.  Select a carpet pattern  that does not visually hide the edge of steps.  Be aware of all pets. OTHER HOME SAFETY TIPS  Set the water temperature for 120 F (48.8 C).  Keep emergency numbers on or near the telephone.  Keep smoke detectors on every level of the home and near sleeping areas. Document Released: 01/19/2002 Document Revised: 07/31/2011 Document Reviewed: 04/20/2011 [facility] Patient Information 2015 [place], [place]. This information is not intended to replace advice given to you by your health care provider. Make sure you discuss any questions you have with your health care provider.

## 2014-10-28 ENCOUNTER — Ambulatory Visit: Payer: Medicare Other | Admitting: Physical Therapy

## 2014-10-30 DIAGNOSIS — Z23 Encounter for immunization: Secondary | ICD-10-CM | POA: Diagnosis not present

## 2014-12-21 ENCOUNTER — Emergency Department (HOSPITAL_BASED_OUTPATIENT_CLINIC_OR_DEPARTMENT_OTHER)
Admission: EM | Admit: 2014-12-21 | Discharge: 2014-12-21 | Disposition: A | Discharge status: Home / Self Care | Payer: Medicare Other | Attending: Emergency Medicine | Admitting: Emergency Medicine

## 2014-12-21 ENCOUNTER — Emergency Department (HOSPITAL_BASED_OUTPATIENT_CLINIC_OR_DEPARTMENT_OTHER): Payer: Medicare Other

## 2014-12-21 ENCOUNTER — Encounter (HOSPITAL_BASED_OUTPATIENT_CLINIC_OR_DEPARTMENT_OTHER): Payer: Self-pay | Admitting: *Deleted

## 2014-12-21 DIAGNOSIS — S199XXA Unspecified injury of neck, initial encounter: Secondary | ICD-10-CM | POA: Diagnosis not present

## 2014-12-21 DIAGNOSIS — L089 Local infection of the skin and subcutaneous tissue, unspecified: Secondary | ICD-10-CM

## 2014-12-21 DIAGNOSIS — Z23 Encounter for immunization: Secondary | ICD-10-CM | POA: Diagnosis not present

## 2014-12-21 DIAGNOSIS — S3992XA Unspecified injury of lower back, initial encounter: Secondary | ICD-10-CM | POA: Insufficient documentation

## 2014-12-21 DIAGNOSIS — W01198A Fall on same level from slipping, tripping and stumbling with subsequent striking against other object, initial encounter: Secondary | ICD-10-CM | POA: Diagnosis not present

## 2014-12-21 DIAGNOSIS — Z880 Allergy status to penicillin: Secondary | ICD-10-CM | POA: Insufficient documentation

## 2014-12-21 DIAGNOSIS — Z87448 Personal history of other diseases of urinary system: Secondary | ICD-10-CM | POA: Diagnosis not present

## 2014-12-21 DIAGNOSIS — M25551 Pain in right hip: Secondary | ICD-10-CM | POA: Diagnosis not present

## 2014-12-21 DIAGNOSIS — Y998 Other external cause status: Secondary | ICD-10-CM | POA: Diagnosis not present

## 2014-12-21 DIAGNOSIS — S80811A Abrasion, right lower leg, initial encounter: Secondary | ICD-10-CM | POA: Diagnosis not present

## 2014-12-21 DIAGNOSIS — Z8719 Personal history of other diseases of the digestive system: Secondary | ICD-10-CM | POA: Insufficient documentation

## 2014-12-21 DIAGNOSIS — S0990XA Unspecified injury of head, initial encounter: Secondary | ICD-10-CM | POA: Insufficient documentation

## 2014-12-21 DIAGNOSIS — Z8679 Personal history of other diseases of the circulatory system: Secondary | ICD-10-CM | POA: Insufficient documentation

## 2014-12-21 DIAGNOSIS — Y9289 Other specified places as the place of occurrence of the external cause: Secondary | ICD-10-CM | POA: Insufficient documentation

## 2014-12-21 DIAGNOSIS — W108XXA Fall (on) (from) other stairs and steps, initial encounter: Secondary | ICD-10-CM

## 2014-12-21 DIAGNOSIS — Z79899 Other long term (current) drug therapy: Secondary | ICD-10-CM | POA: Diagnosis not present

## 2014-12-21 DIAGNOSIS — Y9389 Activity, other specified: Secondary | ICD-10-CM | POA: Insufficient documentation

## 2014-12-21 DIAGNOSIS — W19XXXA Unspecified fall, initial encounter: Secondary | ICD-10-CM

## 2014-12-21 DIAGNOSIS — S79911A Unspecified injury of right hip, initial encounter: Secondary | ICD-10-CM | POA: Diagnosis present

## 2014-12-21 MED ORDER — HYDROCODONE-ACETAMINOPHEN 5-325 MG PO TABS
1.0000 | ORAL_TABLET | Freq: Once | ORAL | Status: AC
Start: 2014-12-21 — End: 2014-12-21
  Administered 2014-12-21: 1 via ORAL
  Filled 2014-12-21: qty 1

## 2014-12-21 MED ORDER — TETANUS-DIPHTH-ACELL PERTUSSIS 5-2.5-18.5 LF-MCG/0.5 IM SUSP
0.5000 mL | Freq: Once | INTRAMUSCULAR | Status: AC
  Administered 2014-12-21: 0.5 mL via INTRAMUSCULAR
  Filled 2014-12-21: qty 0.5

## 2014-12-21 MED ORDER — HYDROCODONE-ACETAMINOPHEN 5-325 MG PO TABS: 1.0000 | ORAL_TABLET | Freq: Three times a day (TID) | ORAL | Status: DC | PRN

## 2014-12-21 NOTE — ED Provider Notes (Signed)
CSN: [id number]     Arrival date & time 12/21/14  1637 History   First MD Initiated Contact with Patient 12/21/14 1746     Chief Complaint  Patient presents with  . Fall     (Consider location/radiation/quality/duration/timing/severity/associated sxs/prior Treatment) Patient is a 81 y.o. male presenting with fall. The history is provided by the patient, medical records and a relative. No language interpreter was used.  Fall Associated symptoms include arthralgias ( Right hip). Pertinent negatives include no abdominal pain, chest pain, coughing, diaphoresis, fatigue, fever, headaches, nausea, rash or vomiting.     Terry Gregory is a 81 y.o. male  with a hx of cataracts, appendectomy, diverticulosis, BPH presents to the Emergency Department complaining of gradual, persistent, progressively worsening mid back pain onset approximately 3 hours prior to arrival after mechanical fall. Patient reports he stepped backwards off a step and lost his balance, falling and hitting his head on the ground. He reports that he "got my bell rung" but he denies syncope or loss of consciousness.  He reports he was able to get up with some assistance and ambulated without difficulty or assistance into the house. After sitting for a short while his right hip and back began to hurt.  Patient reports difficulty walking after this. While he makes symptoms worse and nothing makes them better. No treatment prior to arrival.  Patient denies vision changes, tinnitus, numbness, tingling, weakness.    Past Medical History  Diagnosis Date  . Pericardial effusion   . Diverticulosis   . BPH (benign prostatic hyperplasia)    Past Surgical History  Procedure Laterality Date  . Appendectomy  1960  . Inguinal hernia repair  1980's  . Cholecystectomy  2000  . Total hip arthroplasty  2003    right  . Cataract extraction  2009    both eyes  . Wrist surgery  2009    left   Family History  Problem Relation Age of Onset  .  Heart attack Father   . Stroke Mother   . Heart failure Brother   . Stroke Brother   . Diabetes Sister   . Cancer Sister    Social History  Substance Use Topics  . Smoking status: Never Smoker   . Smokeless tobacco: Never Used  . Alcohol Use: No    Review of Systems  Constitutional: Negative for fever, diaphoresis, appetite change, fatigue and unexpected weight change.  HENT: Negative for mouth sores.   Eyes: Negative for visual disturbance.  Respiratory: Negative for cough, chest tightness, shortness of breath and wheezing.   Cardiovascular: Negative for chest pain.  Gastrointestinal: Negative for nausea, vomiting, abdominal pain, diarrhea and constipation.  Endocrine: Negative for polydipsia, polyphagia and polyuria.  Genitourinary: Negative for dysuria, urgency, frequency and hematuria.  Musculoskeletal: Positive for back pain and arthralgias ( Right hip). Negative for neck stiffness. Gait problem:  2/2 pain.  Skin: Negative for rash.  Allergic/Immunologic: Negative for immunocompromised state.  Neurological: Negative for syncope, light-headedness and headaches.  Hematological: Does not bruise/bleed easily.  Psychiatric/Behavioral: Negative for sleep disturbance. The patient is not nervous/anxious.       Allergies  Erythromycin and Penicillins  Home Medications   Prior to Admission medications   Medication Sig Start Date End Date Taking? Authorizing Provider  acetic acid-aluminum acetate (DOMEBORO OTIC) 2 % otic solution Place 5 drops into both ears.    Historical Provider, MD  finasteride (PROSCAR) 5 MG tablet Take 5 mg by mouth daily.    Historical Provider,  MD  HYDROcodone-acetaminophen (NORCO/VICODIN) 5-325 MG tablet Take 1 tablet by mouth every 8 (eight) hours as needed for moderate pain or severe pain. 12/21/14   Jaison Petraglia, PA-C  lisinopril (PRINIVIL,ZESTRIL) 10 MG tablet Take 1 tablet (10 mg total) by mouth daily. 10/01/14   Mihai Croitoru, MD  metoprolol  succinate (TOPROL-XL) 50 MG 24 hr tablet Take 50 mg by mouth daily. Take with or immediately following a meal.    Historical Provider, MD  polyethylene glycol (MIRALAX / GLYCOLAX) packet Take 17 g by mouth daily.    Historical Provider, MD   BP 140/89 mmHg  Pulse 82  Temp(Src) 97.9 F (36.6 C) (Oral)  Resp 18  Ht 6\' 3"  (1.905 m)  Wt 205 lb (92.987 kg)  BMI 25.62 kg/m2  SpO2 98% Physical Exam  Constitutional: He is oriented to person, place, and time. He appears well-developed and well-nourished. No distress.  Awake, alert, nontoxic appearance  HENT:  Head: Normocephalic and atraumatic.  Mouth/Throat: Oropharynx is clear and moist. No oropharyngeal exudate.  Eyes: Conjunctivae and EOM are normal. Pupils are equal, round, and reactive to light. No scleral icterus.  No horizontal, vertical or rotational nystagmus  Neck: Normal range of motion. Neck supple.  Full active and passive ROM without pain No midline or paraspinal tenderness No nuchal rigidity or meningeal signs  Cardiovascular: Normal rate, regular rhythm, normal heart sounds and intact distal pulses.   Pulmonary/Chest: Effort normal and breath sounds normal. No respiratory distress. He has no wheezes. He has no rales.  Equal chest expansion  Abdominal: Soft. Bowel sounds are normal. He exhibits no mass. There is no tenderness. There is no rebound and no guarding.  Musculoskeletal: Normal range of motion. He exhibits no edema.  Lymphadenopathy:    He has no cervical adenopathy.  Neurological: He is alert and oriented to person, place, and time. He has normal reflexes. No cranial nerve deficit. He exhibits normal muscle tone. Coordination normal.  Mental Status:  Alert, oriented, thought content appropriate. Speech fluent without evidence of aphasia. Able to follow 2 step commands without difficulty.  Cranial Nerves:  II:  Peripheral visual fields grossly normal, pupils equal, round, reactive to light III,IV, VI: ptosis not  present, extra-ocular motions intact bilaterally  V,VII: smile symmetric, facial light touch sensation equal VIII: hearing grossly normal bilaterally  IX,X: midline uvula rise  XI: bilateral shoulder shrug equal and strong XII: midline tongue extension  Motor:  5/5 in upper and lower extremities bilaterally including strong and equal grip strength and dorsiflexion/plantar flexion Sensory: Pinprick and light touch normal in all extremities.  Deep Tendon Reflexes: 2+ and symmetric  Cerebellar: normal finger-to-nose with bilateral upper extremities Gait: gait testing deferred CV: distal pulses palpable throughout   Skin: Skin is warm and dry. No rash noted. He is not diaphoretic.  Small abrasions to the posterior right knee and right calf  Psychiatric: He has a normal mood and affect. His behavior is normal. Judgment and thought content normal.  Nursing note and vitals reviewed.   ED Course  Procedures (including critical care time) Labs Review Labs Reviewed - No data to display  Imaging Review Dg Thoracic Spine 2 View  12/21/2014  CLINICAL DATA:  [place] tonight at home an injured back and right hip. EXAM: DG HIP (WITH OR WITHOUT PELVIS) 2-3V RIGHT; THORACIC SPINE 2 VIEWS ; LUMBAR SPINE - COMPLETE 4+ VIEW COMPARISON:  CT scan 06/23/2014 FINDINGS: Right hip: The right hip prosthesis is intact. No periprosthetic fracture. Stable areas  of heterotopic ossification. The left hip is intact. The pubic symphysis and SI joints are intact. Degenerative changes are noted. No definite pelvic fractures. Thoracic spine: Normal alignment of the thoracic vertebral bodies. No acute fracture. Mild degenerative changes for age. No abnormal paraspinal soft tissue swelling. The visualized posterior ribs are intact. Lumbar spine: Normal alignment and no acute bony findings. Moderate stable degenerative changes. IMPRESSION: No acute bony findings. Electronically Signed   By: Marijo Sanes M.D.   On: 12/21/2014 19:19    Dg Lumbar Spine Complete  12/21/2014  CLINICAL DATA:  [place] tonight at home an injured back and right hip. EXAM: DG HIP (WITH OR WITHOUT PELVIS) 2-3V RIGHT; THORACIC SPINE 2 VIEWS ; LUMBAR SPINE - COMPLETE 4+ VIEW COMPARISON:  CT scan 06/23/2014 FINDINGS: Right hip: The right hip prosthesis is intact. No periprosthetic fracture. Stable areas of heterotopic ossification. The left hip is intact. The pubic symphysis and SI joints are intact. Degenerative changes are noted. No definite pelvic fractures. Thoracic spine: Normal alignment of the thoracic vertebral bodies. No acute fracture. Mild degenerative changes for age. No abnormal paraspinal soft tissue swelling. The visualized posterior ribs are intact. Lumbar spine: Normal alignment and no acute bony findings. Moderate stable degenerative changes. IMPRESSION: No acute bony findings. Electronically Signed   By: Marijo Sanes M.D.   On: 12/21/2014 19:19   Ct Head Wo Contrast  12/21/2014  CLINICAL DATA:  [place] today and hit the back of his head. EXAM: CT HEAD WITHOUT CONTRAST CT CERVICAL SPINE WITHOUT CONTRAST TECHNIQUE: Multidetector CT imaging of the head and cervical spine was performed following the standard protocol without intravenous contrast. Multiplanar CT image reconstructions of the cervical spine were also generated. COMPARISON:  10/27/2007 FINDINGS: CT HEAD FINDINGS Stable age related cerebral atrophy, ventriculomegaly and periventricular white matter disease. No extra-axial fluid collections are identified. No CT findings for acute hemispheric infarction or intracranial hemorrhage. 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 No mass lesions. The brainstem and cerebellum are normal. No acute skull fracture is identified. Scattered ethmoid sinus disease. The mastoid air cells and middle ear cavities are clear. The globes are intact. CT CERVICAL SPINE FINDINGS Mild degenerative cervical spondylosis for age. Mild  multilevel disc disease and moderate facet disease but no acute fracture, abnormal prevertebral soft tissue swelling or spinal canal compromise. The skullbase C1 and C1-2 articulations are maintained. The dens is intact. The lung apices are grossly clear. IMPRESSION: No acute intracranial findings or mass lesion and no acute skull fracture. No acute cervical spine fracture. Electronically Signed   By: Marijo Sanes M.D.   On: 12/21/2014 19:51   Ct Cervical Spine Wo Contrast  12/21/2014  CLINICAL DATA:  [place] today and hit the back of his head. EXAM: CT HEAD WITHOUT CONTRAST CT CERVICAL SPINE WITHOUT CONTRAST TECHNIQUE: Multidetector CT imaging of the head and cervical spine was performed following the standard protocol without intravenous contrast. Multiplanar CT image reconstructions of the cervical spine were also generated. COMPARISON:  10/27/2007 FINDINGS: CT HEAD FINDINGS Stable age related cerebral atrophy, ventriculomegaly and periventricular white matter disease. No extra-axial fluid collections are identified. No CT findings for acute hemispheric infarction or intracranial hemorrhage. 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 No mass lesions. The brainstem and cerebellum are normal. No acute skull fracture is identified. Scattered ethmoid sinus disease. The mastoid air cells and middle ear cavities are clear. The globes are intact. CT CERVICAL SPINE  FINDINGS Mild degenerative cervical spondylosis for age. Mild multilevel disc disease and moderate facet disease but no acute fracture, abnormal prevertebral soft tissue swelling or spinal canal compromise. The skullbase C1 and C1-2 articulations are maintained. The dens is intact. The lung apices are grossly clear. IMPRESSION: No acute intracranial findings or mass lesion and no acute skull fracture. No acute cervical spine fracture. Electronically Signed   By: Marijo Sanes M.D.   On: 12/21/2014 19:51   Dg Hip  Unilat With Pelvis 2-3 Views Right  12/21/2014  CLINICAL DATA:  [place] tonight at home an injured back and right hip. EXAM: DG HIP (WITH OR WITHOUT PELVIS) 2-3V RIGHT; THORACIC SPINE 2 VIEWS ; LUMBAR SPINE - COMPLETE 4+ VIEW COMPARISON:  CT scan 06/23/2014 FINDINGS: Right hip: The right hip prosthesis is intact. No periprosthetic fracture. Stable areas of heterotopic ossification. The left hip is intact. The pubic symphysis and SI joints are intact. Degenerative changes are noted. No definite pelvic fractures. Thoracic spine: Normal alignment of the thoracic vertebral bodies. No acute fracture. Mild degenerative changes for age. No abnormal paraspinal soft tissue swelling. The visualized posterior ribs are intact. Lumbar spine: Normal alignment and no acute bony findings. Moderate stable degenerative changes. IMPRESSION: No acute bony findings. Electronically Signed   By: Marijo Sanes M.D.   On: 12/21/2014 19:19   I have personally reviewed and evaluated these images and lab results as part of my medical decision-making.   EKG Interpretation None      MDM   Final diagnoses:  Fall (on) (from) other stairs and steps, initial encounter  Right hip pain  Abrasion, leg w/ infection, right, initial encounter   Terry Gregory presents after mechanical fall. No syncope or loss of consciousness. Patient does not take blood thinners. We'll obtain imaging and pain control.  8:37 PM Imaging is without acute abnormality. He ambulates particularly well here in the emergency department without assistance but with using a walker.  He reports he normally uses a cane at home.  He reports his pain is under control.  Patient will be discharged home with a short course of vitamin as needed for severe pain. Discussed with patient and family that this may increase risk for falls, drowsiness or feelings of being off balance. Recommend extreme caution.  Patient and family state understanding.  BP 140/89 mmHg  Pulse 82   Temp(Src) 97.9 F (36.6 C) (Oral)  Resp 18  Ht 6\' 3"  (1.905 m)  Wt 205 lb (92.987 kg)  BMI 25.62 kg/m2  SpO2 98%  The patient was discussed with and seen by Dr. Alfonse Spruce who agrees with the treatment plan.   Terry Soho Saul Dorsi, PA-C 12/21/14 2039  Harvel Quale, MD 12/22/14 [phone]

## 2014-12-21 NOTE — ED Notes (Signed)
Ambulating patient with walker. States that he uses cane at home but it's in the car.

## 2014-12-21 NOTE — Discharge Instructions (Signed)
1. Medications: alternate naprosyn and tylenol for pain control, Vicodin only for severe pain, usual home medications 2. Treatment: rest, ice, elevate and use brace, drink plenty of fluids, gentle stretching.  Please note that Vicodin may make you feel off balance and may increase her risk for falls. Please see's carefully and when there is someone to assist you 3. Follow Up: Please followup with your PCP in 1 week if no improvement for discussion of your diagnoses and further evaluation after today's visit; if you do not have a primary care doctor use the resource guide provided to find one; Please return to the ER for worsening symptoms or other concerns

## 2014-12-21 NOTE — ED Notes (Signed)
Fall. Hit his head and right hip. Hx of hip replacement of same hip.

## 2014-12-21 NOTE — ED Notes (Signed)
MD at bedside. 

## 2015-01-04 DIAGNOSIS — R310 Gross hematuria: Secondary | ICD-10-CM | POA: Diagnosis not present

## 2015-02-02 DIAGNOSIS — R2681 Unsteadiness on feet: Secondary | ICD-10-CM | POA: Diagnosis not present

## 2015-02-02 DIAGNOSIS — N401 Enlarged prostate with lower urinary tract symptoms: Secondary | ICD-10-CM | POA: Diagnosis not present

## 2015-02-02 DIAGNOSIS — I313 Pericardial effusion (noninflammatory): Secondary | ICD-10-CM | POA: Diagnosis not present

## 2015-02-02 DIAGNOSIS — R7301 Impaired fasting glucose: Secondary | ICD-10-CM | POA: Diagnosis not present

## 2015-02-02 DIAGNOSIS — I872 Venous insufficiency (chronic) (peripheral): Secondary | ICD-10-CM | POA: Diagnosis not present

## 2015-02-02 DIAGNOSIS — M1990 Unspecified osteoarthritis, unspecified site: Secondary | ICD-10-CM | POA: Diagnosis not present

## 2015-02-02 DIAGNOSIS — N182 Chronic kidney disease, stage 2 (mild): Secondary | ICD-10-CM | POA: Diagnosis not present

## 2015-02-02 DIAGNOSIS — I129 Hypertensive chronic kidney disease with stage 1 through stage 4 chronic kidney disease, or unspecified chronic kidney disease: Secondary | ICD-10-CM | POA: Diagnosis not present

## 2015-02-02 DIAGNOSIS — I10 Essential (primary) hypertension: Secondary | ICD-10-CM | POA: Diagnosis not present

## 2015-04-26 ENCOUNTER — Other Ambulatory Visit: Payer: Self-pay | Admitting: Cardiovascular Disease

## 2015-04-26 NOTE — Telephone Encounter (Signed)
REFILL 

## 2015-06-21 DIAGNOSIS — L738 Other specified follicular disorders: Secondary | ICD-10-CM | POA: Diagnosis not present

## 2015-06-21 DIAGNOSIS — C44719 Basal cell carcinoma of skin of left lower limb, including hip: Secondary | ICD-10-CM | POA: Diagnosis not present

## 2015-06-21 DIAGNOSIS — D2271 Melanocytic nevi of right lower limb, including hip: Secondary | ICD-10-CM | POA: Diagnosis not present

## 2015-06-21 DIAGNOSIS — L821 Other seborrheic keratosis: Secondary | ICD-10-CM | POA: Diagnosis not present

## 2015-06-21 DIAGNOSIS — D225 Melanocytic nevi of trunk: Secondary | ICD-10-CM | POA: Diagnosis not present

## 2015-06-21 DIAGNOSIS — Z85828 Personal history of other malignant neoplasm of skin: Secondary | ICD-10-CM | POA: Diagnosis not present

## 2015-07-31 ENCOUNTER — Encounter (HOSPITAL_COMMUNITY): Payer: Self-pay | Admitting: *Deleted

## 2015-07-31 ENCOUNTER — Emergency Department (HOSPITAL_COMMUNITY): Payer: Medicare Other

## 2015-07-31 ENCOUNTER — Emergency Department (HOSPITAL_COMMUNITY)
Admission: EM | Admit: 2015-07-31 | Discharge: 2015-07-31 | Disposition: A | Discharge status: Home / Self Care | Payer: Medicare Other | Attending: Emergency Medicine | Admitting: Emergency Medicine

## 2015-07-31 ENCOUNTER — Other Ambulatory Visit: Payer: Self-pay

## 2015-07-31 DIAGNOSIS — Z7982 Long term (current) use of aspirin: Secondary | ICD-10-CM | POA: Diagnosis not present

## 2015-07-31 DIAGNOSIS — R079 Chest pain, unspecified: Secondary | ICD-10-CM

## 2015-07-31 DIAGNOSIS — R072 Precordial pain: Secondary | ICD-10-CM | POA: Diagnosis present

## 2015-07-31 DIAGNOSIS — R0789 Other chest pain: Secondary | ICD-10-CM | POA: Diagnosis not present

## 2015-07-31 DIAGNOSIS — R42 Dizziness and giddiness: Secondary | ICD-10-CM | POA: Insufficient documentation

## 2015-07-31 DIAGNOSIS — R404 Transient alteration of awareness: Secondary | ICD-10-CM | POA: Diagnosis not present

## 2015-07-31 DIAGNOSIS — Z79899 Other long term (current) drug therapy: Secondary | ICD-10-CM | POA: Diagnosis not present

## 2015-07-31 LAB — BASIC METABOLIC PANEL
Anion gap: 6 (ref 5–15)
BUN: 22 mg/dL — ABNORMAL HIGH (ref 6–20)
CO2: 26 mmol/L (ref 22–32)
Calcium: 9 mg/dL (ref 8.9–10.3)
Chloride: 105 mmol/L (ref 101–111)
Creatinine, Ser: 0.94 mg/dL (ref 0.61–1.24)
GFR calc Af Amer: >60 mL/min (ref >60)
GFR calc non Af Amer: >60 mL/min (ref >60)
Glucose, Bld: 107 mg/dL — ABNORMAL HIGH (ref 65–99)
Potassium: 3.9 mmol/L (ref 3.5–5.1)
Sodium: 137 mmol/L (ref 135–145)

## 2015-07-31 LAB — URINALYSIS, ROUTINE W REFLEX MICROSCOPIC
Bilirubin Urine: NEGATIVE
Glucose, UA: NEGATIVE mg/dL
Hgb urine dipstick: NEGATIVE
Ketones, ur: NEGATIVE mg/dL
Leukocytes, UA: NEGATIVE
Nitrite: NEGATIVE
Protein, ur: NEGATIVE mg/dL
Specific Gravity, Urine: 1.01 (ref 1.005–1.030)
pH: 7.5 (ref 5.0–8.0)

## 2015-07-31 LAB — I-STAT TROPONIN, ED
Troponin i, poc: 0 ng/mL (ref 0.00–0.08)
Troponin i, poc: 0 ng/mL (ref 0.00–0.08)

## 2015-07-31 LAB — CBC
HCT: 36.7 % — ABNORMAL LOW (ref 39.0–52.0)
Hemoglobin: 12.2 g/dL — ABNORMAL LOW (ref 13.0–17.0)
MCH: 33.1 pg (ref 26.0–34.0)
MCHC: 33.2 g/dL (ref 30.0–36.0)
MCV: 99.5 fL (ref 78.0–100.0)
Platelets: 181 10*3/uL (ref 150–400)
RBC: 3.69 MIL/uL — ABNORMAL LOW (ref 4.22–5.81)
RDW: 13.4 % (ref 11.5–15.5)
WBC: 5.7 10*3/uL (ref 4.0–10.5)

## 2015-07-31 NOTE — ED Notes (Signed)
Pt arrives via EMS from home. From report, the pt woke up this morning with a tightness in his chest and experienced some dizzines. VSS en route. Pt has BBB on ekg, cbg 96. Negative stroke screen. IV established enroute.

## 2015-07-31 NOTE — ED Notes (Signed)
Pt reports he has had on and off dizziness for about 1 week. Pt says when he woke up this morning he just did not feel right in his chest when he took a deep breath and had dizziness when he sat up. Dizziness subsided at this time.

## 2015-07-31 NOTE — ED Notes (Signed)
Patient transported to X-ray 

## 2015-07-31 NOTE — ED Notes (Signed)
Bed: [id number] Expected date:  Expected time:  Means of arrival:  Comments: EMS 82yo M dizziness / chest pain

## 2015-07-31 NOTE — ED Provider Notes (Signed)
CSN: [id number]     Arrival date & time 07/31/15  0526 History   First MD Initiated Contact with Patient 07/31/15 [phone]     Chief Complaint  Patient presents with  . Chest Pain  . Dizziness   HPI  Terry Gregory is a 82 y.o. male PMH significant for BPH presenting with a 1 week history of dizziness and one episode of chest pain today. He has had 2 episodes of dizziness over the last week. He describes them as vertiginous, lasting a few minutes and then resolving spontaneously. He states he had midsternal chest pressure this morning. He states it lasted approximately a few minutes, was nonexertional. He denies fevers, chills, SOB, abdominal pain, nausea, vomiting, change in bowel or bladder habits, hematochezia.   Past Medical History  Diagnosis Date  . Pericardial effusion   . Diverticulosis   . BPH (benign prostatic hyperplasia)    Past Surgical History  Procedure Laterality Date  . Appendectomy  1960  . Inguinal hernia repair  1980's  . Cholecystectomy  2000  . Total hip arthroplasty  2003    right  . Cataract extraction  2009    both eyes  . Wrist surgery  2009    left   Family History  Problem Relation Age of Onset  . Heart attack Father   . Stroke Mother   . Heart failure Brother   . Stroke Brother   . Diabetes Sister   . Cancer Sister    Social History  Substance Use Topics  . Smoking status: Never Smoker   . Smokeless tobacco: Never Used  . Alcohol Use: No    Review of Systems  Ten systems are reviewed and are negative for acute change except as noted in the HPI  Allergies  Erythromycin and Penicillins  Home Medications   Prior to Admission medications   Medication Sig Start Date End Date Taking? Authorizing Provider  acetic acid-aluminum acetate (DOMEBORO OTIC) 2 % otic solution Place 5 drops into both ears.    Historical Provider, MD  finasteride (PROSCAR) 5 MG tablet Take 5 mg by mouth daily.    Historical Provider, MD  HYDROcodone-acetaminophen  (NORCO/VICODIN) 5-325 MG tablet Take 1 tablet by mouth every 8 (eight) hours as needed for moderate pain or severe pain. 12/21/14   Hannah Muthersbaugh, PA-C  lisinopril (PRINIVIL,ZESTRIL) 10 MG tablet TAKE 1 TABLET EACH DAY. 04/26/15   Mihai Croitoru, MD  metoprolol succinate (TOPROL-XL) 50 MG 24 hr tablet Take 50 mg by mouth daily. Take with or immediately following a meal.    Historical Provider, MD  polyethylene glycol (MIRALAX / GLYCOLAX) packet Take 17 g by mouth daily.    Historical Provider, MD   BP 132/74 mmHg  Pulse 66  Temp(Src) 97.9 F (36.6 C)  Resp 13  Ht 6\' 3"  (1.905 m)  Wt 92.987 kg  BMI 25.62 kg/m2  SpO2 98% Physical Exam  Constitutional: He is oriented to person, place, and time. He appears well-developed and well-nourished. No distress.  HENT:  Head: Normocephalic and atraumatic.  Right Ear: External ear normal.  Left Ear: External ear normal.  Mouth/Throat: Oropharynx is clear and moist. No oropharyngeal exudate.  Clear effusions bilateral middle ears.  Eyes: Conjunctivae are normal. Pupils are equal, round, and reactive to light. Right eye exhibits no discharge. Left eye exhibits no discharge. No scleral icterus.  Neck: No tracheal deviation present.  Cardiovascular: Normal rate, regular rhythm, normal heart sounds and intact distal pulses.  Exam reveals no  gallop and no friction rub.   No murmur heard. Pulmonary/Chest: Effort normal and breath sounds normal. No respiratory distress. He has no wheezes. He has no rales. He exhibits no tenderness.  Abdominal: Soft. Bowel sounds are normal. He exhibits no distension and no mass. There is no tenderness. There is no rebound and no guarding.  Musculoskeletal: Normal range of motion. He exhibits no edema.  Strength 5 out of 5 throughout.  Lymphadenopathy:    He has no cervical adenopathy.  Neurological: He is alert and oriented to person, place, and time. Coordination normal.  Cranial nerves II through XII grossly intact.  Normal rapid alternating movements, pronator drift, finger to nose.  Skin: Skin is warm and dry. No rash noted. He is not diaphoretic. No erythema.  Psychiatric: He has a normal mood and affect. His behavior is normal.  Nursing note and vitals reviewed.   ED Course  Procedures  Labs Review Labs Reviewed  BASIC METABOLIC PANEL - Abnormal; Notable for the following:    Glucose, Bld 107 (*)    BUN 22 (*)    All other components within normal limits  CBC - Abnormal; Notable for the following:    RBC 3.69 (*)    Hemoglobin 12.2 (*)    HCT 36.7 (*)    All other components within normal limits  Randolm Idol, ED   Imaging Review Dg Chest 2 View  07/31/2015  CLINICAL DATA:  Dizziness for about 1 week. Dizziness on deep breathing. Chest pain. Nonsmoker. EXAM: CHEST  2 VIEW COMPARISON:  10/27/2007 FINDINGS: Mild hyperinflation. Normal heart size and pulmonary vascularity. No focal airspace disease or consolidation in the lungs. No blunting of costophrenic angles. No pneumothorax. Mediastinal contours appear intact. Degenerative changes in the spine. IMPRESSION: No active cardiopulmonary disease. Electronically Signed   By: Lucienne Capers M.D.   On: 07/31/2015 06:12   I have personally reviewed and evaluated these images and lab results as part of my medical decision-making.   EKG Interpretation None      MDM   Final diagnoses:  Dizziness  Chest pain, unspecified chest pain type   Patient is to be discharged with recommendation to follow up with PCP in regards to today's hospital visit. Dizziness less likely central cause/CVA. No slurred speech, localized weakness, facial droop. Chest pain is not likely of cardiac or pulmonary etiology d/t presentation, VSS, no tracheal deviation, no JVD or new murmur, RRR, breath sounds equal bilaterally, EKG without acute abnormalities, negative troponin x 2, and negative CXR. Pt has been advised to return to the ED if CP becomes exertional,  associated with diaphoresis or nausea, radiates to left jaw/arm, worsens or becomes concerning in any way. Pt appears reliable for follow up and is agreeable to discharge.   Case has been discussed with Dr. Stark Jock who agrees with the above plan to discharge.   Culbertson Lions, PA-C 07/31/15 [place], MD 08/01/15 [phone]

## 2015-07-31 NOTE — ED Notes (Signed)
PA at bedside.

## 2015-07-31 NOTE — Discharge Instructions (Signed)
Terry Gregory,  Nice meeting you! Please follow-up with your primary care provider. Return to the emergency department if you develop increased chest pressure, shortness of breath, headaches, dizziness, new/worsening symptoms. Feel better soon!  S. Wendie Simmer, PA-C Benign Positional Vertigo Vertigo is the feeling that you or your surroundings are moving when they are not. Benign positional vertigo is the most common form of vertigo. The cause of this condition is not serious (is benign). This condition is triggered by certain movements and positions (is positional). This condition can be dangerous if it occurs while you are doing something that could endanger you or others, such as driving.  CAUSES In many cases, the cause of this condition is not known. It may be caused by a disturbance in an area of the inner ear that helps your brain to sense movement and balance. This disturbance can be caused by a viral infection (labyrinthitis), head injury, or repetitive motion. RISK FACTORS This condition is more likely to develop in:  Women.  People who are 41 years of age or older. SYMPTOMS Symptoms of this condition usually happen when you move your head or your eyes in different directions. Symptoms may start suddenly, and they usually last for less than a minute. Symptoms may include:  Loss of balance and falling.  Feeling like you are spinning or moving.  Feeling like your surroundings are spinning or moving.  Nausea and vomiting.  Blurred vision.  Dizziness.  Involuntary eye movement (nystagmus). Symptoms can be mild and cause only slight annoyance, or they can be severe and interfere with daily life. Episodes of benign positional vertigo may return (recur) over time, and they may be triggered by certain movements. Symptoms may improve over time. DIAGNOSIS This condition is usually diagnosed by medical history and a physical exam of the head, neck, and ears. You may be referred  to a health care provider who specializes in ear, nose, and throat (ENT) problems (otolaryngologist) or a provider who specializes in disorders of the nervous system (neurologist). You may have additional testing, including:  MRI.  A CT scan.  Eye movement tests. Your health care provider may ask you to change positions quickly while he or she watches you for symptoms of benign positional vertigo, such as nystagmus. Eye movement may be tested with an electronystagmogram (ENG), caloric stimulation, the Dix-Hallpike test, or the roll test.  An electroencephalogram (EEG). This records electrical activity in your brain.  Hearing tests. TREATMENT Usually, your health care provider will treat this by moving your head in specific positions to adjust your inner ear back to normal. Surgery may be needed in severe cases, but this is rare. In some cases, benign positional vertigo may resolve on its own in 2-4 weeks. HOME CARE INSTRUCTIONS Safety  Move slowly.Avoid sudden body or head movements.  Avoid driving.  Avoid operating heavy machinery.  Avoid doing any tasks that would be dangerous to you or others if a vertigo episode would occur.  If you have trouble walking or keeping your balance, try using a cane for stability. If you feel dizzy or unstable, sit down right away.  Return to your normal activities as told by your health care provider. Ask your health care provider what activities are safe for you. General Instructions  Take over-the-counter and prescription medicines only as told by your health care provider.  Avoid certain positions or movements as told by your health care provider.  Drink enough fluid to keep your urine clear or pale  yellow.  Keep all follow-up visits as told by your health care provider. This is important. SEEK MEDICAL CARE IF:  You have a fever.  Your condition gets worse or you develop new symptoms.  Your family or friends notice any behavioral  changes.  Your nausea or vomiting gets worse.  You have numbness or a "pins and needles" sensation. SEEK IMMEDIATE MEDICAL CARE IF:  You have difficulty speaking or moving.  You are always dizzy.  You faint.  You develop severe headaches.  You have weakness in your legs or arms.  You have changes in your hearing or vision.  You develop a stiff neck.  You develop sensitivity to light.   This information is not intended to replace advice given to you by your health care provider. Make sure you discuss any questions you have with your health care provider.   Document Released: 11/06/2005 Document Revised: 10/20/2014 Document Reviewed: 05/24/2014 Elsevier Interactive Patient Education [employer].

## 2015-08-01 DIAGNOSIS — I10 Essential (primary) hypertension: Secondary | ICD-10-CM | POA: Diagnosis not present

## 2015-08-01 DIAGNOSIS — R7301 Impaired fasting glucose: Secondary | ICD-10-CM | POA: Diagnosis not present

## 2015-08-01 DIAGNOSIS — Z125 Encounter for screening for malignant neoplasm of prostate: Secondary | ICD-10-CM | POA: Diagnosis not present

## 2015-08-08 DIAGNOSIS — R2681 Unsteadiness on feet: Secondary | ICD-10-CM | POA: Diagnosis not present

## 2015-08-08 DIAGNOSIS — I129 Hypertensive chronic kidney disease with stage 1 through stage 4 chronic kidney disease, or unspecified chronic kidney disease: Secondary | ICD-10-CM | POA: Diagnosis not present

## 2015-08-08 DIAGNOSIS — I313 Pericardial effusion (noninflammatory): Secondary | ICD-10-CM | POA: Diagnosis not present

## 2015-08-08 DIAGNOSIS — Z6826 Body mass index (BMI) 26.0-26.9, adult: Secondary | ICD-10-CM | POA: Diagnosis not present

## 2015-08-08 DIAGNOSIS — Z0000 Encounter for general adult medical examination without abnormal findings: Secondary | ICD-10-CM | POA: Diagnosis not present

## 2015-08-08 DIAGNOSIS — R7301 Impaired fasting glucose: Secondary | ICD-10-CM | POA: Diagnosis not present

## 2015-08-08 DIAGNOSIS — Z23 Encounter for immunization: Secondary | ICD-10-CM | POA: Diagnosis not present

## 2015-08-08 DIAGNOSIS — N401 Enlarged prostate with lower urinary tract symptoms: Secondary | ICD-10-CM | POA: Diagnosis not present

## 2015-08-08 DIAGNOSIS — N182 Chronic kidney disease, stage 2 (mild): Secondary | ICD-10-CM | POA: Diagnosis not present

## 2015-08-08 DIAGNOSIS — R609 Edema, unspecified: Secondary | ICD-10-CM | POA: Diagnosis not present

## 2015-08-08 DIAGNOSIS — Z1389 Encounter for screening for other disorder: Secondary | ICD-10-CM | POA: Diagnosis not present

## 2015-08-12 DIAGNOSIS — Z1212 Encounter for screening for malignant neoplasm of rectum: Secondary | ICD-10-CM | POA: Diagnosis not present

## 2015-09-08 DIAGNOSIS — Z0100 Encounter for examination of eyes and vision without abnormal findings: Secondary | ICD-10-CM | POA: Diagnosis not present

## 2015-09-08 DIAGNOSIS — Z961 Presence of intraocular lens: Secondary | ICD-10-CM | POA: Diagnosis not present

## 2015-10-06 ENCOUNTER — Encounter: Payer: Self-pay | Admitting: Cardiovascular Disease

## 2015-10-06 ENCOUNTER — Ambulatory Visit (INDEPENDENT_AMBULATORY_CARE_PROVIDER_SITE_OTHER): Payer: Medicare Other | Admitting: Cardiovascular Disease

## 2015-10-06 VITALS — BP 150/76 | HR 67 | Ht 75.0 in | Wt 210.0 lb

## 2015-10-06 DIAGNOSIS — I3139 Other pericardial effusion (noninflammatory): Secondary | ICD-10-CM

## 2015-10-06 DIAGNOSIS — I483 Typical atrial flutter: Secondary | ICD-10-CM

## 2015-10-06 DIAGNOSIS — I4510 Unspecified right bundle-branch block: Secondary | ICD-10-CM | POA: Diagnosis not present

## 2015-10-06 DIAGNOSIS — I319 Disease of pericardium, unspecified: Secondary | ICD-10-CM | POA: Diagnosis not present

## 2015-10-06 DIAGNOSIS — I10 Essential (primary) hypertension: Secondary | ICD-10-CM | POA: Diagnosis not present

## 2015-10-06 DIAGNOSIS — I313 Pericardial effusion (noninflammatory): Secondary | ICD-10-CM

## 2015-10-06 NOTE — Progress Notes (Signed)
Cardiology Office Note    Date:  10/06/2015   ID:  Terry Gregory, DOB July 26, 1921, MRN [id number]  PCP:  Geoffery Lyons, MD  Cardiologist:   Sanda Klein, MD   Chief Complaint  Patient presents with  . Follow-up    History of Present Illness:  Terry Gregory is a 82 y.o. male returning in follow-up for idiopathic pericardial effusion, hypertension and cardiac conduction system disease.   On June 18 he was seen in the emergency room for a single episode of chest discomfort associated with recurrent dizziness. He was not short of breath. The chest discomfort was described as midsternal pressure and lasted for only a few minutes. The cardiac enzymes were checked and found to be normal on 2 consecutive assays. The electrocardiogram was interpreted as showing "probable sinus rhythm", but on my review shows atrial flutter with 4:1 atrioventricular block. The atrial rate is approximately 270 bpm and a ventricular rate was controlled at 64 bpm. No repolarization abnormalities were seen other than those associated with his chronic right bundle branch block. The symptoms resolved spontaneously.  He has not had any palpitations and the dizziness has not returned since that event. He denies focal neurological events or any bleeding problems. He has not had complaints of shortness of breath, leg edema or claudication. Denies unexplained weight changes, cough, hemoptysis, intolerance to heat or cold, syncope, exertional angina  Today's electrocardiogram shows sinus bradycardia with a very long first-degree AV block at 322 ms and a single PAC. The right bundle branch block is unchanged.    Past Medical History:  Diagnosis Date  . BPH (benign prostatic hyperplasia)   . Diverticulosis   . Pericardial effusion     Past Surgical History:  Procedure Laterality Date  . APPENDECTOMY  1960  . CATARACT EXTRACTION  2009   both eyes  . CHOLECYSTECTOMY  2000  . INGUINAL HERNIA REPAIR  1980's  . TOTAL  HIP ARTHROPLASTY  2003   right  . WRIST SURGERY  2009   left    Current Medications: Outpatient Medications Prior to Visit  Medication Sig Dispense Refill  . acetic acid-hydrocortisone (VOSOL-HC) otic solution Place 5 drops into both ears See admin instructions. Instill 5 drops into each ear for 5 days ( end of each month)    . finasteride (PROSCAR) 5 MG tablet Take 5 mg by mouth daily.    Marland Kitchen lisinopril (PRINIVIL,ZESTRIL) 10 MG tablet TAKE 1 TABLET EACH DAY. 30 tablet 9  . aspirin EC 81 MG tablet Take 243 mg by mouth once.    Marland Kitchen HYDROcodone-acetaminophen (NORCO/VICODIN) 5-325 MG tablet Take 1 tablet by mouth every 8 (eight) hours as needed for moderate pain or severe pain. (Patient not taking: Reported on 10/06/2015) 5 tablet 0   No facility-administered medications prior to visit.      Allergies:   Erythromycin and Penicillins   Social History   Social History  . Marital status: Married    Spouse name: N/A  . Number of children: N/A  . Years of education: N/A   Social History Main Topics  . Smoking status: Never Smoker  . Smokeless tobacco: Never Used  . Alcohol use No  . Drug use: No  . Sexual activity: Not Asked   Other Topics Concern  . None   Social History Narrative  . None     Family History:  The patient's family history includes Cancer in his sister; Diabetes in his sister; Heart attack in his father; Heart failure in  his brother; Stroke in his brother and mother.   ROS:   Please see the history of present illness.    ROS All other systems reviewed and are negative.   PHYSICAL EXAM:   VS:  BP (!) 150/76 (BP Location: Right Arm, Patient Position: Sitting, Cuff Size: Normal)   Pulse 67   Ht 6\' 3"  (1.905 m)   Wt 210 lb (95.3 kg)   SpO2 97%   BMI 26.25 kg/m    GEN: Well nourished, well developed, in no acute distress  HEENT: normal  Neck: no JVD, carotid bruits, or masses Cardiac: Widely split second heart sound,  RRR; no murmurs, rubs, or gallops,no  edema  Respiratory:  clear to auscultation bilaterally, normal work of breathing GI: soft, nontender, nondistended, + BS MS: no deformity or atrophy  Skin: warm and dry, no rash Neuro:  Alert and Oriented x 3, Strength and sensation are intact Psych: euthymic mood, full affect  Wt Readings from Last 3 Encounters:  10/06/15 210 lb (95.3 kg)  07/31/15 205 lb (93 kg)  12/21/14 205 lb (93 kg)      Studies/Labs Reviewed:   EKG:  EKG is ordered today.  The ekg ordered today demonstrates Sinus rhythm with single PAC and very long first-degree AV block, chronic right bundle branch block  Recent Labs: 07/31/2015: BUN 22; Creatinine, Ser 0.94; Hemoglobin 12.2; Platelets 181; Potassium 3.9; Sodium 137     ASSESSMENT:    1. Typical atrial flutter ([place])   2. Essential hypertension   3. Pericardial effusion   4. RBBB and first degree atrioventricular block      PLAN:  In order of problems listed above:  1. AFlutter: CHADSVasc 3 (age, HTN), but the embolic stroke risk is lower with flutter and as far as I can tell this is the only arrhythmic event he has ever had. I am a little reluctant to prescribe full anticoagulation to this 82 year old gentleman without a little more data. I have recommended that he wear a 30 day event monitor. He will start aspirin 81 mg daily. If more events of atrial arrhythmia are recorded, will have to sit down and discuss the risk/benefit of treatment with a direct oral anticoagulant. He does not require rate control medications and in fact he should be avoided in the context of preexisting conduction system disease. 2. HTN: Borderline high blood pressure today, usually lower. No changes are made to his medications today 3. Hx peric eff: This resolved spontaneously and no etiology was ever identified. Clinically, no signs of a large effusion. I doubt there is a connection between the arrhythmia and his previous pericardial effusion. 4. 1st deg AV block/RBBB: avoid  negative chronotropic agents.    Medication Adjustments/Labs and Tests Ordered: Current medicines are reviewed at length with the patient today.  Concerns regarding medicines are outlined above.  Medication changes, Labs and Tests ordered today are listed in the Patient Instructions below. Patient Instructions  Dr Sallyanne Kuster recommends that you continue on your current medications as directed. Please refer to the Current Medication list given to you today.  Your physician has recommended that you wear an event monitor for 30 days. Event monitors are medical devices that record the heart's electrical activity. Doctors most often [place] these monitors to diagnose arrhythmias. Arrhythmias are problems with the speed or rhythm of the heartbeat. The monitor is a small, portable device. You can wear one while you do your normal daily activities. This is usually used to diagnose what is  causing palpitations/syncope (passing out).  Dr Sallyanne Kuster recommends that you schedule a follow-up appointment in 6 months.  You will receive a reminder letter in the mail two months in advance. If you don't receive a letter, please call our office to schedule the follow-up appointment.  If you need a refill on your cardiac medications before your next appointment, please call your pharmacy.    Signed, Sanda Klein, MD  10/06/2015 12:18 PM    [address], [place], [place]  [address] Phone: [phone]; Fax: [phone]

## 2015-10-06 NOTE — Addendum Note (Signed)
Addended by: Diana Eves on: 10/06/2015 03:25 PM   Modules accepted: Orders

## 2015-10-06 NOTE — Patient Instructions (Addendum)
Dr Sallyanne Kuster has recommended making the following medication changes: 1. START Aspirin 81 mg - take 1 tablet by mouth daily  Your physician has recommended that you wear an event monitor for 30 days. Event monitors are medical devices that record the heart's electrical activity. Doctors most often [place] these monitors to diagnose arrhythmias. Arrhythmias are problems with the speed or rhythm of the heartbeat. The monitor is a small, portable device. You can wear one while you do your normal daily activities. This is usually used to diagnose what is causing palpitations/syncope (passing out).  Dr Sallyanne Kuster recommends that you schedule a follow-up appointment in 6 months. You will receive a reminder letter in the mail two months in advance. If you don't receive a letter, please call our office to schedule the follow-up appointment.  If you need a refill on your cardiac medications before your next appointment, please call your pharmacy.

## 2015-11-24 ENCOUNTER — Encounter: Payer: Self-pay | Admitting: Cardiovascular Disease

## 2015-11-24 DIAGNOSIS — I441 Atrioventricular block, second degree: Secondary | ICD-10-CM | POA: Insufficient documentation

## 2015-11-24 DIAGNOSIS — Z23 Encounter for immunization: Secondary | ICD-10-CM | POA: Diagnosis not present

## 2016-01-12 DIAGNOSIS — H903 Sensorineural hearing loss, bilateral: Secondary | ICD-10-CM | POA: Diagnosis not present

## 2016-02-22 ENCOUNTER — Other Ambulatory Visit: Payer: Self-pay | Admitting: Cardiovascular Disease

## 2016-03-05 DIAGNOSIS — M1990 Unspecified osteoarthritis, unspecified site: Secondary | ICD-10-CM | POA: Diagnosis not present

## 2016-03-05 DIAGNOSIS — N401 Enlarged prostate with lower urinary tract symptoms: Secondary | ICD-10-CM | POA: Diagnosis not present

## 2016-03-05 DIAGNOSIS — I129 Hypertensive chronic kidney disease with stage 1 through stage 4 chronic kidney disease, or unspecified chronic kidney disease: Secondary | ICD-10-CM | POA: Diagnosis not present

## 2016-03-05 DIAGNOSIS — I872 Venous insufficiency (chronic) (peripheral): Secondary | ICD-10-CM | POA: Diagnosis not present

## 2016-03-05 DIAGNOSIS — Z6826 Body mass index (BMI) 26.0-26.9, adult: Secondary | ICD-10-CM | POA: Diagnosis not present

## 2016-03-05 DIAGNOSIS — Z1389 Encounter for screening for other disorder: Secondary | ICD-10-CM | POA: Diagnosis not present

## 2016-03-05 DIAGNOSIS — N182 Chronic kidney disease, stage 2 (mild): Secondary | ICD-10-CM | POA: Diagnosis not present

## 2016-03-05 DIAGNOSIS — I313 Pericardial effusion (noninflammatory): Secondary | ICD-10-CM | POA: Diagnosis not present

## 2016-03-05 DIAGNOSIS — I10 Essential (primary) hypertension: Secondary | ICD-10-CM | POA: Diagnosis not present

## 2016-03-05 DIAGNOSIS — R7301 Impaired fasting glucose: Secondary | ICD-10-CM | POA: Diagnosis not present

## 2016-03-05 DIAGNOSIS — R2681 Unsteadiness on feet: Secondary | ICD-10-CM | POA: Diagnosis not present

## 2016-08-06 DIAGNOSIS — R7301 Impaired fasting glucose: Secondary | ICD-10-CM | POA: Diagnosis not present

## 2016-08-06 DIAGNOSIS — Z125 Encounter for screening for malignant neoplasm of prostate: Secondary | ICD-10-CM | POA: Diagnosis not present

## 2016-08-06 DIAGNOSIS — I129 Hypertensive chronic kidney disease with stage 1 through stage 4 chronic kidney disease, or unspecified chronic kidney disease: Secondary | ICD-10-CM | POA: Diagnosis not present

## 2016-08-06 DIAGNOSIS — I10 Essential (primary) hypertension: Secondary | ICD-10-CM | POA: Diagnosis not present

## 2016-08-13 DIAGNOSIS — Z6826 Body mass index (BMI) 26.0-26.9, adult: Secondary | ICD-10-CM | POA: Diagnosis not present

## 2016-08-13 DIAGNOSIS — I872 Venous insufficiency (chronic) (peripheral): Secondary | ICD-10-CM | POA: Diagnosis not present

## 2016-08-13 DIAGNOSIS — Z0000 Encounter for general adult medical examination without abnormal findings: Secondary | ICD-10-CM | POA: Diagnosis not present

## 2016-08-13 DIAGNOSIS — I10 Essential (primary) hypertension: Secondary | ICD-10-CM | POA: Diagnosis not present

## 2016-08-13 DIAGNOSIS — Z23 Encounter for immunization: Secondary | ICD-10-CM | POA: Diagnosis not present

## 2016-08-13 DIAGNOSIS — N401 Enlarged prostate with lower urinary tract symptoms: Secondary | ICD-10-CM | POA: Diagnosis not present

## 2016-08-13 DIAGNOSIS — R2681 Unsteadiness on feet: Secondary | ICD-10-CM | POA: Diagnosis not present

## 2016-08-13 DIAGNOSIS — Z1212 Encounter for screening for malignant neoplasm of rectum: Secondary | ICD-10-CM | POA: Diagnosis not present

## 2016-08-13 DIAGNOSIS — Z1389 Encounter for screening for other disorder: Secondary | ICD-10-CM | POA: Diagnosis not present

## 2016-08-13 DIAGNOSIS — N182 Chronic kidney disease, stage 2 (mild): Secondary | ICD-10-CM | POA: Diagnosis not present

## 2016-08-13 DIAGNOSIS — R413 Other amnesia: Secondary | ICD-10-CM | POA: Diagnosis not present

## 2016-08-13 DIAGNOSIS — R7301 Impaired fasting glucose: Secondary | ICD-10-CM | POA: Diagnosis not present

## 2016-08-13 DIAGNOSIS — I129 Hypertensive chronic kidney disease with stage 1 through stage 4 chronic kidney disease, or unspecified chronic kidney disease: Secondary | ICD-10-CM | POA: Diagnosis not present

## 2016-08-28 DIAGNOSIS — L565 Disseminated superficial actinic porokeratosis (DSAP): Secondary | ICD-10-CM | POA: Diagnosis not present

## 2016-08-28 DIAGNOSIS — D1801 Hemangioma of skin and subcutaneous tissue: Secondary | ICD-10-CM | POA: Diagnosis not present

## 2016-08-28 DIAGNOSIS — D225 Melanocytic nevi of trunk: Secondary | ICD-10-CM | POA: Diagnosis not present

## 2016-08-28 DIAGNOSIS — D2271 Melanocytic nevi of right lower limb, including hip: Secondary | ICD-10-CM | POA: Diagnosis not present

## 2016-08-28 DIAGNOSIS — L821 Other seborrheic keratosis: Secondary | ICD-10-CM | POA: Diagnosis not present

## 2016-08-28 DIAGNOSIS — Z85828 Personal history of other malignant neoplasm of skin: Secondary | ICD-10-CM | POA: Diagnosis not present

## 2016-08-28 DIAGNOSIS — L738 Other specified follicular disorders: Secondary | ICD-10-CM | POA: Diagnosis not present

## 2016-09-11 DIAGNOSIS — H35373 Puckering of macula, bilateral: Secondary | ICD-10-CM | POA: Diagnosis not present

## 2016-09-11 DIAGNOSIS — H524 Presbyopia: Secondary | ICD-10-CM | POA: Diagnosis not present

## 2016-09-11 DIAGNOSIS — Z961 Presence of intraocular lens: Secondary | ICD-10-CM | POA: Diagnosis not present

## 2016-11-02 ENCOUNTER — Ambulatory Visit (INDEPENDENT_AMBULATORY_CARE_PROVIDER_SITE_OTHER): Payer: Medicare Other | Admitting: Cardiovascular Disease

## 2016-11-02 ENCOUNTER — Encounter: Payer: Self-pay | Admitting: Cardiovascular Disease

## 2016-11-02 VITALS — BP 142/66 | HR 63 | Ht 75.0 in | Wt 209.0 lb

## 2016-11-02 DIAGNOSIS — I441 Atrioventricular block, second degree: Secondary | ICD-10-CM | POA: Diagnosis not present

## 2016-11-02 DIAGNOSIS — I10 Essential (primary) hypertension: Secondary | ICD-10-CM | POA: Diagnosis not present

## 2016-11-02 DIAGNOSIS — I483 Typical atrial flutter: Secondary | ICD-10-CM | POA: Diagnosis not present

## 2016-11-02 DIAGNOSIS — I3139 Other pericardial effusion (noninflammatory): Secondary | ICD-10-CM

## 2016-11-02 DIAGNOSIS — I313 Pericardial effusion (noninflammatory): Secondary | ICD-10-CM

## 2016-11-02 NOTE — Progress Notes (Signed)
Cardiology Office Note    Date:  11/03/2016   ID:  Terry Gregory, DOB 04-29-1921, MRN [id number]  PCP:  Burnard Bunting, MD  Cardiologist:   Sanda Klein, MD   Chief complaint: Follow-up idiopathic pericardial effusion, hypertension, cardiac conduction system disease.  History of Present Illness:  Terry Gregory is a 83 y.o. male returning in follow-up for idiopathic pericardial effusion, hypertension and cardiac conduction system disease.   The last year has really been quite uneventful. His only real complaint is nocturia and nocturnal leg cramps. He uses the bathroom 4 times a night on the average. His urologist advised him to avoid drinking water after 7 PM. He has had some muscle spasms in his shins, which are possibly related to the lower intake of water). He has already had 2 prostate surgeries and there are no plans for further surgical interventions.  He has not had problems with syncope, dizziness or lightheadedness. He walks cautiously due to unsteady gait. He denies angina or dyspnea with activity.  He has not had palpitations and has been no documentation of atrial flutter since the event that occurred about a year ago in June 2017. At that time he had asymptomatic atrial flutter with 4:1 AV conduction and a ventricular rate of 64 bpm. Recurrent arrhythmia was not seen on a subsequent 30 day event monitor. In view of his advanced age, low prevalence of the arrhythmia, absence of history of neurological events,, rather unsteady gait and personal preference, anticoagulants were not prescribed.  His electric cardiogram today shows sinus rhythm with second-degree AV block Mobitz type I with a ventricular rate of 62 bpm, pre-existing right bundle branch block (QRS 142 ms) and normal QTC 460 ms. No ischemic repolarization abnormalities are seen.  Past Medical History:  Diagnosis Date  . BPH (benign prostatic hyperplasia)   . Diverticulosis   . Pericardial effusion     Past  Surgical History:  Procedure Laterality Date  . APPENDECTOMY  1960  . CATARACT EXTRACTION  2009   both eyes  . CHOLECYSTECTOMY  2000  . INGUINAL HERNIA REPAIR  1980's  . TOTAL HIP ARTHROPLASTY  2003   right  . WRIST SURGERY  2009   left    Current Medications: Outpatient Medications Prior to Visit  Medication Sig Dispense Refill  . acetic acid-hydrocortisone (VOSOL-HC) otic solution Place 5 drops into both ears See admin instructions. Instill 5 drops into each ear for 5 days ( end of each month)    . lisinopril (PRINIVIL,ZESTRIL) 10 MG tablet TAKE 1 TABLET EACH DAY. 30 tablet 0  . finasteride (PROSCAR) 5 MG tablet Take 5 mg by mouth daily.     No facility-administered medications prior to visit.      Allergies:   Erythromycin and Penicillins   Social History   Social History  . Marital status: Married    Spouse name: N/A  . Number of children: N/A  . Years of education: N/A   Social History Main Topics  . Smoking status: Never Smoker  . Smokeless tobacco: Never Used  . Alcohol use No  . Drug use: No  . Sexual activity: Not Asked   Other Topics Concern  . None   Social History Narrative  . None     Family History:  The patient's family history includes Cancer in his sister; Diabetes in his sister; Heart attack in his father; Heart failure in his brother; Stroke in his brother and mother.   ROS:   Please see the  history of present illness.    ROS All other systems reviewed and are negative.   PHYSICAL EXAM:   VS:  BP (!) 142/66 (BP Location: Right Arm, Patient Position: Sitting, Cuff Size: Normal)   Pulse 63   Ht 6\' 3"  (1.905 m)   Wt 209 lb (94.8 kg)   BMI 26.12 kg/m     General: Alert, oriented x3, no distress, appears well, but elderly Head: no evidence of trauma, PERRL, EOMI, no exophtalmos or lid lag, no myxedema, no xanthelasma; normal ears, nose and oropharynx Neck: normal jugular venous pulsations and no hepatojugular reflux; brisk carotid pulses  without delay and no carotid bruits Chest: clear to auscultation, no signs of consolidation by percussion or palpation, normal fremitus, symmetrical and full respiratory excursions Cardiovascular: normal position and quality of the apical impulse, regular rhythm with occasional pauses, normal first and widely split second heart sounds, no murmurs, rubs or gallops Abdomen: no tenderness or distention, no masses by palpation, no abnormal pulsatility or arterial bruits, normal bowel sounds, no hepatosplenomegaly Extremities: no clubbing, cyanosis or edema; 2+ radial, ulnar and brachial pulses bilaterally; 2+ right femoral, posterior tibial and dorsalis pedis pulses; 2+ left femoral, posterior tibial and dorsalis pedis pulses; no subclavian or femoral bruits Neurological: grossly nonfocal Psych: Normal mood and affect\  Wt Readings from Last 3 Encounters:  11/02/16 209 lb (94.8 kg)  10/06/15 210 lb (95.3 kg)  07/31/15 205 lb (93 kg)      Studies/Labs Reviewed:   EKG:  EKG is ordered today.  The ekg ordered today demonstrates sinus rhythm, Mobitz type I second-degree AV block with long cycles, 9:8, 8:7.  ASSESSMENT:    1. Typical atrial flutter ([place])   2. Essential hypertension   3. Pericardial effusion   4. Second degree AV block, Mobitz type I      PLAN:  In order of problems listed above:  1. AFlutter: Only one event has ever been documented. Embolic risk is relatively low and probably matches the risk of bleeding in this elderly gentleman. CHADSVasc 3 (age, HTN). Rate control occurred spontaneously due to the presence of AV conduction system disease. Antiarrhythmics are not indicated. 2. HTN: Recheck blood pressure was 132/62 mmHg, no changes in medications 3. Hx peric eff: This resolved spontaneously and no etiology was ever identified. Clinically, no signs of a large effusion. I doubt there is a connection between the arrhythmia and his previous pericardial  effusion. 4. Second-degree AV block: avoid negative chronotropic agents. At this point pacemaker therapy is not indicated in the absence of symptoms or significant bradycardia. Asked to promptly report episodes of syncope or near syncope.    Medication Adjustments/Labs and Tests Ordered: Current medicines are reviewed at length with the patient today.  Concerns regarding medicines are outlined above.  Medication changes, Labs and Tests ordered today are listed in the Patient Instructions below. Patient Instructions  Dr Sallyanne Kuster recommends that you schedule a follow-up appointment in 12 months. You will receive a reminder letter in the mail two months in advance. If you don't receive a letter, please call our office to schedule the follow-up appointment.  If you need a refill on your cardiac medications before your next appointment, please call your pharmacy.    Signed, Sanda Klein, MD  11/03/2016 4:37 PM    [address], [place], [place]  [address] Phone: [phone]; Fax: [phone]

## 2016-11-02 NOTE — Patient Instructions (Signed)
Dr Croitoru recommends that you schedule a follow-up appointment in 12 months. You will receive a reminder letter in the mail two months in advance. If you don't receive a letter, please call our office to schedule the follow-up appointment.  If you need a refill on your cardiac medications before your next appointment, please call your pharmacy. 

## 2016-11-03 DIAGNOSIS — I441 Atrioventricular block, second degree: Secondary | ICD-10-CM | POA: Insufficient documentation

## 2016-11-24 DIAGNOSIS — Z23 Encounter for immunization: Secondary | ICD-10-CM | POA: Diagnosis not present

## 2017-01-05 DIAGNOSIS — S51801A Unspecified open wound of right forearm, initial encounter: Secondary | ICD-10-CM | POA: Diagnosis not present

## 2017-02-27 DIAGNOSIS — I872 Venous insufficiency (chronic) (peripheral): Secondary | ICD-10-CM | POA: Diagnosis not present

## 2017-02-27 DIAGNOSIS — N182 Chronic kidney disease, stage 2 (mild): Secondary | ICD-10-CM | POA: Diagnosis not present

## 2017-02-27 DIAGNOSIS — R7301 Impaired fasting glucose: Secondary | ICD-10-CM | POA: Diagnosis not present

## 2017-02-27 DIAGNOSIS — N401 Enlarged prostate with lower urinary tract symptoms: Secondary | ICD-10-CM | POA: Diagnosis not present

## 2017-02-27 DIAGNOSIS — M1990 Unspecified osteoarthritis, unspecified site: Secondary | ICD-10-CM | POA: Diagnosis not present

## 2017-02-27 DIAGNOSIS — R609 Edema, unspecified: Secondary | ICD-10-CM | POA: Diagnosis not present

## 2017-02-27 DIAGNOSIS — I129 Hypertensive chronic kidney disease with stage 1 through stage 4 chronic kidney disease, or unspecified chronic kidney disease: Secondary | ICD-10-CM | POA: Diagnosis not present

## 2017-02-27 DIAGNOSIS — I10 Essential (primary) hypertension: Secondary | ICD-10-CM | POA: Diagnosis not present

## 2017-02-27 DIAGNOSIS — Z1389 Encounter for screening for other disorder: Secondary | ICD-10-CM | POA: Diagnosis not present

## 2017-02-27 DIAGNOSIS — R413 Other amnesia: Secondary | ICD-10-CM | POA: Diagnosis not present

## 2017-02-27 DIAGNOSIS — Z6826 Body mass index (BMI) 26.0-26.9, adult: Secondary | ICD-10-CM | POA: Diagnosis not present

## 2017-02-27 DIAGNOSIS — R2681 Unsteadiness on feet: Secondary | ICD-10-CM | POA: Diagnosis not present

## 2017-06-22 IMAGING — CR DG HIP (WITH OR WITHOUT PELVIS) 2-3V*R*
3 series · 3 of 3 positions shown · non-contrast
Comparison: CT scan 06/23/2014

CLINICAL DATA: Fell tonight at home an injured back and right hip.

EXAM:
DG HIP (WITH OR WITHOUT PELVIS) 2-3V RIGHT; THORACIC SPINE 2 VIEWS ;
LUMBAR SPINE - COMPLETE 4+ VIEW

[t pelvis a.p.]
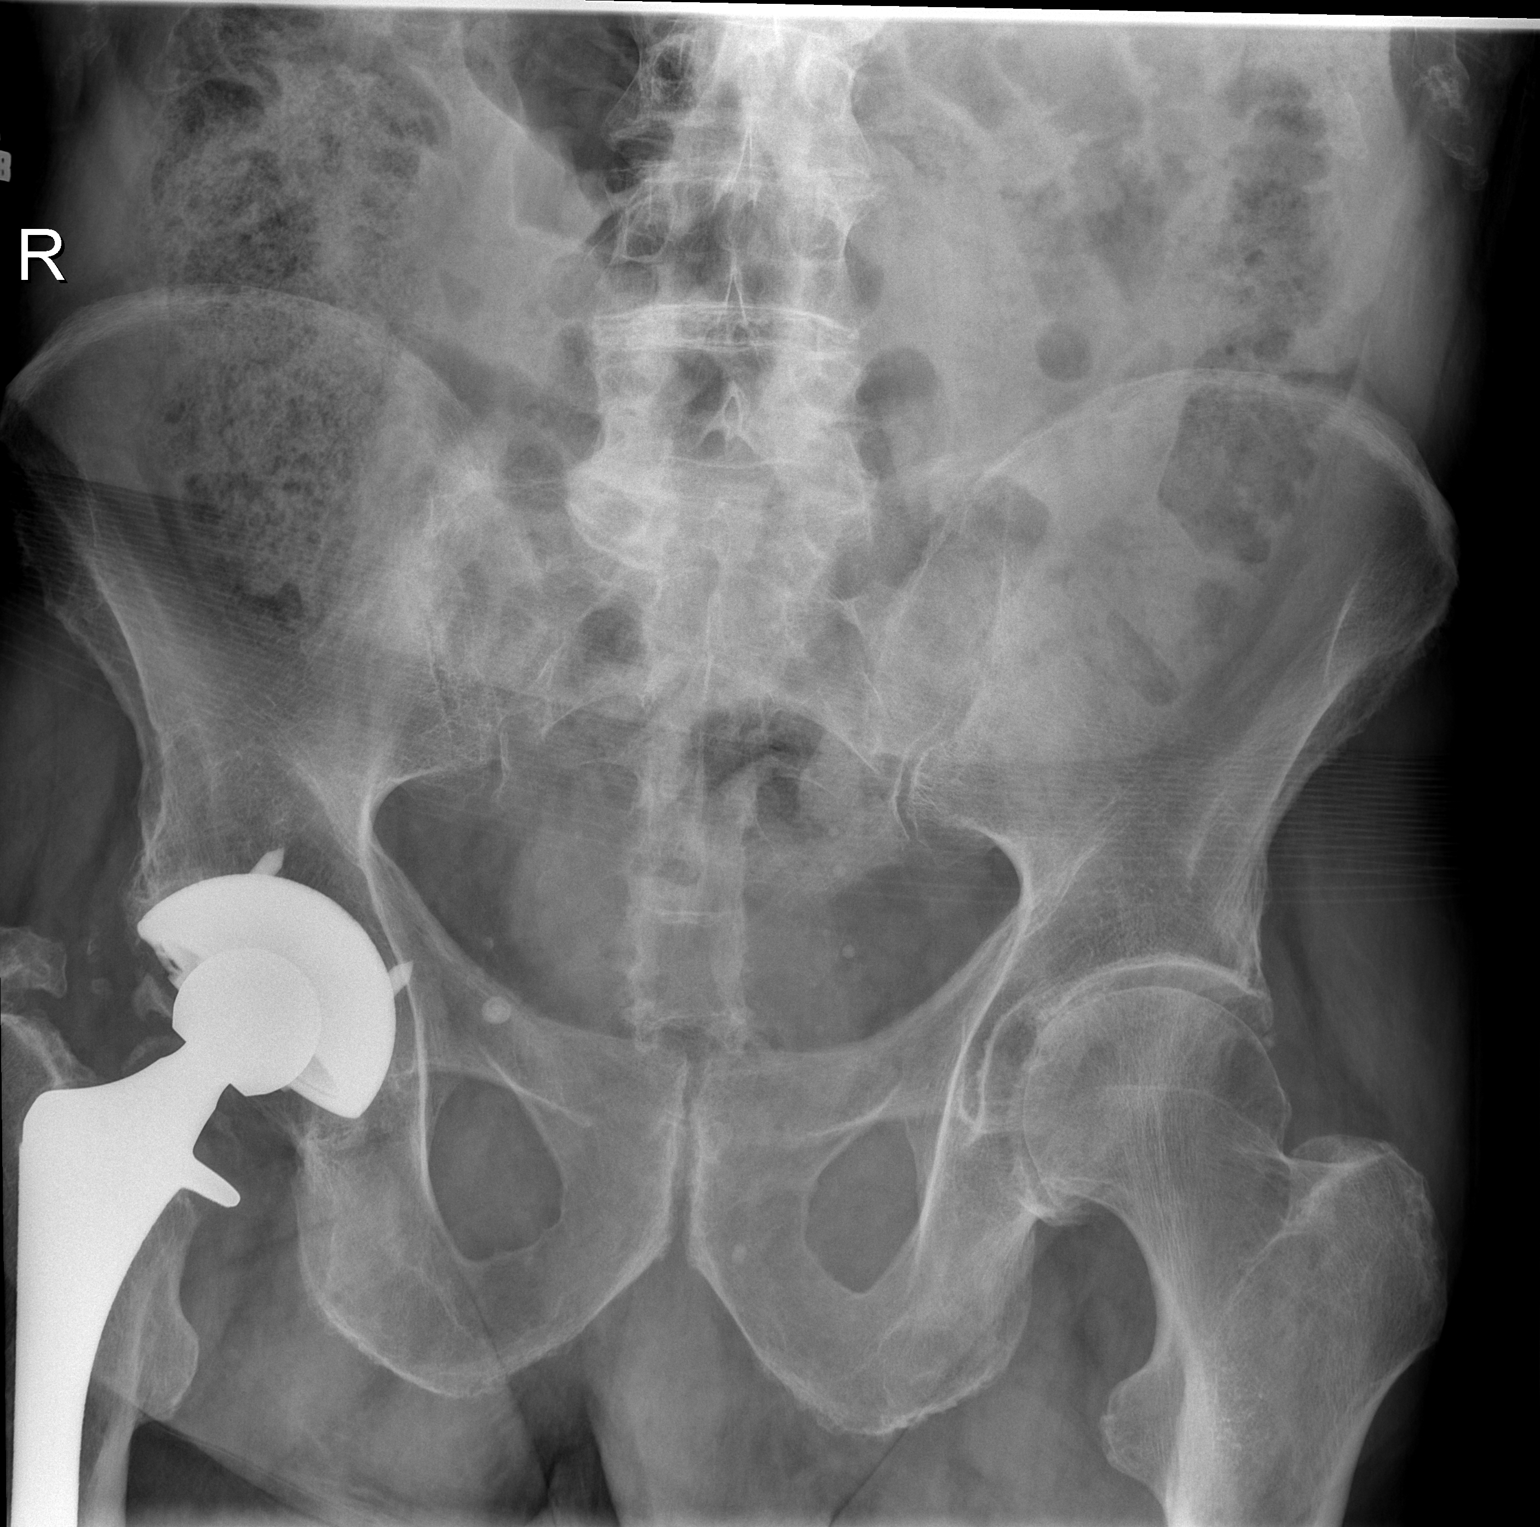

[t hip frog leg right]
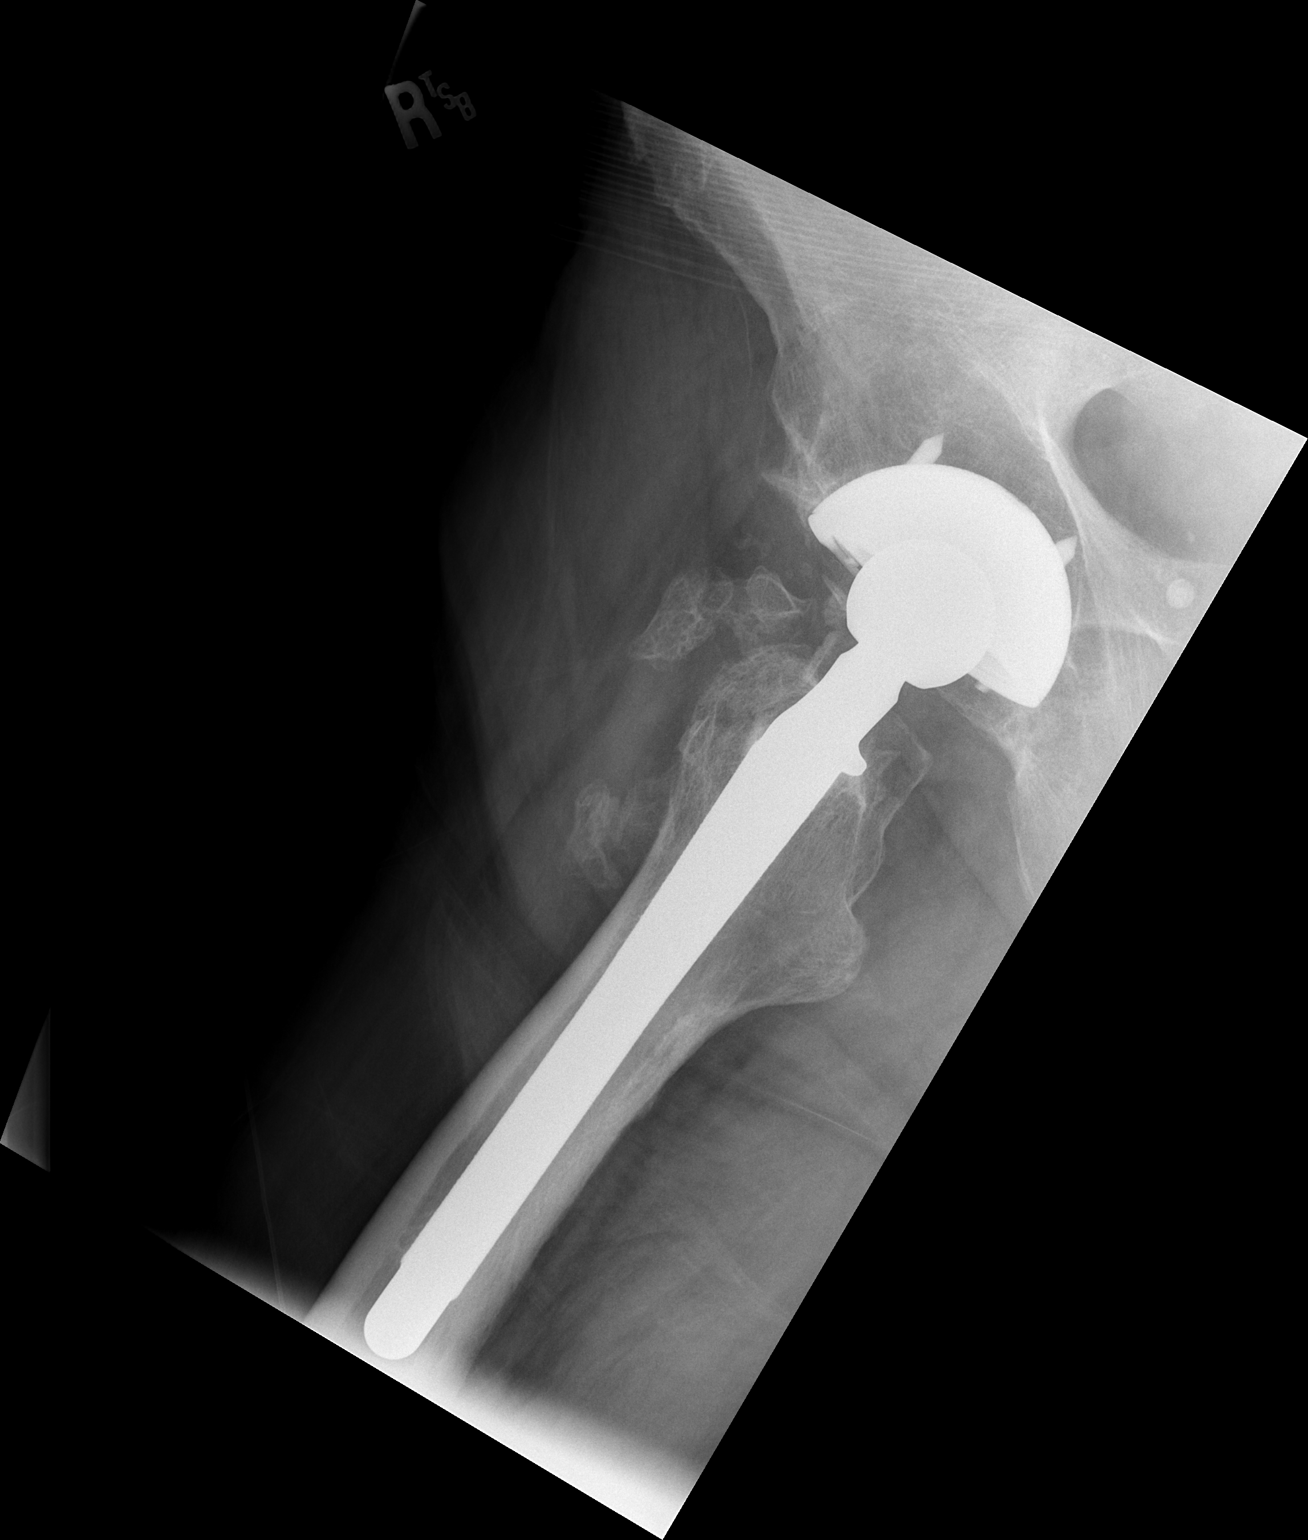

[t hip ap right]
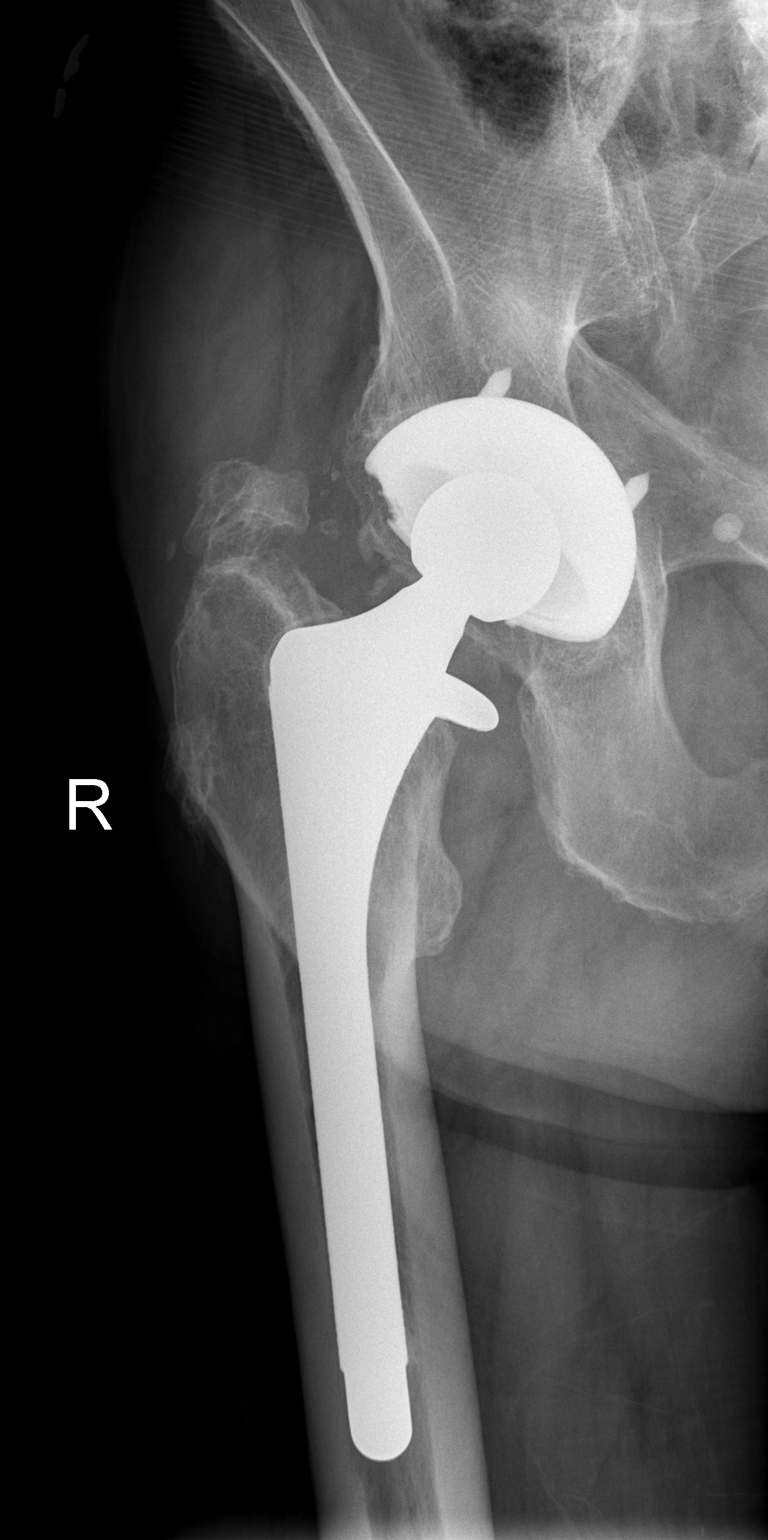

[3 of 3 positions shown; findings below may reference images not displayed]

FINDINGS: Right hip:

The right hip prosthesis is intact. No periprosthetic fracture.
Stable areas of heterotopic ossification. The left hip is intact.
The pubic symphysis and SI joints are intact. Degenerative changes
are noted. No definite pelvic fractures.

Thoracic spine:

Normal alignment of the thoracic vertebral bodies. No acute
fracture. Mild degenerative changes for age. No abnormal paraspinal
soft tissue swelling. The visualized posterior ribs are intact.

Lumbar spine:

Normal alignment and no acute bony findings. Moderate stable
degenerative changes.
IMPRESSION: No acute bony findings.

## 2017-06-22 IMAGING — CR DG LUMBAR SPINE COMPLETE 4+V
5 series · 5 of 5 positions shown · non-contrast
Comparison: CT scan 06/23/2014

CLINICAL DATA: Fell tonight at home an injured back and right hip.

EXAM:
DG HIP (WITH OR WITHOUT PELVIS) 2-3V RIGHT; THORACIC SPINE 2 VIEWS ;
LUMBAR SPINE - COMPLETE 4+ VIEW

[t l-spine a.p.]
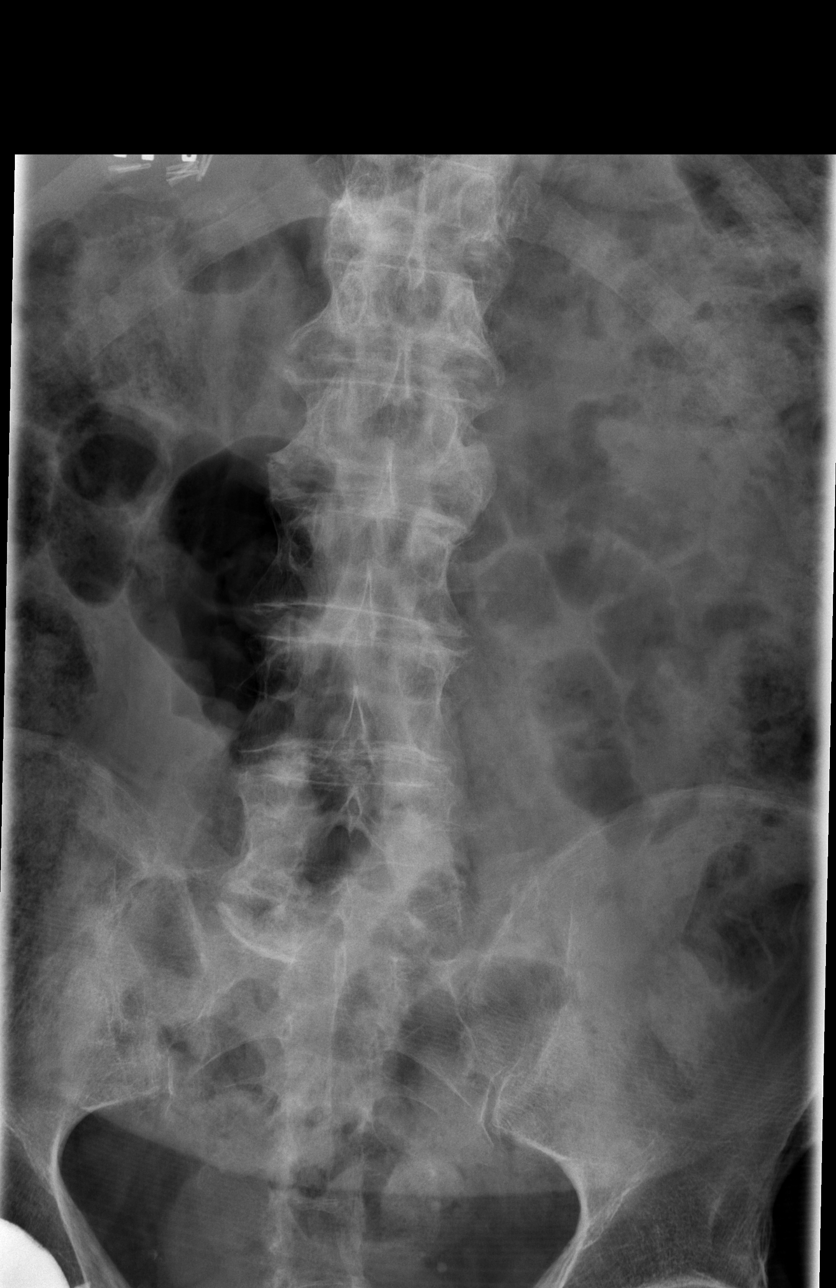

[t l-spine oblique exposure (1 of 2)]
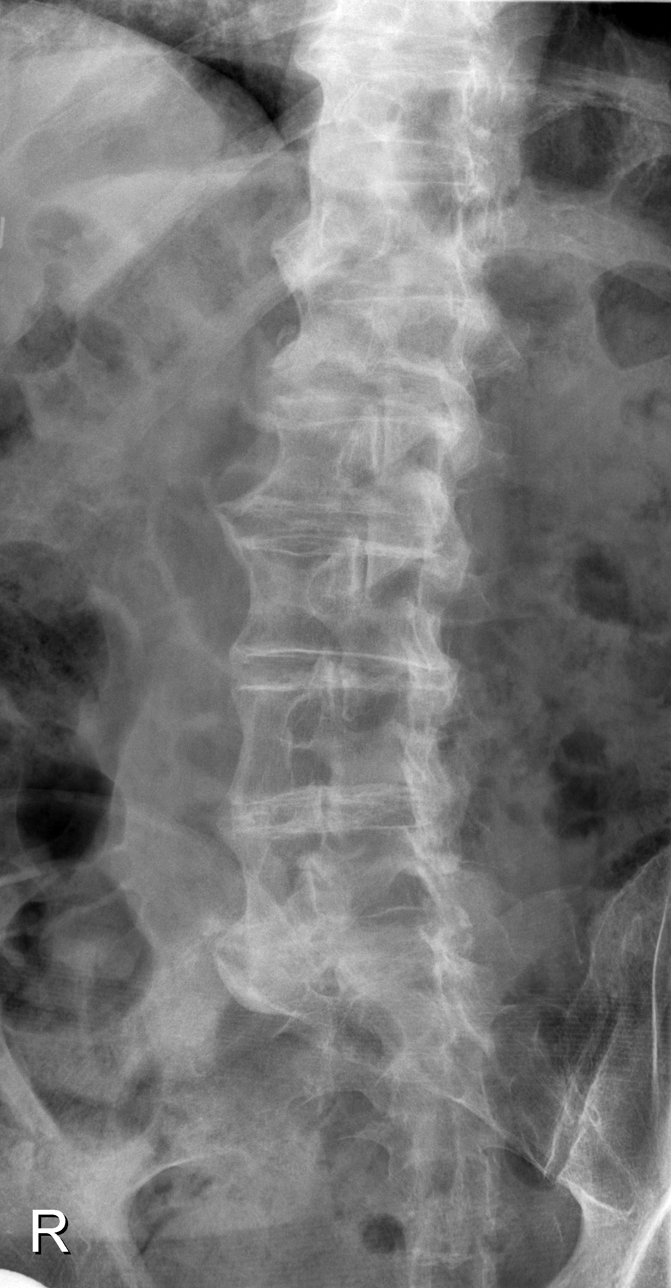

[t l-spine oblique exposure (2 of 2)]
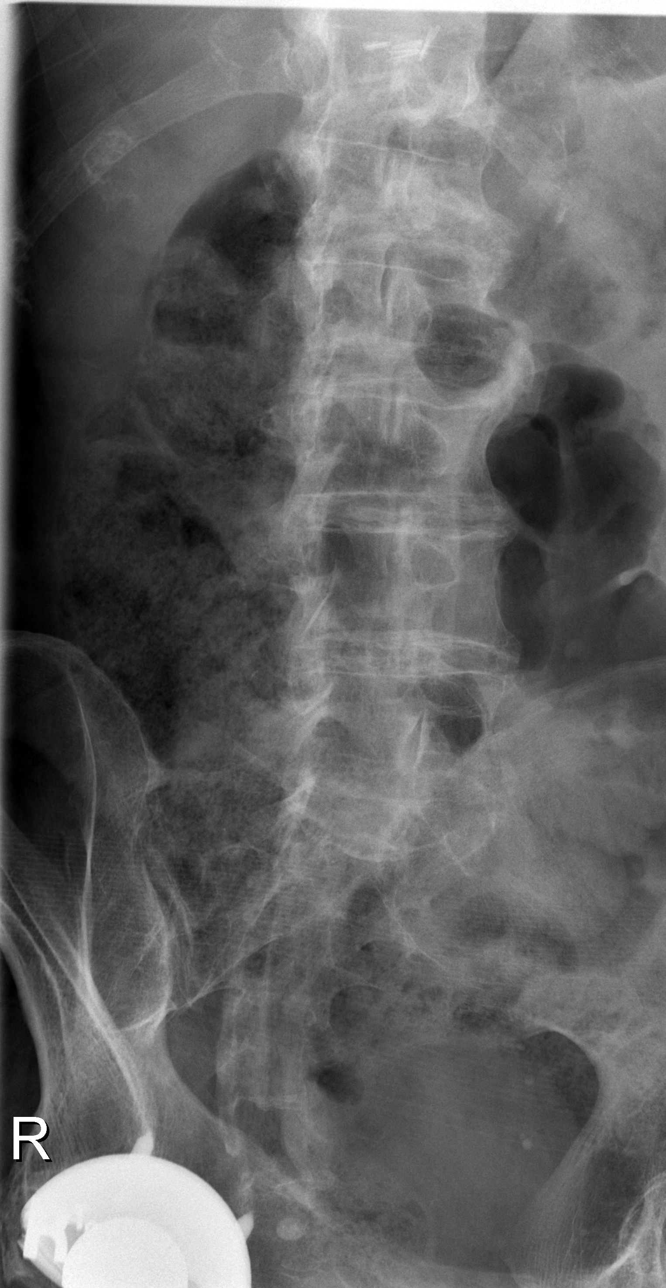

[t l-spine lat]
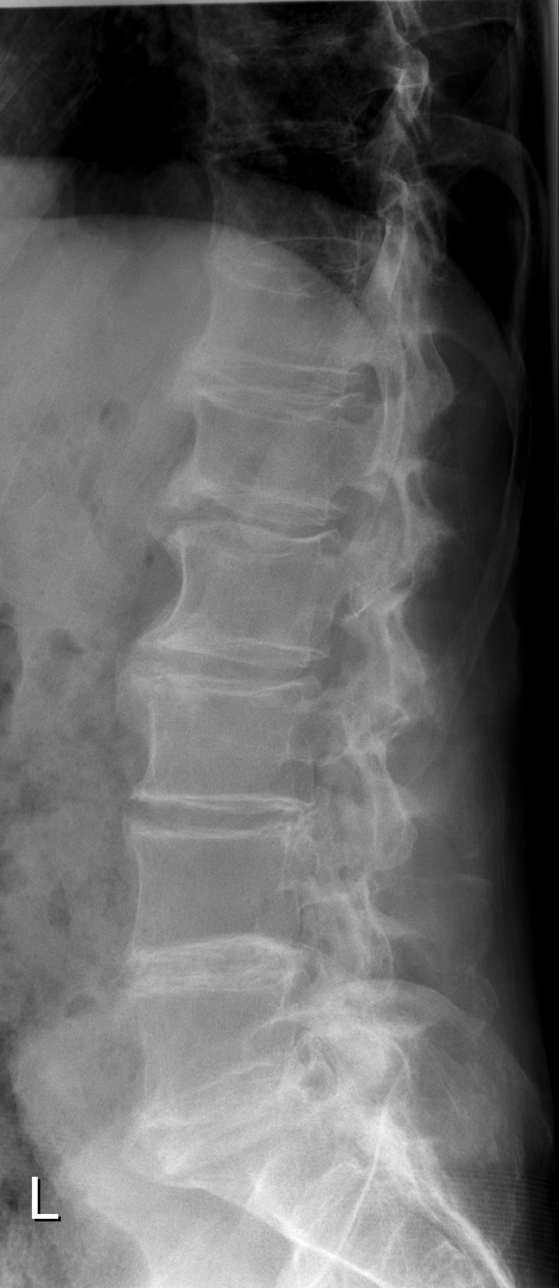

[t l-spine l5-s1 spot]
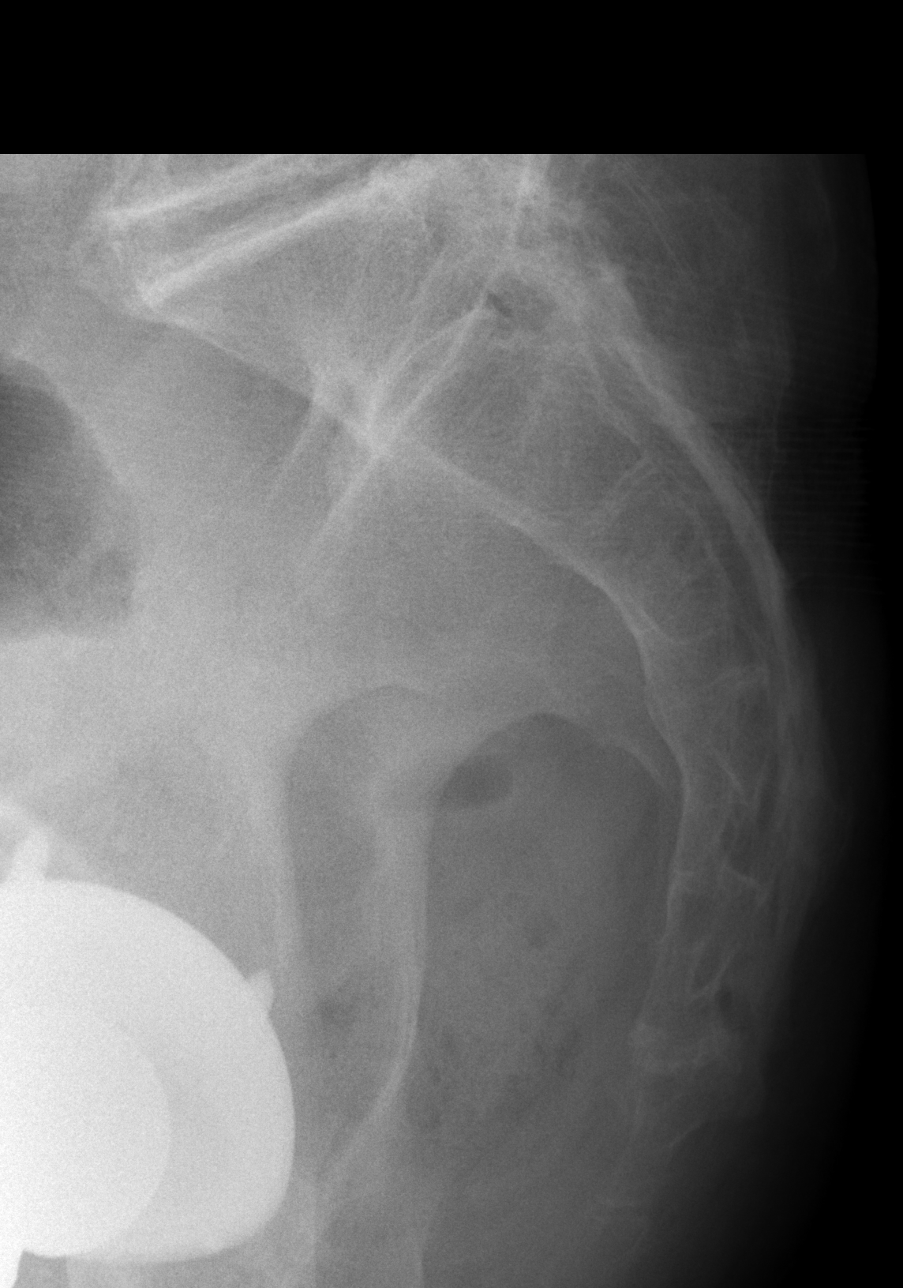

[5 of 5 positions shown; findings below may reference images not displayed]

FINDINGS: Right hip:

The right hip prosthesis is intact. No periprosthetic fracture.
Stable areas of heterotopic ossification. The left hip is intact.
The pubic symphysis and SI joints are intact. Degenerative changes
are noted. No definite pelvic fractures.

Thoracic spine:

Normal alignment of the thoracic vertebral bodies. No acute
fracture. Mild degenerative changes for age. No abnormal paraspinal
soft tissue swelling. The visualized posterior ribs are intact.

Lumbar spine:

Normal alignment and no acute bony findings. Moderate stable
degenerative changes.
IMPRESSION: No acute bony findings.

## 2017-08-14 DIAGNOSIS — Z125 Encounter for screening for malignant neoplasm of prostate: Secondary | ICD-10-CM | POA: Diagnosis not present

## 2017-08-14 DIAGNOSIS — R7301 Impaired fasting glucose: Secondary | ICD-10-CM | POA: Diagnosis not present

## 2017-08-14 DIAGNOSIS — I10 Essential (primary) hypertension: Secondary | ICD-10-CM | POA: Diagnosis not present

## 2017-08-21 DIAGNOSIS — I872 Venous insufficiency (chronic) (peripheral): Secondary | ICD-10-CM | POA: Diagnosis not present

## 2017-08-21 DIAGNOSIS — N401 Enlarged prostate with lower urinary tract symptoms: Secondary | ICD-10-CM | POA: Diagnosis not present

## 2017-08-21 DIAGNOSIS — R2681 Unsteadiness on feet: Secondary | ICD-10-CM | POA: Diagnosis not present

## 2017-08-21 DIAGNOSIS — R413 Other amnesia: Secondary | ICD-10-CM | POA: Diagnosis not present

## 2017-08-21 DIAGNOSIS — I129 Hypertensive chronic kidney disease with stage 1 through stage 4 chronic kidney disease, or unspecified chronic kidney disease: Secondary | ICD-10-CM | POA: Diagnosis not present

## 2017-08-21 DIAGNOSIS — Z6826 Body mass index (BMI) 26.0-26.9, adult: Secondary | ICD-10-CM | POA: Diagnosis not present

## 2017-08-21 DIAGNOSIS — I10 Essential (primary) hypertension: Secondary | ICD-10-CM | POA: Diagnosis not present

## 2017-08-21 DIAGNOSIS — R82998 Other abnormal findings in urine: Secondary | ICD-10-CM | POA: Diagnosis not present

## 2017-08-21 DIAGNOSIS — R7301 Impaired fasting glucose: Secondary | ICD-10-CM | POA: Diagnosis not present

## 2017-08-21 DIAGNOSIS — Z0000 Encounter for general adult medical examination without abnormal findings: Secondary | ICD-10-CM | POA: Diagnosis not present

## 2017-08-21 DIAGNOSIS — M1990 Unspecified osteoarthritis, unspecified site: Secondary | ICD-10-CM | POA: Diagnosis not present

## 2017-08-21 DIAGNOSIS — N182 Chronic kidney disease, stage 2 (mild): Secondary | ICD-10-CM | POA: Diagnosis not present

## 2017-08-23 DIAGNOSIS — Z1212 Encounter for screening for malignant neoplasm of rectum: Secondary | ICD-10-CM | POA: Diagnosis not present

## 2017-08-29 DIAGNOSIS — H903 Sensorineural hearing loss, bilateral: Secondary | ICD-10-CM | POA: Diagnosis not present

## 2017-09-12 DIAGNOSIS — H524 Presbyopia: Secondary | ICD-10-CM | POA: Diagnosis not present

## 2017-09-12 DIAGNOSIS — H35373 Puckering of macula, bilateral: Secondary | ICD-10-CM | POA: Diagnosis not present

## 2017-10-16 DIAGNOSIS — D1801 Hemangioma of skin and subcutaneous tissue: Secondary | ICD-10-CM | POA: Diagnosis not present

## 2017-10-16 DIAGNOSIS — L821 Other seborrheic keratosis: Secondary | ICD-10-CM | POA: Diagnosis not present

## 2017-10-16 DIAGNOSIS — L565 Disseminated superficial actinic porokeratosis (DSAP): Secondary | ICD-10-CM | POA: Diagnosis not present

## 2017-10-16 DIAGNOSIS — D2271 Melanocytic nevi of right lower limb, including hip: Secondary | ICD-10-CM | POA: Diagnosis not present

## 2017-10-16 DIAGNOSIS — L570 Actinic keratosis: Secondary | ICD-10-CM | POA: Diagnosis not present

## 2017-10-16 DIAGNOSIS — Z85828 Personal history of other malignant neoplasm of skin: Secondary | ICD-10-CM | POA: Diagnosis not present

## 2017-12-18 ENCOUNTER — Ambulatory Visit (INDEPENDENT_AMBULATORY_CARE_PROVIDER_SITE_OTHER): Payer: Medicare Other | Admitting: Cardiovascular Disease

## 2017-12-18 ENCOUNTER — Encounter: Payer: Self-pay | Admitting: Cardiovascular Disease

## 2017-12-18 VITALS — BP 144/68 | HR 65 | Ht 75.0 in | Wt 208.0 lb

## 2017-12-18 DIAGNOSIS — I441 Atrioventricular block, second degree: Secondary | ICD-10-CM

## 2017-12-18 DIAGNOSIS — Z8679 Personal history of other diseases of the circulatory system: Secondary | ICD-10-CM

## 2017-12-18 DIAGNOSIS — I483 Typical atrial flutter: Secondary | ICD-10-CM | POA: Diagnosis not present

## 2017-12-18 DIAGNOSIS — I10 Essential (primary) hypertension: Secondary | ICD-10-CM | POA: Diagnosis not present

## 2017-12-18 NOTE — Progress Notes (Signed)
Cardiology Office Note    Date:  12/20/2017   ID:  Terry Gregory, DOB 1921/08/27, MRN [id number]  PCP:  Burnard Bunting, MD  Cardiologist:   Sanda Klein, MD   Chief complaint: Follow-up idiopathic pericardial effusion, hypertension, cardiac conduction system disease.  History of Present Illness:  Terry Gregory is a 84 y.o. male returning in follow-up for idiopathic pericardial effusion, hypertension and cardiac conduction system disease.   He feels well and does not have any cardiac complaints.The patient specifically denies any chest pain at rest exertion, dyspnea at rest or with exertion, orthopnea, paroxysmal nocturnal dyspnea, syncope, palpitations, focal neurological deficits, intermittent claudication, lower extremity edema, unexplained weight gain, cough, hemoptysis or wheezing.  He had a single event atrial arrhythmia, atrial flutter with 4: 1 AV conduction in June 2017, recurrence not seen on a subsequent event monitor. In view of his advanced age, low prevalence of the arrhythmia, absence of history of neurological events, rather unsteady gait and personal preference, anticoagulants were not prescribed.  Long-standing AV conduction abnormalities with at least one previous episode of Mobitz type I second-degree AV block, otherwise with very long first-degree AV block, right bundle branch block.  He has never describe syncope or symptoms of bradycardia.  Past Medical History:  Diagnosis Date  . BPH (benign prostatic hyperplasia)   . Diverticulosis   . Pericardial effusion     Past Surgical History:  Procedure Laterality Date  . APPENDECTOMY  1960  . CATARACT EXTRACTION  2009   both eyes  . CHOLECYSTECTOMY  2000  . INGUINAL HERNIA REPAIR  1980's  . TOTAL HIP ARTHROPLASTY  2003   right  . WRIST SURGERY  2009   left    Current Medications: Outpatient Medications Prior to Visit  Medication Sig Dispense Refill  . acetic acid-hydrocortisone (VOSOL-HC) otic solution  Place 5 drops into both ears See admin instructions. Instill 5 drops into each ear for 5 days ( end of each month)    . aspirin EC 81 MG tablet Take 81 mg by mouth daily.    . finasteride (PROSCAR) 5 MG tablet Take 1 tablet by mouth daily.    Marland Kitchen lisinopril (PRINIVIL,ZESTRIL) 10 MG tablet TAKE 1 TABLET EACH DAY. 30 tablet 0   No facility-administered medications prior to visit.      Allergies:   Erythromycin and Penicillins   Social History   Socioeconomic History  . Marital status: Married    Spouse name: Not on file  . Number of children: Not on file  . Years of education: Not on file  . Highest education level: Not on file  Occupational History  . Not on file  Social Needs  . Financial resource strain: Not on file  . Food insecurity:    Worry: Not on file    Inability: Not on file  . Transportation needs:    Medical: Not on file    Non-medical: Not on file  Tobacco Use  . Smoking status: Never Smoker  . Smokeless tobacco: Never Used  Substance and Sexual Activity  . Alcohol use: No  . Drug use: No  . Sexual activity: Not on file  Lifestyle  . Physical activity:    Days per week: Not on file    Minutes per session: Not on file  . Stress: Not on file  Relationships  . Social connections:    Talks on phone: Not on file    Gets together: Not on file    Attends religious service: Not  on file    Active member of club or organization: Not on file    Attends meetings of clubs or organizations: Not on file    Relationship status: Not on file  Other Topics Concern  . Not on file  Social History Narrative  . Not on file     Family History:  The patient's family history includes Cancer in his sister; Diabetes in his sister; Heart attack in his father; Heart failure in his brother; Stroke in his brother and mother.   ROS:   Please see the history of present illness.    ROS all other systems are reviewed and are negative   PHYSICAL EXAM:   VS:  BP (!) 144/68   Pulse  65   Ht 6\' 3"  (1.905 m)   Wt 208 lb (94.3 kg)   BMI 26.00 kg/m     General: Alert, oriented x3, no distress, appears well Head: no evidence of trauma, PERRL, EOMI, no exophtalmos or lid lag, no myxedema, no xanthelasma; normal ears, nose and oropharynx Neck: normal jugular venous pulsations and no hepatojugular reflux; brisk carotid pulses without delay and no carotid bruits Chest: clear to auscultation, no signs of consolidation by percussion or palpation, normal fremitus, symmetrical and full respiratory excursions Cardiovascular: normal position and quality of the apical impulse, regular rhythm, normal first and widely split second heart sounds, no murmurs, rubs or gallops Abdomen: no tenderness or distention, no masses by palpation, no abnormal pulsatility or arterial bruits, normal bowel sounds, no hepatosplenomegaly Extremities: no clubbing, cyanosis or edema; 2+ radial, ulnar and brachial pulses bilaterally; 2+ right femoral, posterior tibial and dorsalis pedis pulses; 2+ left femoral, posterior tibial and dorsalis pedis pulses; no subclavian or femoral bruits Neurological: grossly nonfocal Psych: Normal mood and affect   Wt Readings from Last 3 Encounters:  12/18/17 208 lb (94.3 kg)  11/02/16 209 lb (94.8 kg)  10/06/15 210 lb (95.3 kg)      Studies/Labs Reviewed:   EKG:  EKG is ordered today.  Shows sinus rhythm with a very long first-degree AV block 388 ms, a single PVC.  There are no repolarization abnormalities and the QTC is 426 ms  ASSESSMENT:    1. Typical atrial flutter ([place])   2. Essential hypertension   3. Mobitz type 1 second degree AV block   4. History of pericarditis     PLAN:  In order of problems listed above:  1. AFlutter: Only one event has ever been documented.   He does not require rate control medications due to spontaneous high-grade AV block during flutter.  He should not be given negative chronotropic agents.  The risk of anticoagulation appears  to exceed the benefit. Embolic risk is relatively low and probably matches the risk of bleeding in this elderly gentleman. CHADSVasc 3 (age 28, HTN). 2. HTN: He takes his blood pressure often at home it is consistently in the 120-130/70 range.  No changes were made for the mild elevation in systolic blood pressure recorded today 3. Hx peric eff: This resolved spontaneously and no etiology was ever identified.  He does not have any clinical findings that would suggest that he has a large pericardial effusion at this time 4. Second-degree AV block: This would justify pacemaker implantation if he should develop symptoms.  This has not occurred to date.  Avoid medications with negative chronotropic effect such as verapamil, diltiazem, beta-blockers.    Medication Adjustments/Labs and Tests Ordered: Current medicines are reviewed at length with the  patient today.  Concerns regarding medicines are outlined above.  Medication changes, Labs and Tests ordered today are listed in the Patient Instructions below. Patient Instructions  Medication Instructions:  Your physician recommends that you continue on your current medications as directed. Please refer to the Current Medication list given to you today.  If you need a refill on your cardiac medications before your next appointment, please call your pharmacy.   Lab work: none  Testing/Procedures: none  Follow-Up: At [facility], you and your health needs are our priority.  As part of our continuing mission to provide you with exceptional heart care, we have created designated Provider Care Teams.  These Care Teams include your primary Cardiologist (physician) and Advanced Practice Providers (APPs -  Physician Assistants and Nurse Practitioners) who all work together to provide you with the care you need, when you need it. You will need a follow up appointment in 12 months with Dr. Sallyanne Kuster.  Advanced Practice Providers on your designated Care  Team: [place], [place] . Fabian Sharp, PA-C  Any Other Special Instructions Will Be Listed Below (If Applicable). none      Signed, Sanda Klein, MD  12/20/2017 3:48 PM    [address], [place], [place]  [address] Phone: [phone]; Fax: [phone]

## 2017-12-18 NOTE — Patient Instructions (Signed)
Medication Instructions:  Your physician recommends that you continue on your current medications as directed. Please refer to the Current Medication list given to you today.  If you need a refill on your cardiac medications before your next appointment, please call your pharmacy.   Lab work: none  Testing/Procedures: none  Follow-Up: At [facility], you and your health needs are our priority.  As part of our continuing mission to provide you with exceptional heart care, we have created designated Provider Care Teams.  These Care Teams include your primary Cardiologist (physician) and Advanced Practice Providers (APPs -  Physician Assistants and Nurse Practitioners) who all work together to provide you with the care you need, when you need it. You will need a follow up appointment in 12 months with Dr. Sallyanne Kuster.  Advanced Practice Providers on your designated Care Team: [place], [place] . Fabian Sharp, PA-C  Any Other Special Instructions Will Be Listed Below (If Applicable). none

## 2017-12-19 ENCOUNTER — Ambulatory Visit: Payer: Medicare Other | Admitting: Cardiovascular Disease

## 2017-12-31 DIAGNOSIS — Z23 Encounter for immunization: Secondary | ICD-10-CM | POA: Diagnosis not present

## 2018-02-19 DIAGNOSIS — R7301 Impaired fasting glucose: Secondary | ICD-10-CM | POA: Diagnosis not present

## 2018-02-19 DIAGNOSIS — M25552 Pain in left hip: Secondary | ICD-10-CM | POA: Diagnosis not present

## 2018-02-19 DIAGNOSIS — M1990 Unspecified osteoarthritis, unspecified site: Secondary | ICD-10-CM | POA: Diagnosis not present

## 2018-02-19 DIAGNOSIS — R413 Other amnesia: Secondary | ICD-10-CM | POA: Diagnosis not present

## 2018-02-19 DIAGNOSIS — Z1389 Encounter for screening for other disorder: Secondary | ICD-10-CM | POA: Diagnosis not present

## 2018-02-19 DIAGNOSIS — I10 Essential (primary) hypertension: Secondary | ICD-10-CM | POA: Diagnosis not present

## 2018-02-19 DIAGNOSIS — N401 Enlarged prostate with lower urinary tract symptoms: Secondary | ICD-10-CM | POA: Diagnosis not present

## 2018-02-19 DIAGNOSIS — N182 Chronic kidney disease, stage 2 (mild): Secondary | ICD-10-CM | POA: Diagnosis not present

## 2018-02-19 DIAGNOSIS — I313 Pericardial effusion (noninflammatory): Secondary | ICD-10-CM | POA: Diagnosis not present

## 2018-02-19 DIAGNOSIS — R2681 Unsteadiness on feet: Secondary | ICD-10-CM | POA: Diagnosis not present

## 2018-02-19 DIAGNOSIS — I129 Hypertensive chronic kidney disease with stage 1 through stage 4 chronic kidney disease, or unspecified chronic kidney disease: Secondary | ICD-10-CM | POA: Diagnosis not present

## 2018-02-19 DIAGNOSIS — I872 Venous insufficiency (chronic) (peripheral): Secondary | ICD-10-CM | POA: Diagnosis not present

## 2018-06-18 ENCOUNTER — Telehealth: Payer: Self-pay | Admitting: Cardiovascular Disease

## 2018-06-18 NOTE — Telephone Encounter (Signed)
Sent My-Chart message

## 2018-09-18 DIAGNOSIS — H524 Presbyopia: Secondary | ICD-10-CM | POA: Diagnosis not present

## 2018-09-18 DIAGNOSIS — H02054 Trichiasis without entropian left upper eyelid: Secondary | ICD-10-CM | POA: Diagnosis not present

## 2018-09-18 DIAGNOSIS — H02051 Trichiasis without entropian right upper eyelid: Secondary | ICD-10-CM | POA: Diagnosis not present

## 2018-09-18 DIAGNOSIS — Z961 Presence of intraocular lens: Secondary | ICD-10-CM | POA: Diagnosis not present

## 2018-10-08 DIAGNOSIS — I10 Essential (primary) hypertension: Secondary | ICD-10-CM | POA: Diagnosis not present

## 2018-10-08 DIAGNOSIS — R7301 Impaired fasting glucose: Secondary | ICD-10-CM | POA: Diagnosis not present

## 2018-10-08 DIAGNOSIS — Z125 Encounter for screening for malignant neoplasm of prostate: Secondary | ICD-10-CM | POA: Diagnosis not present

## 2018-10-09 DIAGNOSIS — R82998 Other abnormal findings in urine: Secondary | ICD-10-CM | POA: Diagnosis not present

## 2018-10-09 DIAGNOSIS — I10 Essential (primary) hypertension: Secondary | ICD-10-CM | POA: Diagnosis not present

## 2018-10-24 DIAGNOSIS — I129 Hypertensive chronic kidney disease with stage 1 through stage 4 chronic kidney disease, or unspecified chronic kidney disease: Secondary | ICD-10-CM | POA: Diagnosis not present

## 2018-10-24 DIAGNOSIS — I313 Pericardial effusion (noninflammatory): Secondary | ICD-10-CM | POA: Diagnosis not present

## 2018-10-24 DIAGNOSIS — N401 Enlarged prostate with lower urinary tract symptoms: Secondary | ICD-10-CM | POA: Diagnosis not present

## 2018-10-24 DIAGNOSIS — R2681 Unsteadiness on feet: Secondary | ICD-10-CM | POA: Diagnosis not present

## 2018-10-24 DIAGNOSIS — E669 Obesity, unspecified: Secondary | ICD-10-CM | POA: Diagnosis not present

## 2018-10-24 DIAGNOSIS — R7301 Impaired fasting glucose: Secondary | ICD-10-CM | POA: Diagnosis not present

## 2018-10-24 DIAGNOSIS — M1990 Unspecified osteoarthritis, unspecified site: Secondary | ICD-10-CM | POA: Diagnosis not present

## 2018-10-24 DIAGNOSIS — Z0000 Encounter for general adult medical examination without abnormal findings: Secondary | ICD-10-CM | POA: Diagnosis not present

## 2018-10-24 DIAGNOSIS — R413 Other amnesia: Secondary | ICD-10-CM | POA: Diagnosis not present

## 2018-10-24 DIAGNOSIS — I872 Venous insufficiency (chronic) (peripheral): Secondary | ICD-10-CM | POA: Diagnosis not present

## 2018-10-24 DIAGNOSIS — R609 Edema, unspecified: Secondary | ICD-10-CM | POA: Diagnosis not present

## 2018-10-24 DIAGNOSIS — N183 Chronic kidney disease, stage 3 (moderate): Secondary | ICD-10-CM | POA: Diagnosis not present

## 2018-10-29 DIAGNOSIS — Z1212 Encounter for screening for malignant neoplasm of rectum: Secondary | ICD-10-CM | POA: Diagnosis not present

## 2018-12-23 ENCOUNTER — Telehealth: Payer: Self-pay | Admitting: Cardiovascular Disease

## 2018-12-23 NOTE — Telephone Encounter (Signed)
Called patient and spoke to daughter- they want to do in office visit and not virtual visit.  Will made MD aware.

## 2018-12-23 NOTE — Telephone Encounter (Signed)
Please advise if okay, has hearing loss-  Will route to MD.

## 2018-12-23 NOTE — Telephone Encounter (Signed)
I think he is very well suited for a virtual visit

## 2018-12-23 NOTE — Telephone Encounter (Signed)
Patient's daughter Olegario Shearer called stating she needs to come with her father to his appt on 11/12. He has serve hearing loss, and he is also in a transport chair.

## 2018-12-25 ENCOUNTER — Other Ambulatory Visit: Payer: Self-pay

## 2018-12-25 ENCOUNTER — Encounter: Payer: Self-pay | Admitting: Cardiovascular Disease

## 2018-12-25 ENCOUNTER — Ambulatory Visit (INDEPENDENT_AMBULATORY_CARE_PROVIDER_SITE_OTHER): Payer: Medicare Other | Admitting: Cardiovascular Disease

## 2018-12-25 VITALS — BP 116/70 | HR 71 | Temp 97.2°F | Ht 75.0 in | Wt 208.6 lb

## 2018-12-25 DIAGNOSIS — I441 Atrioventricular block, second degree: Secondary | ICD-10-CM

## 2018-12-25 DIAGNOSIS — I313 Pericardial effusion (noninflammatory): Secondary | ICD-10-CM

## 2018-12-25 DIAGNOSIS — I483 Typical atrial flutter: Secondary | ICD-10-CM

## 2018-12-25 DIAGNOSIS — I493 Ventricular premature depolarization: Secondary | ICD-10-CM | POA: Diagnosis not present

## 2018-12-25 DIAGNOSIS — I10 Essential (primary) hypertension: Secondary | ICD-10-CM

## 2018-12-25 DIAGNOSIS — I3139 Other pericardial effusion (noninflammatory): Secondary | ICD-10-CM

## 2018-12-25 NOTE — Patient Instructions (Signed)
Medication Instructions:  STOP the Lisinopril  *If you need a refill on your cardiac medications before your next appointment, please call your pharmacy*  Lab Work: None ordered If you have labs (blood work) drawn today and your tests are completely normal, you will receive your results only by: Marland Kitchen MyChart Message (if you have MyChart) OR . A paper copy in the mail If you have any lab test that is abnormal or we need to change your treatment, we will call you to review the results.  Testing/Procedures: None ordered  Follow-Up: At [facility], you and your health needs are our priority.  As part of our continuing mission to provide you with exceptional heart care, we have created designated Provider Care Teams.  These Care Teams include your primary Cardiologist (physician) and Advanced Practice Providers (APPs -  Physician Assistants and Nurse Practitioners) who all work together to provide you with the care you need, when you need it.  Your next appointment:   12 months  The format for your next appointment:   In Person  Provider:   Sanda Klein, MD

## 2018-12-25 NOTE — Progress Notes (Signed)
Cardiology Office Note    Date:  12/27/2018   ID:  Terry Gregory, DOB Jul 03, 1921, MRN [id number]  PCP:  Burnard Bunting, MD  Cardiologist:   Sanda Klein, MD   Chief complaint: Follow-up idiopathic pericardial effusion, hypertension, cardiac conduction system disease.  History of Present Illness:  Terry Gregory is a 85 y.o. male returning in follow-up for hypertension and cardiac conduction system disease.  He also has a history of idiopathic pericardial effusion in 2014-2016 that resolved spontaneously.  On October 23 he felt very weak and tremulous and had a temperature to 101 F and hypotension 79/49.  The fever and the symptoms resolved spontaneously in 24 hours and he has been well since.  He did not have any respiratory symptoms or diarrhea.  Nobody else was ill in the household. The patient specifically denies any chest pain at rest exertion, dyspnea at rest or with exertion, orthopnea, paroxysmal nocturnal dyspnea, syncope, palpitations, focal neurological deficits, intermittent claudication, lower extremity edema, unexplained weight gain, cough, hemoptysis or wheezing.  His daughter has been keeping a very detailed log of his blood pressure, heart rate, sometimes 2 or 3 times a day.  His blood pressure is consistently in the 110-120/65-75 range.  His heart rate is usually in the 70s, with occasional readings in the 40s.  He does not feel unwell when the heart rate is slow.  He had a single event of documented atrial arrhythmia, atrial flutter with 4: 1 AV conduction in June 2017, recurrence not seen on a subsequent event monitor. In view of his advanced age, low prevalence of the arrhythmia, absence of history of neurological events, rather unsteady gait and personal preference, anticoagulants were not prescribed.  Long-standing AV conduction abnormalities with at least one previous episode of Mobitz type I second-degree AV block, otherwise with very long first-degree AV block, right  bundle branch block.  He has never describe syncope or symptoms of bradycardia.  Today he presents in sinus rhythm with ventricular bigeminy.  The ECG rate is 71 bpm but by peripheral pulse his heart rate is 36.  He is unaware of the arrhythmia does not have symptoms of bradycardia.  The sinus beats are conducted with a very long first-degree AV block 336 ms.  The PVCs have a RBBB + LAFB morphology suggestive origin close to the left posterior fascicle.  The ECG is otherwise normal.  Past Medical History:  Diagnosis Date  . BPH (benign prostatic hyperplasia)   . Diverticulosis   . Pericardial effusion     Past Surgical History:  Procedure Laterality Date  . APPENDECTOMY  1960  . CATARACT EXTRACTION  2009   both eyes  . CHOLECYSTECTOMY  2000  . INGUINAL HERNIA REPAIR  1980's  . TOTAL HIP ARTHROPLASTY  2003   right  . WRIST SURGERY  2009   left    Current Medications: Outpatient Medications Prior to Visit  Medication Sig Dispense Refill  . acetic acid-hydrocortisone (VOSOL-HC) otic solution Place 5 drops into both ears See admin instructions. Instill 5 drops into each ear for 5 days ( end of each month)    . aspirin EC 81 MG tablet Take 81 mg by mouth daily.    . finasteride (PROSCAR) 5 MG tablet Take 1 tablet by mouth daily.    Marland Kitchen lisinopril (PRINIVIL,ZESTRIL) 10 MG tablet TAKE 1 TABLET EACH DAY. 30 tablet 0   No facility-administered medications prior to visit.      Allergies:   Erythromycin and Penicillins  Social History   Socioeconomic History  . Marital status: Widowed    Spouse name: Not on file  . Number of children: Not on file  . Years of education: Not on file  . Highest education level: Not on file  Occupational History  . Not on file  Social Needs  . Financial resource strain: Not on file  . Food insecurity    Worry: Not on file    Inability: Not on file  . Transportation needs    Medical: Not on file    Non-medical: Not on file  Tobacco Use  .  Smoking status: Never Smoker  . Smokeless tobacco: Never Used  Substance and Sexual Activity  . Alcohol use: No  . Drug use: No  . Sexual activity: Not on file  Lifestyle  . Physical activity    Days per week: Not on file    Minutes per session: Not on file  . Stress: Not on file  Relationships  . Social Herbalist on phone: Not on file    Gets together: Not on file    Attends religious service: Not on file    Active member of club or organization: Not on file    Attends meetings of clubs or organizations: Not on file    Relationship status: Not on file  Other Topics Concern  . Not on file  Social History Narrative  . Not on file     Family History:  The patient's family history includes Cancer in his sister; Diabetes in his sister; Heart attack in his father; Heart failure in his brother; Stroke in his brother and mother.   ROS:   Please see the history of present illness.    ROS All other systems are reviewed and are negative.   PHYSICAL EXAM:   VS:  BP 116/70   Pulse 71   Temp (!) 97.2 F (36.2 C)   Ht 6\' 3"  (1.905 m)   Wt 208 lb 9.6 oz (94.6 kg)   SpO2 95%   BMI 26.07 kg/m      General: Alert, oriented x3, no distress, appears well and younger than stated age Head: no evidence of trauma, PERRL, EOMI, no exophtalmos or lid lag, no myxedema, no xanthelasma; normal ears, nose and oropharynx Neck: normal jugular venous pulsations and no hepatojugular reflux; brisk carotid pulses without delay and no carotid bruits Chest: clear to auscultation, no signs of consolidation by percussion or palpation, normal fremitus, symmetrical and full respiratory excursions Cardiovascular: normal position and quality of the apical impulse, regular rhythm with frequent ectopy, normal first and second heart sounds, no murmurs, rubs or gallops Abdomen: no tenderness or distention, no masses by palpation, no abnormal pulsatility or arterial bruits, normal bowel sounds, no  hepatosplenomegaly Extremities: no clubbing, cyanosis or edema; 2+ radial, ulnar and brachial pulses bilaterally; 2+ right femoral, posterior tibial and dorsalis pedis pulses; 2+ left femoral, posterior tibial and dorsalis pedis pulses; no subclavian or femoral bruits Neurological: grossly nonfocal Psych: Normal mood and affect   Wt Readings from Last 3 Encounters:  12/25/18 208 lb 9.6 oz (94.6 kg)  12/18/17 208 lb (94.3 kg)  11/02/16 209 lb (94.8 kg)      Studies/Labs Reviewed:   EKG:  EKG is ordered today.  It shows sinus rhythm with a fairly long first-degree AV block at 336 ms and monomorphic PVCs in a pattern of bigeminy.  Normal repolarization pattern.  ASSESSMENT:    1. Typical atrial flutter (  [place])   2. PVCs (premature ventricular contractions)   3. Essential hypertension   4. Idiopathic pericardial effusion   5. Second degree atrioventricular block     PLAN:  In order of problems listed above:  1. AFlutter: Only one event has ever been documented.  He was spontaneously rate controlled.   He is not on anticoagulation.  Embolic risk is relatively low and probably matches the risk of bleeding in this elderly gentleman. CHADSVasc 3 (age 54, HTN). 2. PVCs: These are asymptomatic.  They are possibly responsible for his episodes of "bradycardia" detected by his home blood pressure monitor.  Beta-blockers and other negative chronotropic agents should be avoided.  3. HTN: His blood pressure is always very well controlled and I suspect he does not need the ACE inhibitor anymore.  Stop lisinopril.  His episode of hypotension 2 weeks ago sounds like it was related to a viral infection and has resolved. 4. Hx peric eff: Despite noninvasive work-up, etiology was never identified.  It resolved spontaneously. 5. Second-degree AV block: He does not have symptoms of bradycardia.  This would justify pacemaker implantation if he should develop symptoms.     Medication Adjustments/Labs and  Tests Ordered: Current medicines are reviewed at length with the patient today.  Concerns regarding medicines are outlined above.  Medication changes, Labs and Tests ordered today are listed in the Patient Instructions below. Patient Instructions  Medication Instructions:  STOP the Lisinopril  *If you need a refill on your cardiac medications before your next appointment, please call your pharmacy*  Lab Work: None ordered If you have labs (blood work) drawn today and your tests are completely normal, you will receive your results only by: Marland Kitchen MyChart Message (if you have MyChart) OR . A paper copy in the mail If you have any lab test that is abnormal or we need to change your treatment, we will call you to review the results.  Testing/Procedures: None ordered  Follow-Up: At [facility], you and your health needs are our priority.  As part of our continuing mission to provide you with exceptional heart care, we have created designated Provider Care Teams.  These Care Teams include your primary Cardiologist (physician) and Advanced Practice Providers (APPs -  Physician Assistants and Nurse Practitioners) who all work together to provide you with the care you need, when you need it.  Your next appointment:   12 months  The format for your next appointment:   In Person  Provider:   Sanda Klein, MD      Signed, Sanda Klein, MD  12/27/2018 10:43 AM    [address], [place], [place]  [address] Phone: [phone]; Fax: [phone]

## 2018-12-27 ENCOUNTER — Encounter: Payer: Self-pay | Admitting: Cardiovascular Disease

## 2019-03-06 ENCOUNTER — Ambulatory Visit: Payer: Medicare Other | Attending: Internal Medicine

## 2019-03-06 DIAGNOSIS — Z23 Encounter for immunization: Secondary | ICD-10-CM

## 2019-03-06 NOTE — Progress Notes (Signed)
   Covid-19 Vaccination Clinic  Name:  Erman Ikner    MRN: [id number] DOB: Aug 18, 1921  03/06/2019  Mr. Lunday was observed post Covid-19 immunization for 15 minutes without incidence. He was provided with Vaccine Information Sheet and instruction to access the V-Safe system.   Mr. Matsui was instructed to call 911 with any severe reactions post vaccine: Marland Kitchen Difficulty breathing  . Swelling of your face and throat  . A fast heartbeat  . A bad rash all over your body  . Dizziness and weakness    Immunizations Administered    Name Date Dose VIS Date Route   Pfizer COVID-19 Vaccine 03/06/2019  1:06 PM 0.3 mL 01/23/2019 Intramuscular   Manufacturer: [place]   Lot: [id number]   [place]: [id number]

## 2019-03-27 ENCOUNTER — Ambulatory Visit: Payer: Medicare Other | Attending: Internal Medicine

## 2019-03-27 DIAGNOSIS — Z23 Encounter for immunization: Secondary | ICD-10-CM | POA: Insufficient documentation

## 2019-03-27 NOTE — Progress Notes (Signed)
   Covid-19 Vaccination Clinic  Name:  Terry Gregory    MRN: [id number] DOB: 10/25/1921  03/27/2019  Mr. Terry Gregory was observed post Covid-19 immunization for 15 minutes without incidence. He was provided with Vaccine Information Sheet and instruction to access the V-Safe system.   Mr. Terry Gregory was instructed to call 911 with any severe reactions post vaccine: Marland Kitchen Difficulty breathing  . Swelling of your face and throat  . A fast heartbeat  . A bad rash all over your body  . Dizziness and weakness    Immunizations Administered    Name Date Dose VIS Date Route   Pfizer COVID-19 Vaccine 03/27/2019 12:33 PM 0.3 mL 01/23/2019 Intramuscular   Manufacturer: [place]   Lot: [id number]   [place]: [id number]

## 2019-09-02 DIAGNOSIS — R1319 Other dysphagia: Secondary | ICD-10-CM | POA: Diagnosis not present

## 2019-09-02 DIAGNOSIS — R143 Flatulence: Secondary | ICD-10-CM | POA: Diagnosis not present

## 2019-09-02 DIAGNOSIS — K5901 Slow transit constipation: Secondary | ICD-10-CM | POA: Diagnosis not present

## 2019-09-22 DIAGNOSIS — H5203 Hypermetropia, bilateral: Secondary | ICD-10-CM | POA: Diagnosis not present

## 2019-09-22 DIAGNOSIS — H35373 Puckering of macula, bilateral: Secondary | ICD-10-CM | POA: Diagnosis not present

## 2019-10-27 DIAGNOSIS — R7301 Impaired fasting glucose: Secondary | ICD-10-CM | POA: Diagnosis not present

## 2019-10-27 DIAGNOSIS — I129 Hypertensive chronic kidney disease with stage 1 through stage 4 chronic kidney disease, or unspecified chronic kidney disease: Secondary | ICD-10-CM | POA: Diagnosis not present

## 2019-10-28 DIAGNOSIS — R2681 Unsteadiness on feet: Secondary | ICD-10-CM | POA: Diagnosis not present

## 2019-10-28 DIAGNOSIS — R82998 Other abnormal findings in urine: Secondary | ICD-10-CM | POA: Diagnosis not present

## 2019-10-28 DIAGNOSIS — I10 Essential (primary) hypertension: Secondary | ICD-10-CM | POA: Diagnosis not present

## 2019-10-28 DIAGNOSIS — Z23 Encounter for immunization: Secondary | ICD-10-CM | POA: Diagnosis not present

## 2019-10-28 DIAGNOSIS — I872 Venous insufficiency (chronic) (peripheral): Secondary | ICD-10-CM | POA: Diagnosis not present

## 2019-10-28 DIAGNOSIS — R7301 Impaired fasting glucose: Secondary | ICD-10-CM | POA: Diagnosis not present

## 2019-10-28 DIAGNOSIS — R413 Other amnesia: Secondary | ICD-10-CM | POA: Diagnosis not present

## 2019-10-28 DIAGNOSIS — Z0000 Encounter for general adult medical examination without abnormal findings: Secondary | ICD-10-CM | POA: Diagnosis not present

## 2019-11-10 DIAGNOSIS — K5901 Slow transit constipation: Secondary | ICD-10-CM | POA: Diagnosis not present

## 2019-11-10 DIAGNOSIS — R143 Flatulence: Secondary | ICD-10-CM | POA: Diagnosis not present

## 2019-11-10 DIAGNOSIS — R1319 Other dysphagia: Secondary | ICD-10-CM | POA: Diagnosis not present

## 2019-12-22 DIAGNOSIS — L820 Inflamed seborrheic keratosis: Secondary | ICD-10-CM | POA: Diagnosis not present

## 2019-12-22 DIAGNOSIS — Z85828 Personal history of other malignant neoplasm of skin: Secondary | ICD-10-CM | POA: Diagnosis not present

## 2019-12-22 DIAGNOSIS — L304 Erythema intertrigo: Secondary | ICD-10-CM | POA: Diagnosis not present

## 2019-12-22 DIAGNOSIS — L738 Other specified follicular disorders: Secondary | ICD-10-CM | POA: Diagnosis not present

## 2019-12-22 DIAGNOSIS — B353 Tinea pedis: Secondary | ICD-10-CM | POA: Diagnosis not present

## 2019-12-22 DIAGNOSIS — L821 Other seborrheic keratosis: Secondary | ICD-10-CM | POA: Diagnosis not present

## 2020-01-06 ENCOUNTER — Ambulatory Visit (INDEPENDENT_AMBULATORY_CARE_PROVIDER_SITE_OTHER): Payer: Medicare Other | Admitting: Cardiovascular Disease

## 2020-01-06 ENCOUNTER — Encounter: Payer: Self-pay | Admitting: Cardiovascular Disease

## 2020-01-06 ENCOUNTER — Other Ambulatory Visit: Payer: Self-pay

## 2020-01-06 VITALS — BP 154/72 | HR 76 | Ht 75.0 in | Wt 206.8 lb

## 2020-01-06 DIAGNOSIS — Z8679 Personal history of other diseases of the circulatory system: Secondary | ICD-10-CM

## 2020-01-06 DIAGNOSIS — I313 Pericardial effusion (noninflammatory): Secondary | ICD-10-CM

## 2020-01-06 DIAGNOSIS — I10 Essential (primary) hypertension: Secondary | ICD-10-CM

## 2020-01-06 DIAGNOSIS — I441 Atrioventricular block, second degree: Secondary | ICD-10-CM

## 2020-01-06 DIAGNOSIS — I493 Ventricular premature depolarization: Secondary | ICD-10-CM

## 2020-01-06 DIAGNOSIS — I3139 Other pericardial effusion (noninflammatory): Secondary | ICD-10-CM

## 2020-01-06 NOTE — Progress Notes (Signed)
Cardiology Office Note    Date:  01/08/2020   ID:  Terry Gregory, DOB [birth year], MRN [id number]  PCP:  Burnard Bunting, MD  Cardiologist:   Sanda Klein, MD   Chief complaint: Follow-up idiopathic pericardial effusion, hypertension, cardiac conduction system disease.  History of Present Illness:  Terry Gregory is a 86 y.o. male returning in follow-up for hypertension and cardiac conduction system disease.  He also has a history of idiopathic pericardial effusion in 2014-2016 that resolved spontaneously.  He had a single episode of atrial flutter with 4: 1 AV conduction in 2017, without clinical or event monitor evidence for recurrence.  He is done quite well and does not have any cardiovascular complaints.  Sometimes his home blood pressure monitor will show unusual readings or marked bradycardia.  He has very frequent PVCs on exam today, also on his ECG.  In the past, he was evaluated for artifactual bradycardia (ECG showed a heart rate of 71 with ventricular bigeminy but the peripheral pulse was only 36).  On a typical day however at home his systolic blood pressure is in the 120s.  He is ECG shows sinus rhythm with a very long first-degree AV block of around 400 ms and the rhythm strip shows frequent PVCs, sometimes ventricular couplets.  Otherwise his tracing is normal.  The QTC is normal at 423 ms.  He had a single event of documented atrial arrhythmia, atrial flutter with 4: 1 AV conduction in June 2017, recurrence not seen on a subsequent event monitor. In view of his advanced age, low prevalence of the arrhythmia, absence of history of neurological events, rather unsteady gait and personal preference, anticoagulants were not prescribed.  Long-standing AV conduction abnormalities with at least one previous episode of Mobitz type I second-degree AV block, otherwise with very long first-degree AV block, right bundle branch block.  He has never describe syncope or symptoms of  bradycardia.  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   Past Medical History:  Diagnosis Date  . BPH (benign prostatic hyperplasia)   . Diverticulosis   . Pericardial effusion     Past Surgical History:  Procedure Laterality Date  . APPENDECTOMY  1960  . CATARACT EXTRACTION  2009   both eyes  . CHOLECYSTECTOMY  2000  . INGUINAL HERNIA REPAIR  1980's  . TOTAL HIP ARTHROPLASTY  2003   right  . WRIST SURGERY  2009   left    Current Medications: Outpatient Medications Prior to Visit  Medication Sig Dispense Refill  . aspirin EC 81 MG tablet Take 81 mg by mouth daily.    . finasteride (PROSCAR) 5 MG tablet Take 1 tablet by mouth daily.    Marland Kitchen acetic acid-hydrocortisone (VOSOL-HC) otic solution Place 5 drops into both ears See admin instructions. Instill 5 drops into each ear for 5 days ( end of each month)     No facility-administered medications prior to visit.     Allergies:   Erythromycin and Penicillins   Social History   Socioeconomic History  . Marital status: Widowed    Spouse name: Not on file  . Number of children: Not on file  . Years of education: Not on file  . Highest education level: Not on file  Occupational History  . Not on file  Tobacco Use  . Smoking status: Never Smoker  . Smokeless tobacco: Never Used  Substance and Sexual Activity  . Alcohol use: No  .  Drug use: No  . Sexual activity: Not on file  Other Topics Concern  . Not on file  Social History Narrative  . Not on file   Social Determinants of Health   Financial Resource Strain:   . Difficulty of Paying Living Expenses: Not on file  Food Insecurity:   . Worried About Charity fundraiser in the Last Year: Not on file  . Ran Out of Food in the Last Year: Not on file  Transportation Needs:   . Lack of Transportation (Medical): Not on file  . Lack of Transportation (Non-Medical): Not on file  Physical Activity:   . Days of  Exercise per Week: Not on file  . Minutes of Exercise per Session: Not on file  Stress:   . Feeling of Stress : Not on file  Social Connections:   . Frequency of Communication with Friends and Family: Not on file  . Frequency of Social Gatherings with Friends and Family: Not on file  . Attends Religious Services: Not on file  . Active Member of Clubs or Organizations: Not on file  . Attends Archivist Meetings: Not on file  . Marital Status: Not on file     Family History:  The patient's family history includes Cancer in his sister; Diabetes in his sister; Heart attack in his father; Heart failure in his brother; Stroke in his brother and mother.   ROS:   Please see the history of present illness.    ROS All other systems are reviewed and are negative.   PHYSICAL EXAM:   VS:  BP (!) 154/72   Pulse 76   Ht 6\' 3"  (1.905 m)   Wt 206 lb 12.8 oz (93.8 kg)   SpO2 99%   BMI 25.85 kg/m      General: Alert, oriented x3, no distress, appears younger than his stated age Head: no evidence of trauma, PERRL, EOMI, no exophtalmos or lid lag, no myxedema, no xanthelasma; normal ears, nose and oropharynx Neck: normal jugular venous pulsations and no hepatojugular reflux; brisk carotid pulses without delay and no carotid bruits Chest: clear to auscultation, no signs of consolidation by percussion or palpation, normal fremitus, symmetrical and full respiratory excursions Cardiovascular: normal position and quality of the apical impulse, regular rhythm baseline with frequent ectopic beats, normal first and second heart sounds, no murmurs, rubs or gallops Abdomen: no tenderness or distention, no masses by palpation, no abnormal pulsatility or arterial bruits, normal bowel sounds, no hepatosplenomegaly Extremities: no clubbing, cyanosis or edema; 2+ radial, ulnar and brachial pulses bilaterally; 2+ right femoral, posterior tibial and dorsalis pedis pulses; 2+ left femoral, posterior tibial  and dorsalis pedis pulses; no subclavian or femoral bruits Neurological: grossly nonfocal Psych: Normal mood and affect    Wt Readings from Last 3 Encounters:  01/06/20 206 lb 12.8 oz (93.8 kg)  12/25/18 208 lb 9.6 oz (94.6 kg)  12/18/17 208 lb (94.3 kg)      Studies/Labs Reviewed:   EKG:  EKG is ordered today.  It shows sinus rhythm with very long first-degree block (380-390 ms), frequent PVCs, otherwise normal tracing.  QTc 423 ms  ASSESSMENT:    1. History of atrial flutter   2. Essential hypertension   3. PVCs (premature ventricular contractions)   4. Pericardial effusion   5. Mobitz type 1 second degree AV block     PLAN:  In order of problems listed above:  1. AFlutter: Only documented once (4 years ago) and spontaneously  rate controlled.   I think it still the right choice not to provide anticoagulation..  Embolic risk is relatively low and probably matches the risk of bleeding in this elderly gentleman. CHADSVasc 3 (age 63, HTN). 2. PVCs: Asymptomatic, often associated with artifactual bradycardia on his blood pressure monitor. 3. HTN: Typical systolic blood pressure at home is around 120s.  No changes to his medications today.  Avoid any agents with negative chronotropic effect since he has evidence of significant AV nodal disease. 4. Hx peric eff: Etiology never identified, resolved spontaneously. 5. Second-degree AV block: He has never had symptoms of bradycardia or syncope.  We can defer pacemaker implantation unless these occur.    Medication Adjustments/Labs and Tests Ordered: Current medicines are reviewed at length with the patient today.  Concerns regarding medicines are outlined above.  Medication changes, Labs and Tests ordered today are listed in the Patient Instructions below. Patient Instructions  Medication Instructions:  No changes *If you need a refill on your cardiac medications before your next appointment, please call your pharmacy*   Lab  Work: None ordered If you have labs (blood work) drawn today and your tests are completely normal, you will receive your results only by: Marland Kitchen MyChart Message (if you have MyChart) OR . A paper copy in the mail If you have any lab test that is abnormal or we need to change your treatment, we will call you to review the results.   Testing/Procedures: None ordered   Follow-Up: At [facility], you and your health needs are our priority.  As part of our continuing mission to provide you with exceptional heart care, we have created designated Provider Care Teams.  These Care Teams include your primary Cardiologist (physician) and Advanced Practice Providers (APPs -  Physician Assistants and Nurse Practitioners) who all work together to provide you with the care you need, when you need it.  We recommend signing up for the patient portal called "MyChart".  Sign up information is provided on this After Visit Summary.  MyChart is used to connect with patients for Virtual Visits (Telemedicine).  Patients are able to view lab/test results, encounter notes, upcoming appointments, etc.  Non-urgent messages can be sent to your provider as well.   To learn more about what you can do with MyChart, go to [email or web address].    Your next appointment:   12 month(s)  The format for your next appointment:   In Person  Provider:   You may see Dr. Sallyanne Kuster or one of the following Advanced Practice Providers on your designated Care Team:    Almyra Deforest, PA-C  Fabian Sharp, [place] or   Roby Lofts, PA-C      Signed, Sanda Klein, MD  01/08/2020 2:41 PM    [address], [place], [place]  [address] Phone: [phone]; Fax: [phone]

## 2020-01-06 NOTE — Patient Instructions (Signed)
Medication Instructions:  No changes *If you need a refill on your cardiac medications before your next appointment, please call your pharmacy*   Lab Work: None ordered If you have labs (blood work) drawn today and your tests are completely normal, you will receive your results only by: MyChart Message (if you have MyChart) OR A paper copy in the mail If you have any lab test that is abnormal or we need to change your treatment, we will call you to review the results.   Testing/Procedures: None ordered   Follow-Up: At [facility], you and your health needs are our priority.  As part of our continuing mission to provide you with exceptional heart care, we have created designated Provider Care Teams.  These Care Teams include your primary Cardiologist (physician) and Advanced Practice Providers (APPs -  Physician Assistants and Nurse Practitioners) who all work together to provide you with the care you need, when you need it.  We recommend signing up for the patient portal called "MyChart".  Sign up information is provided on this After Visit Summary.  MyChart is used to connect with patients for Virtual Visits (Telemedicine).  Patients are able to view lab/test results, encounter notes, upcoming appointments, etc.  Non-urgent messages can be sent to your provider as well.   To learn more about what you can do with MyChart, go to [email or web address].    Your next appointment:   12 month(s)  The format for your next appointment:   In Person  Provider:   You may see Dr. Croitoru or one of the following Advanced Practice Providers on your designated Care Team:   Hao Meng, PA-C Angela Duke, PA-C or  Krista Kroeger, PA-C    

## 2020-01-08 ENCOUNTER — Encounter: Payer: Self-pay | Admitting: Cardiovascular Disease

## 2020-02-03 DIAGNOSIS — Z23 Encounter for immunization: Secondary | ICD-10-CM | POA: Diagnosis not present

## 2020-03-04 ENCOUNTER — Other Ambulatory Visit: Payer: Self-pay

## 2020-03-04 ENCOUNTER — Ambulatory Visit (INDEPENDENT_AMBULATORY_CARE_PROVIDER_SITE_OTHER): Payer: Medicare Other | Admitting: Cardiology

## 2020-03-04 ENCOUNTER — Ambulatory Visit: Payer: Medicare Other

## 2020-03-04 ENCOUNTER — Ambulatory Visit: Payer: 59 | Admitting: Cardiology

## 2020-03-04 ENCOUNTER — Telehealth: Payer: Self-pay | Admitting: Cardiovascular Disease

## 2020-03-04 ENCOUNTER — Encounter: Payer: Self-pay | Admitting: Radiology

## 2020-03-04 ENCOUNTER — Encounter: Payer: Self-pay | Admitting: Cardiology

## 2020-03-04 ENCOUNTER — Ambulatory Visit (INDEPENDENT_AMBULATORY_CARE_PROVIDER_SITE_OTHER): Payer: Medicare Other

## 2020-03-04 VITALS — HR 61 | Ht 75.0 in | Wt 211.0 lb

## 2020-03-04 DIAGNOSIS — I959 Hypotension, unspecified: Secondary | ICD-10-CM

## 2020-03-04 DIAGNOSIS — R630 Anorexia: Secondary | ICD-10-CM | POA: Diagnosis not present

## 2020-03-04 DIAGNOSIS — Z79899 Other long term (current) drug therapy: Secondary | ICD-10-CM | POA: Diagnosis not present

## 2020-03-04 DIAGNOSIS — I493 Ventricular premature depolarization: Secondary | ICD-10-CM

## 2020-03-04 DIAGNOSIS — I441 Atrioventricular block, second degree: Secondary | ICD-10-CM

## 2020-03-04 DIAGNOSIS — R55 Syncope and collapse: Secondary | ICD-10-CM

## 2020-03-04 NOTE — Patient Instructions (Signed)
Medication Instructions:  Your Physician recommend you continue on your current medication as directed.     *If you need a refill on your cardiac medications before your next appointment, please call your pharmacy*   Lab Work: Your physician recommends lab work today ( BMP).  If you have labs (blood work) drawn today and your tests are completely normal, you will receive your results only by: Marland Kitchen MyChart Message (if you have MyChart) OR . A paper copy in the mail If you have any lab test that is abnormal or we need to change your treatment, we will call you to review the results.   Testing/Procedures: Your physician has requested that you have an echocardiogram. Echocardiography is a painless test that uses sound waves to create images of your heart. It provides your doctor with information about the size and shape of your heart and how well your heart's chambers and valves are working. This procedure takes approximately one hour. There are no restrictions for this procedure. [address]  Our physician has recommended that you wear an 14  DAY ZIO-PATCH monitor. The Zio patch cardiac monitor continuously records heart rhythm data for up to 14 days, this is for patients being evaluated for multiple types heart rhythms. For the first 24 hours post application, please avoid getting the Zio monitor wet in the shower or by excessive sweating during exercise. After that, feel free to carry on with regular activities. Keep soaps and lotions away from the ZIO XT Patch.   Someone from our office will call to verify address and mail monitor.    Follow-Up: At [facility], you and your health needs are our priority.  As part of our continuing mission to provide you with exceptional heart care, we have created designated Provider Care Teams.  These Care Teams include your primary Cardiologist (physician) and Advanced Practice Providers (APPs -  Physician Assistants and Nurse  Practitioners) who all work together to provide you with the care you need, when you need it.  We recommend signing up for the patient portal called "MyChart".  Sign up information is provided on this After Visit Summary.  MyChart is used to connect with patients for Virtual Visits (Telemedicine).  Patients are able to view lab/test results, encounter notes, upcoming appointments, etc.  Non-urgent messages can be sent to your provider as well.   To learn more about what you can do with MyChart, go to [email or web address].    Your next appointment:   3-4 week(s)  The format for your next appointment:   In Person  Provider:   Dr. Loletha Grayer or an APP  [place] term monitor-Live Telemetry  Your physician has requested you wear a ZIO patch monitor for 14__ days.  This is a single patch monitor. [employer] supplies one patch monitor per enrollment. Additional stickers are not available.  Please do not apply patch if you will be having a Nuclear Stress Test, Echocardiogram, Cardiac CT, MRI, or Chest Xray during the time frame you would be wearing the monitor. The patch cannot be worn during these tests. You cannot remove and re-apply the ZIO AT patch monitor.   Your ZIO patch monitor will be sent Fed Ex from [employer] directly to your home address. The monitor may also be mailed to a PO BOX if home delivery is not available. It may take 3-5 days to receive your monitor after you have been enrolled.  Once you have received you monitor, please review  enclosed instructions. Your monitor has already been registered assigning a specific monitor serial # to you.   Applying the monitor  Shave hair from upper left chest.  Hold abrader disc by orange tab. Rub abrader in 40 strokes over left upper chest as indicated in your monitor instructions.  Clean area with 4 enclosed alcohol pads. Use all pads to ensure the area is cleaned thoroughly. Let dry.  Apply patch as indicated in monitor instructions.  Patch will be placed under collarbone on left side of chest with arrow pointing upward.  Rub patch adhesive wings for 2 minutes. Remove the white label marked "1". Remove the white label marked "2". Rub patch adhesive wings for 2 additional minutes.  While looking in a mirror, press and release button in center of patch. A small green light will flash 3-4 times. This will be your only indicator the monitor has been turned on.  Do not shower for the first 24 hours. You may shower after the first 24 hours.  Press the button if you feel a symptom. You will hear a small click. Record Date, Time and Symptom in the Patient Log.   Starting the Gateway  In your kit there is a Hydrographic surveyor box the size of a cellphone. This is Airline pilot. It transmits all your recorded data to [facility]. This box must stay within 10 feet of you at all times. Open the box and push the * button. There will be a light that blinks orange and then green a few times. When the light stops blinking, the Gateway is connected to the ZIO patch.  Call [employer] at [phone] to confirm your monitor is transmitting.   Returning your monitor  Remove your patch and place it inside the [place]. In the lower half of the Gateway there is a white bag with prepaid postage on it. Place Gateway in bag and seal. Mail package back to [employer] as soon as possible. Your physician should have your final report approximately 7 days after you have mailed back your monitor.   Call [employer] at [phone] if you have questions regarding your ZIO AT patch monitor. Call them immediately if you see an orange light blinking on your monitor.  If your monitor falls off in less than 4 days contact our Monitor department at [phone]. If your monitor becomes loose or falls off after 4 days call [employer] at [phone] for suggestions on securing your monitor.

## 2020-03-04 NOTE — Progress Notes (Signed)
Enrolled patient for a 14 day Zio AT Monitor to be mailed to patients home.  

## 2020-03-04 NOTE — Telephone Encounter (Signed)
Pt c/o BP issue: STAT if pt c/o blurred vision, one-sided weakness or slurred speech  1. What are your last 5 BP readings? Today 8:30 am 111/61, 9:00 am 96/52  2. Are you having any other symptoms (ex. Dizziness, headache, blurred vision, passed out)? Lightheadedness, head feels funny,   3. What is your BP issue? Patients daughter states his BP has been low and he has been feeling lightheaded. She states he has not mentioned a headache, but that his head feels funny.

## 2020-03-04 NOTE — Progress Notes (Signed)
Cardiology Office Note    Date:  03/04/2020   ID:  Terry Gregory, DOB 04-07-1921, MRN [id number]  PCP:  Burnard Bunting, MD  Cardiologist:   Sanda Klein, MD   Chief complaint: urgent DOD visit for hypotension  History of Present Illness:  Terry Gregory is a 87 y.o. male returning in follow-up for hypertension and cardiac conduction system disease.  He also has a history of idiopathic pericardial effusion in 2014-2016 that resolved spontaneously.  He had a single episode of atrial flutter with 4: 1 AV conduction in 2017, without clinical or event monitor evidence for recurrence.  He is done quite well and does not have any cardiovascular complaints.  Sometimes his home blood pressure monitor will show unusual readings or marked bradycardia.  He has very frequent PVCs on exam today, also on his ECG.  In the past, he was evaluated for artifactual bradycardia (ECG showed a heart rate of 71 with ventricular bigeminy but the peripheral pulse was only 36).  On a typical day however at home his systolic blood pressure is in the 120s.  He is ECG shows sinus rhythm with a very long first-degree AV block of around 400 ms and the rhythm strip shows frequent PVCs, sometimes ventricular couplets.  Otherwise his tracing is normal.  The QTC is normal at 423 ms.  He had a single event of documented atrial arrhythmia, atrial flutter with 4: 1 AV conduction in June 2017, recurrence not seen on a subsequent event monitor. In view of his advanced age, low prevalence of the arrhythmia, absence of history of neurological events, rather unsteady gait and personal preference, anticoagulants were not prescribed.  Long-standing AV conduction abnormalities with at least one previous episode of Mobitz type I second-degree AV block, otherwise with very long first-degree AV block, right bundle branch block.  He has never describe syncope or symptoms of bradycardia.  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   Today: Here with his daughter today. She is concerned as patient has been more sluggish recently. Had an episode 02/23/20 getting out of bed when he turned his head, felt the room moving and felt like he might pass out. He noted that the pattern in the wallpaper was moving.  Had another episode today, didn't note sensation of movement. He was eating breakfast, didn't feel right, felt like his food didn't want to go down. Daughter took his blood pressures at that time:  Noted decreased appetite   Had a strip from [facility] from 02/26/20. Noted to be concerning for afib. On my review, there are sinus beats with clear p waves, then there are brief episodes of ventricular bigeminy. There are some complexes that do not have clear p waves, but it does not appear to be definitive afib.  No fevers, chills, cough, dysuria.   Normal that he needs to take time changing position to avoid lightheadedness.  Brings a log of BP: 02/23/20 93/55 (6:05 A), 87/57 (7:05A), 169/101 (7:18 A), 155/85 (7:22 A), 147/76 (10 A), 138/79 (9P) HR 50-60 noted on 3 occasions  03/04/20 111/61, HR 60 96/52, HR 61 100/68, HR 65  Past Medical History:  Diagnosis Date  . BPH (benign prostatic hyperplasia)   . Diverticulosis   . Pericardial effusion     Past Surgical History:  Procedure Laterality Date  . APPENDECTOMY  1960  . CATARACT EXTRACTION  2009   both eyes  . CHOLECYSTECTOMY  2000  .  INGUINAL HERNIA REPAIR  1980's  . TOTAL HIP ARTHROPLASTY  2003   right  . WRIST SURGERY  2009   left    Current Medications: Outpatient Medications Prior to Visit  Medication Sig Dispense Refill  . aspirin EC 81 MG tablet Take 81 mg by mouth daily.    . finasteride (PROSCAR) 5 MG tablet Take 1 tablet by mouth daily.     No facility-administered medications prior to visit.     Allergies:   Erythromycin and Penicillins   Social History   Tobacco Use  . Smoking status:  Never Smoker  . Smokeless tobacco: Never Used  Substance Use Topics  . Alcohol use: No  . Drug use: No    Family History:  The patient's family history includes Cancer in his sister; Diabetes in his sister; Heart attack in his father; Heart failure in his brother; Stroke in his brother and mother.   ROS:   Please see the history of present illness.    ROS All other systems are reviewed and are negative.   PHYSICAL EXAM:   VS:  Pulse 61   Ht '6\' 3"'  (1.905 m)   Wt 211 lb (95.7 kg)   SpO2 97%   BMI 26.37 kg/m   Orthostatics: Lying 138/76, HR 69 Sitting 110/63, HR 74 Standing 117/69 Standing 3 mins 133/73   GEN: Well nourished, well developed in no acute distress HEENT: Normal, moist mucous membranes NECK: No JVD CARDIAC: regular rhythm, normal S1 and S2, no rubs or gallops. No murmur. VASCULAR: Radial and DP pulses 2+ bilaterally. No carotid bruits RESPIRATORY:  Clear to auscultation without rales, wheezing or rhonchi  ABDOMEN: Soft, non-tender, non-distended MUSCULOSKELETAL:  Ambulates independently SKIN: Warm and dry, no edema NEUROLOGIC:  Alert and oriented x 3. No focal neuro deficits noted. PSYCHIATRIC:  Normal affect   Wt Readings from Last 3 Encounters:  03/04/20 211 lb (95.7 kg)  01/06/20 206 lb 12.8 oz (93.8 kg)  12/25/18 208 lb 9.6 oz (94.6 kg)      Studies/Labs Reviewed:   EKG:  EKG is ordered today and personally reviewed. Shows SR with prolonged AV conduction, 2nd degree type I block, with PAC/PVC, 61 bpm  ASSESSMENT:    1. Hypotension, unspecified hypotension type   2. PVCs (premature ventricular contractions)   3. Loss of appetite   4. Medication management   5. Near syncope   6. Mobitz type 1 second degree AV block     PLAN:   Hypotension, near syncope -in the setting of fatigue, loss of appetite -has known second degree AV block. My concern would be that he now has intermittent higher degree block as a potential cause for his near  syncope -will order live monitor for further evaluation of potential arrhythmias -needs close follow up in 3-4 weeks  Known PVCs: will be further evaluated with monitor  Medication Adjustments/Labs and Tests Ordered: Current medicines are reviewed at length with the patient today.  Concerns regarding medicines are outlined above.  Medication changes, Labs and Tests ordered today are listed in the Patient Instructions below. Patient Instructions  Medication Instructions:  Your Physician recommend you continue on your current medication as directed.     *If you need a refill on your cardiac medications before your next appointment, please call your pharmacy*   Lab Work: Your physician recommends lab work today ( BMP).  If you have labs (blood work) drawn today and your tests are completely normal, you will receive your results only  by: . MyChart Message (if you have MyChart) OR . A paper copy in the mail If you have any lab test that is abnormal or we need to change your treatment, we will call you to review the results.   Testing/Procedures: Your physician has requested that you have an echocardiogram. Echocardiography is a painless test that uses sound waves to create images of your heart. It provides your doctor with information about the size and shape of your heart and how well your heart's chambers and valves are working. This procedure takes approximately one hour. There are no restrictions for this procedure. [place] [address]  Our physician has recommended that you wear an 14  DAY ZIO-PATCH monitor. The Zio patch cardiac monitor continuously records heart rhythm data for up to 14 days, this is for patients being evaluated for multiple types heart rhythms. For the first 24 hours post application, please avoid getting the Zio monitor wet in the shower or by excessive sweating during exercise. After that, feel free to carry on with regular activities. Keep soaps and  lotions away from the ZIO XT Patch.   Someone from our office will call to verify address and mail monitor.    Follow-Up: At [facility], you and your health needs are our priority.  As part of our continuing mission to provide you with exceptional heart care, we have created designated Provider Care Teams.  These Care Teams include your primary Cardiologist (physician) and Advanced Practice Providers (APPs -  Physician Assistants and Nurse Practitioners) who all work together to provide you with the care you need, when you need it.  We recommend signing up for the patient portal called "MyChart".  Sign up information is provided on this After Visit Summary.  MyChart is used to connect with patients for Virtual Visits (Telemedicine).  Patients are able to view lab/test results, encounter notes, upcoming appointments, etc.  Non-urgent messages can be sent to your provider as well.   To learn more about what you can do with MyChart, go to [email or web address].    Your next appointment:   3-4 week(s)  The format for your next appointment:   In Person  Provider:   Dr. Loletha Grayer or an APP  [place] term monitor-Live Telemetry  Your physician has requested you wear a ZIO patch monitor for 14__ days.  This is a single patch monitor. [employer] supplies one patch monitor per enrollment. Additional stickers are not available.  Please do not apply patch if you will be having a Nuclear Stress Test, Echocardiogram, Cardiac CT, MRI, or Chest Xray during the time frame you would be wearing the monitor. The patch cannot be worn during these tests. You cannot remove and re-apply the ZIO AT patch monitor.   Your ZIO patch monitor will be sent Fed Ex from [employer] directly to your home address. The monitor may also be mailed to a PO BOX if home delivery is not available. It may take 3-5 days to receive your monitor after you have been enrolled.  Once you have received you monitor, please review  enclosed instructions. Your monitor has already been registered assigning a specific monitor serial # to you.   Applying the monitor  Shave hair from upper left chest.  Hold abrader disc by orange tab. Rub abrader in 40 strokes over left upper chest as indicated in your monitor instructions.  Clean area with 4 enclosed alcohol pads. Use all pads to ensure the area is cleaned  thoroughly. Let dry.  Apply patch as indicated in monitor instructions. Patch will be placed under collarbone on left side of chest with arrow pointing upward.  Rub patch adhesive wings for 2 minutes. Remove the white label marked "1". Remove the white label marked "2". Rub patch adhesive wings for 2 additional minutes.  While looking in a mirror, press and release button in center of patch. A small green light will flash 3-4 times. This will be your only indicator the monitor has been turned on.  Do not shower for the first 24 hours. You may shower after the first 24 hours.  Press the button if you feel a symptom. You will hear a small click. Record Date, Time and Symptom in the Patient Log.   Starting the Gateway  In your kit there is a Hydrographic surveyor box the size of a cellphone. This is Airline pilot. It transmits all your recorded data to [facility]. This box must stay within 10 feet of you at all times. Open the box and push the * button. There will be a light that blinks orange and then green a few times. When the light stops blinking, the Gateway is connected to the ZIO patch.  Call [employer] at [phone] to confirm your monitor is transmitting.   Returning your monitor  Remove your patch and place it inside the [place]. In the lower half of the Gateway there is a white bag with prepaid postage on it. Place Gateway in bag and seal. Mail package back to [facility] as soon as possible. Your physician should have your final report approximately 7 days after you have mailed back your monitor.   Call [facility] [place] at [phone] if you have questions regarding your ZIO AT patch monitor. Call them immediately if you see an orange light blinking on your monitor.  If your monitor falls off in less than 4 days contact our Monitor department at [phone]. If your monitor becomes loose or falls off after 4 days call [employer] at [phone] for suggestions on securing your monitor.       Signed, Buford Dresser, MD  03/04/2020    [facility]

## 2020-03-04 NOTE — Telephone Encounter (Signed)
Patient's daughter called this morning with report of patient feeling "dizzy, head not feeling right." Denies any extremity weakness, slurred speed, facial drooping etc. Pulse difficult to assess due to irregular rate. Bp today 8:30 am 111/61, 9:00 am 96/52, Patient's daughter reports patient feeling "dizzy, head not feeling right." Denies any extremity weakness, slurred speed, facial drooping etc. Pulse difficult to assess due to irregular rate. Bp today 8:30 am 111/61, 9:00 am 96/52. Kardia device is not picking up a good signal for measurement of heart rhythm.  Patient's daughter states patient is not having any GI symptoms, presyncopal symptoms and has not had a Covid exposure.   States patients po intake has been normal for him.    As per Dr Victorino December recommendation appt scheduled for EKG at 2:00 this afternoon.

## 2020-03-04 NOTE — Telephone Encounter (Signed)
Call placed with the daughter. The patient has been set up with DOD today for an appointment due to symptoms.

## 2020-03-05 LAB — BASIC METABOLIC PANEL
BUN/Creatinine Ratio: 34 — ABNORMAL HIGH (ref 10–24)
BUN: 40 mg/dL — ABNORMAL HIGH (ref 10–36)
CO2: 23 mmol/L (ref 20–29)
Calcium: 8.2 mg/dL — ABNORMAL LOW (ref 8.6–10.2)
Chloride: 102 mmol/L (ref 96–106)
Creatinine, Ser: 1.18 mg/dL (ref 0.76–1.27)
GFR calc Af Amer: 59 mL/min/{1.73_m2} — ABNORMAL LOW (ref >59)
GFR calc non Af Amer: 51 mL/min/{1.73_m2} — ABNORMAL LOW (ref >59)
Glucose: 117 mg/dL — ABNORMAL HIGH (ref 65–99)
Potassium: 4.7 mmol/L (ref 3.5–5.2)
Sodium: 139 mmol/L (ref 134–144)

## 2020-03-11 ENCOUNTER — Ambulatory Visit (HOSPITAL_COMMUNITY): Payer: Medicare Other | Attending: Cardiology

## 2020-03-11 ENCOUNTER — Other Ambulatory Visit: Payer: Self-pay

## 2020-03-11 DIAGNOSIS — I493 Ventricular premature depolarization: Secondary | ICD-10-CM | POA: Insufficient documentation

## 2020-03-11 DIAGNOSIS — I959 Hypotension, unspecified: Secondary | ICD-10-CM | POA: Diagnosis not present

## 2020-03-11 LAB — ECHOCARDIOGRAM COMPLETE
Area-P 1/2: 5.27 cm2
S' Lateral: 2.9 cm

## 2020-03-12 DIAGNOSIS — I493 Ventricular premature depolarization: Secondary | ICD-10-CM

## 2020-03-12 DIAGNOSIS — I959 Hypotension, unspecified: Secondary | ICD-10-CM | POA: Diagnosis not present

## 2020-03-14 ENCOUNTER — Telehealth: Payer: Self-pay | Admitting: Cardiology

## 2020-03-14 NOTE — Telephone Encounter (Signed)
Spoke with patient's daughter. Patient currently eating lunch. Patient had no symptoms at 10:51am this morning when event occured. [employer] called stating patient was in 1st degree heart block. ECG tracing taken to Dr. Debara Pickett, DOD. Dr. Sallyanne Kuster messaged by Dr. Debara Pickett and patient was placed on Dr. Victorino December schedule for 03/16/20 at 8:40am. Daughter aware of appointment.

## 2020-03-16 ENCOUNTER — Other Ambulatory Visit: Payer: Self-pay

## 2020-03-16 ENCOUNTER — Encounter: Payer: Self-pay | Admitting: Cardiovascular Disease

## 2020-03-16 ENCOUNTER — Ambulatory Visit: Payer: Medicare Other | Admitting: Cardiovascular Disease

## 2020-03-16 VITALS — BP 150/70 | HR 72 | Ht 75.0 in | Wt 204.0 lb

## 2020-03-16 DIAGNOSIS — Z8679 Personal history of other diseases of the circulatory system: Secondary | ICD-10-CM

## 2020-03-16 DIAGNOSIS — I493 Ventricular premature depolarization: Secondary | ICD-10-CM | POA: Diagnosis not present

## 2020-03-16 DIAGNOSIS — I441 Atrioventricular block, second degree: Secondary | ICD-10-CM | POA: Diagnosis not present

## 2020-03-16 DIAGNOSIS — I10 Essential (primary) hypertension: Secondary | ICD-10-CM

## 2020-03-16 DIAGNOSIS — I3139 Other pericardial effusion (noninflammatory): Secondary | ICD-10-CM

## 2020-03-16 DIAGNOSIS — I313 Pericardial effusion (noninflammatory): Secondary | ICD-10-CM

## 2020-03-16 LAB — BASIC METABOLIC PANEL
BUN/Creatinine Ratio: 24 (ref 10–24)
BUN: 23 mg/dL (ref 10–36)
CO2: 25 mmol/L (ref 20–29)
Calcium: 9.2 mg/dL (ref 8.6–10.2)
Chloride: 100 mmol/L (ref 96–106)
Creatinine, Ser: 0.95 mg/dL (ref 0.76–1.27)
GFR calc Af Amer: 77 mL/min/{1.73_m2} (ref >59)
GFR calc non Af Amer: 67 mL/min/{1.73_m2} (ref >59)
Glucose: 80 mg/dL (ref 65–99)
Potassium: 5 mmol/L (ref 3.5–5.2)
Sodium: 135 mmol/L (ref 134–144)

## 2020-03-16 LAB — CBC
Hematocrit: 36.8 % — ABNORMAL LOW (ref 37.5–51.0)
Hemoglobin: 12.6 g/dL — ABNORMAL LOW (ref 13.0–17.7)
MCH: 33.3 pg — ABNORMAL HIGH (ref 26.6–33.0)
MCHC: 34.2 g/dL (ref 31.5–35.7)
MCV: 97 fL (ref 79–97)
Platelets: 233 10*3/uL (ref 150–450)
RBC: 3.78 x10E6/uL — ABNORMAL LOW (ref 4.14–5.80)
RDW: 11.8 % (ref 11.6–15.4)
WBC: 6.4 10*3/uL (ref 3.4–10.8)

## 2020-03-16 NOTE — Progress Notes (Signed)
Cardiology Office Note    Date:  03/16/2020   ID:  Terry Gregory, DOB Jun 26, 1921, MRN [id number]  PCP:  Burnard Bunting, MD  Cardiologist:   Sanda Klein, MD   Chief complaint: Follow-up echocardiogram and arrhythmia monitor  History of Present Illness:  Terry Gregory is a 87 y.o. male returning in follow-up for hypertension and cardiac conduction system disease.  He also has a history of idiopathic pericardial effusion in 2014-2016 that resolved spontaneously.  He had a single episode of atrial flutter with 4: 1 AV conduction in 2017, without clinical or event monitor evidence for recurrence.  Over the years he has been feeling more sluggish, but this was attributed to his advanced age.  Recently he was found to have hypotension and a couple of periods of near syncope.  One of them occurred without changes in position, simply sitting at the breakfast table.  He had a repeat echocardiogram that shows benign findings with normal left ventricular systolic function, biatrial dilation, no significant valvular disorders and complete resolution of his previous pericardial effusion.  Event monitor showed periods of extreme bradycardia due to second-degree atrioventricular block Mobitz type I (at x2: 1 with a ventricular rate down to 29 bpm).  The QRS is always the same and remains narrow (no evidence of idioventricular rhythm).  His ECG from January 21 shows sinus rhythm with very long AV conduction times and second-degree atrioventricular block Mobitz type I and a single PVC.  He had a single event of documented atrial arrhythmia, atrial flutter with 4: 1 AV conduction in June 2017, recurrence not seen on a subsequent event monitor. In view of his advanced age, low prevalence of the arrhythmia, absence of history of neurological events, rather unsteady gait and personal preference, anticoagulants were not prescribed.  Long-standing AV conduction abnormalities with at least one previous episode of Mobitz  type I second-degree AV block, otherwise with very long first-degree AV block, right bundle branch block.  He has never describe syncope or symptoms of bradycardia.  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   Past Medical History:  Diagnosis Date  . BPH (benign prostatic hyperplasia)   . Diverticulosis   . Pericardial effusion     Past Surgical History:  Procedure Laterality Date  . APPENDECTOMY  1960  . CATARACT EXTRACTION  2009   both eyes  . CHOLECYSTECTOMY  2000  . INGUINAL HERNIA REPAIR  1980's  . TOTAL HIP ARTHROPLASTY  2003   right  . WRIST SURGERY  2009   left    Current Medications: Outpatient Medications Prior to Visit  Medication Sig Dispense Refill  . aspirin EC 81 MG tablet Take 81 mg by mouth daily.    . finasteride (PROSCAR) 5 MG tablet Take 1 tablet by mouth daily.     No facility-administered medications prior to visit.     Allergies:   Erythromycin and Penicillins   Social History   Socioeconomic History  . Marital status: Widowed    Spouse name: Not on file  . Number of children: Not on file  . Years of education: Not on file  . Highest education level: Not on file  Occupational History  . Not on file  Tobacco Use  . Smoking status: Never Smoker  . Smokeless tobacco: Never Used  Substance and Sexual Activity  . Alcohol use: No  . Drug use: No  . Sexual activity: Not on file  Other Topics Concern  .  Not on file  Social History Narrative  . Not on file   Social Determinants of Health   Financial Resource Strain: Not on file  Food Insecurity: Not on file  Transportation Needs: Not on file  Physical Activity: Not on file  Stress: Not on file  Social Connections: Not on file     Family History:  The patient's family history includes Cancer in his sister; Diabetes in his sister; Heart attack in his father; Heart failure in his brother; Stroke in his brother and mother.   ROS:    Please see the history of present illness.    ROS All other systems are reviewed and are negative.  PHYSICAL EXAM:   VS:  BP (!) 150/70 (BP Location: Right Arm, Patient Position: Sitting)   Pulse 72   Ht 6\' 3"  (1.905 m)   Wt 204 lb (92.5 kg)   SpO2 97%   BMI 25.50 kg/m      General: Alert, oriented x3, no distress Head: no evidence of trauma, PERRL, EOMI, no exophtalmos or lid lag, no myxedema, no xanthelasma; normal ears, nose and oropharynx Neck: normal jugular venous pulsations and no hepatojugular reflux; brisk carotid pulses without delay and no carotid bruits Chest: clear to auscultation, no signs of consolidation by percussion or palpation, normal fremitus, symmetrical and full respiratory excursions Cardiovascular: normal position and quality of the apical impulse, regular rhythm with occasional irregularity, normal first and second heart sounds, no murmurs, rubs or gallops Abdomen: no tenderness or distention, no masses by palpation, no abnormal pulsatility or arterial bruits, normal bowel sounds, no hepatosplenomegaly Extremities: no clubbing, cyanosis or edema; 2+ radial, ulnar and brachial pulses bilaterally; 2+ right femoral, posterior tibial and dorsalis pedis pulses; 2+ left femoral, posterior tibial and dorsalis pedis pulses; no subclavian or femoral bruits Neurological: grossly nonfocal Psych: Normal mood and affect    Wt Readings from Last 3 Encounters:  03/16/20 204 lb (92.5 kg)  03/04/20 211 lb (95.7 kg)  01/06/20 206 lb 12.8 oz (93.8 kg)      Studies/Labs Reviewed:   EKG:  EKG from January 21 personally reviewed.  This shows sinus rhythm with very long AV conduction times and intermittent dropped beats due to second-degree AV block.  A single PVC is also seen.  ASSESSMENT:    1. Mobitz type 1 second degree AV block   2. History of atrial flutter   3. PVCs (premature ventricular contractions)   4. Essential hypertension   5. Pericardial effusion      PLAN:  In order of problems listed above:  1. Second-degree AV block: He has had this for years, but now appears to have near syncope.  Recommend pacemaker implantation.  Discussed this at length with the patient and his daughter.  In particular reviewed the risk of lead dislodgment, need for reoperation, pneumothorax and need for chest tube, perforation and need for lead revision, infection and need for device removal, etc.  This procedure has been fully reviewed with the patient and written informed consent has been obtained. 2. AFlutter: Only documented once (4 years ago) and spontaneously rate controlled.   I think it still the right choice not to provide anticoagulation..  Embolic risk is relatively low and probably matches the risk of bleeding in this elderly gentleman. CHADSVasc 3 (age 53, HTN). 3. PVCs: Always asymptomatic. 4. HTN: Questionable diagnosis.  Mildly elevated today, no adjustment made to his medications. 5. Hx peric eff: Etiology never identified, resolved spontaneously.  Medication Adjustments/Labs and Tests Ordered: Current medicines are reviewed at length with the patient today.  Concerns regarding medicines are outlined above.  Medication changes, Labs and Tests ordered today are listed in the Patient Instructions below. Patient Instructions  Medications: Your physician recommends that you continue on your current medications as directed. Please refer to the Current Medication list given to you today.     * If you need a refill on your cardiac medications before your next appointment, please call your pharmacy. *  Testing/Procedures: Your physician has recommended that you have a pacemaker inserted. A pacemaker is a small device that is placed under the skin of your chest or abdomen to help control abnormal heart rhythms. This device uses electrical pulses to prompt the heart to beat at a normal rate. Pacemakers are used to treat heart rhythms that are too slow.  Wire (leads) are attached to the pacemaker that goes into the chambers of you heart. This is done in the hospital and usually requires and overnight stay. Please follow the instructions below, located under the special instructions section.  Follow-Up: Your physician recommends that you schedule a wound check appointment 10-14 days, after your procedure on 03/21/20, with the device clinic.   Your physician recommends that you schedule a follow up appointment in 91 days, after your procedure on 03/21/20, with Dr. Sallyanne Kuster.  Thank you for choosing [facility]!!      Any Other Special Instructions Will Be Listed Below (If Applicable).     Implantable Device Instructions  You are scheduled for Permanent Transvenous Pacemaker on  03/21/20  with Dr. Sallyanne Kuster.  1.   Please arrive at the [facility], Entrance "A"  at [facility] at  1:30 pm on the day of your procedure. (The address is [address])  2. Do not eat or drink after midnight the night before your procedure.  3.  You will need to have the coronavirus test completed prior to your procedure. An appointment has been made at 2:10 pm on 03/17/20. This is a Drive Up Visit at [id number], [place], [place] [address]. Please tell them that you are there for procedure testing. Stay in your car and someone will be with you shortly. Please make sure to have all other labs completed before this test because you will need to stay quarantined until your procedure.  4.  Medications: Nothing to hold  5.  Plan for an overnight stay.  Bring your insurance cards and a list of you medications.  6.  Wash your chest and neck with surgical scrub the evening before and the morning of your procedure.  Rinse well. Please review the surgical scrub instruction sheet given to you. You may pick this up at the [facility] office or any drug store.  7. Your chest will need to be shaved prior to this procedure (if needed). We ask that you do  this yourself at home 1 to 2 days before or if uncomfortable/unable to do yourself, then it will be performed by the hospital staff the day of.                                                                                                                *  If you have ant questions after you get home, please call Lattie Haw, RN at [phone]  * Every attempt is made to prevent procedures from being rescheduled.  Due to the nature of  Electrophysiology, rescheduling can happen.  The physician is always aware and directs the staff when this occurs.     Pacemaker Implantation, Adult Pacemaker implantation is a procedure to place a pacemaker inside your chest. A pacemaker is a small computer that sends electrical signals to the heart and helps your heart beat normally. A pacemaker also stores information about your heart rhythms. You may need pacemaker implantation if you:  Have a slow heartbeat (bradycardia).  Faint (syncope).  Have shortness of breath (dyspnea) due to heart problems.  The pacemaker attaches to your heart through a wire, called a lead. Sometimes just one lead is needed. Other times, there will be two leads. There are two types of pacemakers:  Transvenous pacemaker. This type is placed under the skin or muscle of your chest. The lead goes through a vein in the chest area to reach the inside of the heart.  Epicardial pacemaker. This type is placed under the skin or muscle of your chest or belly. The lead goes through your chest to the outside of the heart.  Tell a health care provider about:  Any allergies you have.  All medicines you are taking, including vitamins, herbs, eye drops, creams, and over-the-counter medicines.  Any problems you or family members have had with anesthetic medicines.  Any blood or bone disorders you have.  Any surgeries you have had.  Any medical conditions you have.  Whether you are pregnant or may be pregnant. What are the  risks? Generally, this is a safe procedure. However, problems may occur, including:  Infection.  Bleeding.  Failure of the pacemaker or the lead.  Collapse of a lung or bleeding into a lung.  Blood clot inside a blood vessel with a lead.  Damage to the heart.  Infection inside the heart (endocarditis).  Allergic reactions to medicines.  What happens before the procedure? Staying hydrated Follow instructions from your health care provider about hydration, which may include:  Up to 2 hours before the procedure - you may continue to drink clear liquids, such as water, clear fruit juice, black coffee, and plain tea.  Eating and drinking restrictions Follow instructions from your health care provider about eating and drinking, which may include:  8 hours before the procedure - stop eating heavy meals or foods such as meat, fried foods, or fatty foods.  6 hours before the procedure - stop eating light meals or foods, such as toast or cereal.  6 hours before the procedure - stop drinking milk or drinks that contain milk.  2 hours before the procedure - stop drinking clear liquids.  Medicines  Ask your health care provider about: ? Changing or stopping your regular medicines. This is especially important if you are taking diabetes medicines or blood thinners. ? Taking medicines such as aspirin and ibuprofen. These medicines can thin your blood. Do not take these medicines before your procedure if your health care provider instructs you not to.  You may be given antibiotic medicine to help prevent infection. General instructions  You will have a heart evaluation. This may include an electrocardiogram (ECG), chest X-ray, and heart imaging (echocardiogram,  or echo) tests.  You will have blood tests.  Do not use any products that contain nicotine or tobacco, such as cigarettes and e-cigarettes. If you  need help quitting, ask your health care provider.  Plan to have someone  take you home from the hospital or clinic.  If you will be going home right after the procedure, plan to have someone with you for 24 hours.  Ask your health care provider how your surgical site will be marked or identified. What happens during the procedure?  To reduce your risk of infection: ? Your health care team will wash or sanitize their hands. ? Your skin will be washed with soap. ? Hair may be removed from the surgical area.  An IV tube will be inserted into one of your veins.  You will be given one or more of the following: ? A medicine to help you relax (sedative). ? A medicine to numb the area (local anesthetic). ? A medicine to make you fall asleep (general anesthetic).  If you are getting a transvenous pacemaker: ? An incision will be made in your upper chest. ? A pocket will be made for the pacemaker. It may be placed under the skin or between layers of muscle. ? The lead will be inserted into a blood vessel that returns to the heart. ? While X-rays are taken by an imaging machine (fluoroscopy), the lead will be advanced through the vein to the inside of your heart. ? The other end of the lead will be tunneled under the skin and attached to the pacemaker.  If you are getting an epicardial pacemaker: ? An incision will be made near your ribs or breastbone (sternum) for the lead. ? The lead will be attached to the outside of your heart. ? Another incision will be made in your chest or upper belly to create a pocket for the pacemaker. ? The free end of the lead will be tunneled under the skin and attached to the pacemaker.  The transvenous or epicardial pacemaker will be tested. Imaging studies may be done to check the lead position.  The incisions will be closed with stitches (sutures), adhesive strips, or skin glue.  Bandages (dressing) will be placed over the incisions. The procedure may vary among health care providers and hospitals. What happens after the  procedure?  Your blood pressure, heart rate, breathing rate, and blood oxygen level will be monitored until the medicines you were given have worn off.  You will be given antibiotics and pain medicine.  ECG and chest x-rays will be done.  You will wear a continuous type of ECG (Holter monitor) to check your heart rhythm.  Your health care provider will program the pacemaker.  Do not drive for 24 hours if you received a sedative. This information is not intended to replace advice given to you by your health care provider. Make sure you discuss any questions you have with your health care provider. Document Released: 01/19/2002 Document Revised: 08/19/2015 Document Reviewed: 07/13/2015 Elsevier Interactive Patient Education  2018 [employer].     Pacemaker Implantation, Adult, Care After This sheet gives you information about how to care for yourself after your procedure. Your health care provider may also give you more specific instructions. If you have problems or questions, contact your health care provider. What can I expect after the procedure? After the procedure, it is common to have:  Mild pain.  Slight bruising.  Some swelling over the incision.  A slight bump over the skin where the device was placed. Sometimes, it is possible to feel the device under the skin. This is normal.  Follow these instructions at  home: Medicines  Take over-the-counter and prescription medicines only as told by your health care provider.  If you were prescribed an antibiotic medicine, take it as told by your health care provider. Do not stop taking the antibiotic even if you start to feel better. Wound care  Do not remove the bandage on your chest until directed to do so by your health care provider.  After your bandage is removed, you may see pieces of tape called skin adhesive strips over the area where the cut was made (incision site). Let them fall off on their own.  Check the  incision site every day to make sure it is not infected, bleeding, or starting to pull apart.  Do not use lotions or ointments near the incision site unless directed to do so.  Keep the incision area clean and dry for 2-3 days after the procedure or as directed by your health care provider. It takes several weeks for the incision site to completely heal.  Do not take baths, swim, or use a hot tub for 7-10 days or as otherwise directed by your health care provider. Activity  Do not drive or use heavy machinery while taking prescription pain medicine.  Do not drive for 24 hours if you were given a medicine to help you relax (sedative).  Check with your health care provider before you start to drive or play sports.  Avoid sudden jerking, pulling, or chopping movements that pull your upper arm far away from your body. Avoid these movements for at least 6 weeks or as long as told by your health care provider.  Do not lift your upper arm above your shoulders for at least 6 weeks or as long as told by your health care provider. This means no tennis, golf, or swimming.  You may go back to work when your health care provider says it is okay. Pacemaker care  You may be shown how to transfer data from your pacemaker through the phone to your health care provider.  Always let all health care providers know about your pacemaker before you have any medical procedures or tests.  Wear a medical ID bracelet or necklace stating that you have a pacemaker. Carry a pacemaker ID card with you at all times.  Your pacemaker battery will last for 5-15 years. Routine checks by your health care provider will let the health care provider know when the battery is starting to run down. The pacemaker will need to be replaced when the battery starts to run down.  Do not use amateur Chief of Staff. Other electrical devices are safe to use, including power tools, lawn mowers, and speakers.  If you are unsure of whether something is safe to use, ask your health care provider.  When using your cell phone, hold it to the ear opposite the pacemaker. Do not leave your cell phone in a pocket over the pacemaker.  Avoid places or objects that have a strong electric or magnetic field, including: ? Airport Herbalist. When at the airport, let officials know that you have a pacemaker. ? Power plants. ? Large electrical generators. ? Radiofrequency transmission towers, such as cell phone and radio towers. General instructions  Weigh yourself every day. If you suddenly gain weight, fluid may be building up in your body.  Keep all follow-up visits as told by your health care provider. This is important. Contact a health care provider if:  You gain weight suddenly.  Your legs or  feet swell.  It feels like your heart is fluttering or skipping beats (heart palpitations).  You have chills or a fever.  You have more redness, swelling, or pain around your incisions.  You have more fluid or blood coming from your incisions.  Your incisions feel warm to the touch.  You have pus or a bad smell coming from your incisions. Get help right away if:  You have chest pain.  You have trouble breathing or are short of breath.  You become extremely tired.  You are light-headed or you faint. This information is not intended to replace advice given to you by your health care provider. Make sure you discuss any questions you have with your health care provider. Document Released: 08/18/2004 Document Revised: 11/11/2015 Document Reviewed: 11/11/2015 Elsevier Interactive Patient Education  2018 [employer] Discharge Instructions for  Pacemaker/Defibrillator Patients  ACTIVITY No heavy lifting or vigorous activity with your left/right arm for 6 to 8 weeks.  Do not raise your left/right arm above your head for one week.  Gradually raise your affected arm as drawn  below.           __  NO DRIVING for     ; you may begin driving on     .  WOUND CARE - Keep the wound area clean and dry.  Do not get this area wet for one week. No showers for one week; you may shower on     . - The tape/steri-strips on your wound will fall off; do not pull them off.  No bandage is needed on the site.  DO  NOT apply any creams, oils, or ointments to the wound area. - If you notice any drainage or discharge from the wound, any swelling or bruising at the site, or you develop a fever > 101? F after you are discharged home, call the office at once.  SPECIAL INSTRUCTIONS - You are still able to use cellular telephones; use the ear opposite the side where you have your pacemaker/defibrillator.  Avoid carrying your cellular phone near your device. - When traveling through airports, show security personnel your identification card to avoid being screened in the metal detectors.  Ask the security personnel to use the hand wand. - Avoid arc welding equipment, MRI testing (magnetic resonance imaging), TENS units (transcutaneous nerve stimulators).  Call the office for questions about other devices. - Avoid electrical appliances that are in poor condition or are not properly grounded. - Microwave ovens are safe to be near or to operate.  ADDITIONAL INFORMATION FOR DEFIBRILLATOR PATIENTS SHOULD YOUR DEVICE GO OFF: - If your device goes off ONCE and you feel fine afterward, notify the device clinic nurses. - If your device goes off ONCE and you do not feel well afterward, call 911. - If your device goes off TWICE, call 911. - If your device goes off [place], call 911.  DO NOT DRIVE YOURSELF OR A FAMILY MEMBER WITH A DEFIBRILLATOR TO THE HOSPITAL-CALL 911.    [place] - Preparing For Surgery  Before surgery, you can play an important role. Because skin is not sterile, your skin needs to be as free of germs as possible. You can reduce the number of germs on your  skin by washing with CHG (chlorahexidine gluconate) Soap before surgery.  CHG is an antiseptic cleaner which kills germs and bonds with the skin to continue killing germs even after washing.   Please do  not use if you have an allergy to CHG or antibacterial soaps.  If your skin becomes reddened/irritated stop using the CHG.   Do not shave (including legs and underarms) for at least 48 hours prior to first CHG shower.  It is OK to shave your face.  Please follow these instructions carefully:  1.  Shower the night before surgery and the morning of surgery with CHG.  2.  If you choose to wash your hair, wash your hair first as usual with your normal shampoo.  3.  After you shampoo, rinse your hair and body thoroughly to remove the shampoo.  4.  Use CHG as you would any other liquid soap.  You can apply CHG directly to the skin and wash gently with a clean washcloth. 5.  Apply the CHG Soap to your body ONLY FROM THE NECK DOWN.  Do not use on open wounds or open sores.  Avoid contact with your eyes, ears, mouth and genitals (private parts).  Wash genitals (private parts) with your normal soap.  6.  Wash thoroughly, paying special attention to the area where your surgery will be performed.  7.  Thoroughly rise your body with warm water from the neck down.   8.  DO NOT shower/wash with your normal soap after using and rinsing off the CHG soap.  9.  Pat yourself dry with a clean towel.           10.  Wear clean pajamas.           11.  Place clean sheets on your bed the night of your first shower and do not sleep with pets.  Day of Surgery: Do not apply any deodorants/lotions.  Please wear clean clothes to the hospital/surgery center.        Signed, Sanda Klein, MD  03/16/2020 1:30 PM    [facility] [place], [place], [place]  [address] Phone: [phone]; Fax: [phone]

## 2020-03-16 NOTE — H&P (View-Only) (Signed)
 Cardiology Office Note    Date:  03/16/2020   ID:  Terry Gregory, DOB 12/14/1921, MRN [id number]  PCP:  Aronson, Richard, MD  Cardiologist:   Dustine Bertini, MD   Chief complaint: Follow-up echocardiogram and arrhythmia monitor  History of Present Illness:  Terry Gregory is a 87 y.o. male returning in follow-up for hypertension and cardiac conduction system disease.  He also has a history of idiopathic pericardial effusion in 2014-2016 that resolved spontaneously.  He had a single episode of atrial flutter with 4: 1 AV conduction in 2017, without clinical or event monitor evidence for recurrence.  Over the years he has been feeling more sluggish, but this was attributed to his advanced age.  Recently he was found to have hypotension and a couple of periods of near syncope.  One of them occurred without changes in position, simply sitting at the breakfast table.  He had a repeat echocardiogram that shows benign findings with normal left ventricular systolic function, biatrial dilation, no significant valvular disorders and complete resolution of his previous pericardial effusion.  Event monitor showed periods of extreme bradycardia due to second-degree atrioventricular block Mobitz type I (at x2: 1 with a ventricular rate down to 29 bpm).  The QRS is always the same and remains narrow (no evidence of idioventricular rhythm).  His ECG from January 21 shows sinus rhythm with very long AV conduction times and second-degree atrioventricular block Mobitz type I and a single PVC.  He had a single event of documented atrial arrhythmia, atrial flutter with 4: 1 AV conduction in June 2017, recurrence not seen on a subsequent event monitor. In view of his advanced age, low prevalence of the arrhythmia, absence of history of neurological events, rather unsteady gait and personal preference, anticoagulants were not prescribed.  Long-standing AV conduction abnormalities with at least one previous episode of Mobitz  type I second-degree AV block, otherwise with very long first-degree AV block, right bundle branch block.  He has never describe syncope or symptoms of bradycardia.  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   Past Medical History:  Diagnosis Date  . BPH (benign prostatic hyperplasia)   . Diverticulosis   . Pericardial effusion     Past Surgical History:  Procedure Laterality Date  . APPENDECTOMY  1960  . CATARACT EXTRACTION  2009   both eyes  . CHOLECYSTECTOMY  2000  . INGUINAL HERNIA REPAIR  1980's  . TOTAL HIP ARTHROPLASTY  2003   right  . WRIST SURGERY  2009   left    Current Medications: Outpatient Medications Prior to Visit  Medication Sig Dispense Refill  . aspirin EC 81 MG tablet Take 81 mg by mouth daily.    . finasteride (PROSCAR) 5 MG tablet Take 1 tablet by mouth daily.     No facility-administered medications prior to visit.     Allergies:   Erythromycin and Penicillins   Social History   Socioeconomic History  . Marital status: Widowed    Spouse name: Not on file  . Number of children: Not on file  . Years of education: Not on file  . Highest education level: Not on file  Occupational History  . Not on file  Tobacco Use  . Smoking status: Never Smoker  . Smokeless tobacco: Never Used  Substance and Sexual Activity  . Alcohol use: No  . Drug use: No  . Sexual activity: Not on file  Other Topics Concern  .   Not on file  Social History Narrative  . Not on file   Social Determinants of Health   Financial Resource Strain: Not on file  Food Insecurity: Not on file  Transportation Needs: Not on file  Physical Activity: Not on file  Stress: Not on file  Social Connections: Not on file     Family History:  The patient's family history includes Cancer in his sister; Diabetes in his sister; Heart attack in his father; Heart failure in his brother; Stroke in his brother and mother.   ROS:    Please see the history of present illness.    ROS All other systems are reviewed and are negative.  PHYSICAL EXAM:   VS:  BP (!) 150/70 (BP Location: Right Arm, Patient Position: Sitting)   Pulse 72   Ht 6\' 3"  (1.905 m)   Wt 204 lb (92.5 kg)   SpO2 97%   BMI 25.50 kg/m      General: Alert, oriented x3, no distress Head: no evidence of trauma, PERRL, EOMI, no exophtalmos or lid lag, no myxedema, no xanthelasma; normal ears, nose and oropharynx Neck: normal jugular venous pulsations and no hepatojugular reflux; brisk carotid pulses without delay and no carotid bruits Chest: clear to auscultation, no signs of consolidation by percussion or palpation, normal fremitus, symmetrical and full respiratory excursions Cardiovascular: normal position and quality of the apical impulse, regular rhythm with occasional irregularity, normal first and second heart sounds, no murmurs, rubs or gallops Abdomen: no tenderness or distention, no masses by palpation, no abnormal pulsatility or arterial bruits, normal bowel sounds, no hepatosplenomegaly Extremities: no clubbing, cyanosis or edema; 2+ radial, ulnar and brachial pulses bilaterally; 2+ right femoral, posterior tibial and dorsalis pedis pulses; 2+ left femoral, posterior tibial and dorsalis pedis pulses; no subclavian or femoral bruits Neurological: grossly nonfocal Psych: Normal mood and affect    Wt Readings from Last 3 Encounters:  03/16/20 204 lb (92.5 kg)  03/04/20 211 lb (95.7 kg)  01/06/20 206 lb 12.8 oz (93.8 kg)      Studies/Labs Reviewed:   EKG:  EKG from January 21 personally reviewed.  This shows sinus rhythm with very long AV conduction times and intermittent dropped beats due to second-degree AV block.  A single PVC is also seen.  ASSESSMENT:    1. Mobitz type 1 second degree AV block   2. History of atrial flutter   3. PVCs (premature ventricular contractions)   4. Essential hypertension   5. Pericardial effusion      PLAN:  In order of problems listed above:  1. Second-degree AV block: He has had this for years, but now appears to have near syncope.  Recommend pacemaker implantation.  Discussed this at length with the patient and his daughter.  In particular reviewed the risk of lead dislodgment, need for reoperation, pneumothorax and need for chest tube, perforation and need for lead revision, infection and need for device removal, etc.  This procedure has been fully reviewed with the patient and written informed consent has been obtained. 2. AFlutter: Only documented once (4 years ago) and spontaneously rate controlled.   I think it still the right choice not to provide anticoagulation..  Embolic risk is relatively low and probably matches the risk of bleeding in this elderly gentleman. CHADSVasc 3 (age 53, HTN). 3. PVCs: Always asymptomatic. 4. HTN: Questionable diagnosis.  Mildly elevated today, no adjustment made to his medications. 5. Hx peric eff: Etiology never identified, resolved spontaneously.  Medication Adjustments/Labs and Tests Ordered: Current medicines are reviewed at length with the patient today.  Concerns regarding medicines are outlined above.  Medication changes, Labs and Tests ordered today are listed in the Patient Instructions below. Patient Instructions  Medications: Your physician recommends that you continue on your current medications as directed. Please refer to the Current Medication list given to you today.     * If you need a refill on your cardiac medications before your next appointment, please call your pharmacy. *  Testing/Procedures: Your physician has recommended that you have a pacemaker inserted. A pacemaker is a small device that is placed under the skin of your chest or abdomen to help control abnormal heart rhythms. This device uses electrical pulses to prompt the heart to beat at a normal rate. Pacemakers are used to treat heart rhythms that are too slow.  Wire (leads) are attached to the pacemaker that goes into the chambers of you heart. This is done in the hospital and usually requires and overnight stay. Please follow the instructions below, located under the special instructions section.  Follow-Up: Your physician recommends that you schedule a wound check appointment 10-14 days, after your procedure on 03/21/20, with the device clinic.   Your physician recommends that you schedule a follow up appointment in 91 days, after your procedure on 03/21/20, with Dr. Neima Lacross.  Thank you for choosing [facility]!!      Any Other Special Instructions Will Be Listed Below (If Applicable).     Implantable Device Instructions  You are scheduled for Permanent Transvenous Pacemaker on  03/21/20  with Dr. Michaele Amundson.  1.   Please arrive at the [facility] Entrance "A"  at  Hospital at  1:30 pm on the day of your procedure. (The address is [address])  2. Do not eat or drink after midnight the night before your procedure.  3.  You will need to have the coronavirus test completed prior to your procedure. An appointment has been made at 2:10 pm on 03/17/20. This is a Drive Up Visit at [address], [place], [place] [address]. Please tell them that you are there for procedure testing. Stay in your car and someone will be with you shortly. Please make sure to have all other labs completed before this test because you will need to stay quarantined until your procedure.  4.  Medications: Nothing to hold  5.  Plan for an overnight stay.  Bring your insurance cards and a list of you medications.  6.  Wash your chest and neck with surgical scrub the evening before and the morning of your procedure.  Rinse well. Please review the surgical scrub instruction sheet given to you. You may pick this up at the [facility] office or any drug store.  7. Your chest will need to be shaved prior to this procedure (if needed). We ask that you do  this yourself at home 1 to 2 days before or if uncomfortable/unable to do yourself, then it will be performed by the hospital staff the day of.                                                                                                                *   If you have ant questions after you get home, please call Lattie Haw, RN at [phone]  * Every attempt is made to prevent procedures from being rescheduled.  Due to the nature of  Electrophysiology, rescheduling can happen.  The physician is always aware and directs the staff when this occurs.     Pacemaker Implantation, Adult Pacemaker implantation is a procedure to place a pacemaker inside your chest. A pacemaker is a small computer that sends electrical signals to the heart and helps your heart beat normally. A pacemaker also stores information about your heart rhythms. You may need pacemaker implantation if you:  Have a slow heartbeat (bradycardia).  Faint (syncope).  Have shortness of breath (dyspnea) due to heart problems.  The pacemaker attaches to your heart through a wire, called a lead. Sometimes just one lead is needed. Other times, there will be two leads. There are two types of pacemakers:  Transvenous pacemaker. This type is placed under the skin or muscle of your chest. The lead goes through a vein in the chest area to reach the inside of the heart.  Epicardial pacemaker. This type is placed under the skin or muscle of your chest or belly. The lead goes through your chest to the outside of the heart.  Tell a health care provider about:  Any allergies you have.  All medicines you are taking, including vitamins, herbs, eye drops, creams, and over-the-counter medicines.  Any problems you or family members have had with anesthetic medicines.  Any blood or bone disorders you have.  Any surgeries you have had.  Any medical conditions you have.  Whether you are pregnant or may be pregnant. What are the  risks? Generally, this is a safe procedure. However, problems may occur, including:  Infection.  Bleeding.  Failure of the pacemaker or the lead.  Collapse of a lung or bleeding into a lung.  Blood clot inside a blood vessel with a lead.  Damage to the heart.  Infection inside the heart (endocarditis).  Allergic reactions to medicines.  What happens before the procedure? Staying hydrated Follow instructions from your health care provider about hydration, which may include:  Up to 2 hours before the procedure - you may continue to drink clear liquids, such as water, clear fruit juice, black coffee, and plain tea.  Eating and drinking restrictions Follow instructions from your health care provider about eating and drinking, which may include:  8 hours before the procedure - stop eating heavy meals or foods such as meat, fried foods, or fatty foods.  6 hours before the procedure - stop eating light meals or foods, such as toast or cereal.  6 hours before the procedure - stop drinking milk or drinks that contain milk.  2 hours before the procedure - stop drinking clear liquids.  Medicines  Ask your health care provider about: ? Changing or stopping your regular medicines. This is especially important if you are taking diabetes medicines or blood thinners. ? Taking medicines such as aspirin and ibuprofen. These medicines can thin your blood. Do not take these medicines before your procedure if your health care provider instructs you not to.  You may be given antibiotic medicine to help prevent infection. General instructions  You will have a heart evaluation. This may include an electrocardiogram (ECG), chest X-ray, and heart imaging (echocardiogram,  or echo) tests.  You will have blood tests.  Do not use any products that contain nicotine or tobacco, such as cigarettes and e-cigarettes. If you  need help quitting, ask your health care provider.  Plan to have someone  take you home from the hospital or clinic.  If you will be going home right after the procedure, plan to have someone with you for 24 hours.  Ask your health care provider how your surgical site will be marked or identified. What happens during the procedure?  To reduce your risk of infection: ? Your health care team will wash or sanitize their hands. ? Your skin will be washed with soap. ? Hair may be removed from the surgical area.  An IV tube will be inserted into one of your veins.  You will be given one or more of the following: ? A medicine to help you relax (sedative). ? A medicine to numb the area (local anesthetic). ? A medicine to make you fall asleep (general anesthetic).  If you are getting a transvenous pacemaker: ? An incision will be made in your upper chest. ? A pocket will be made for the pacemaker. It may be placed under the skin or between layers of muscle. ? The lead will be inserted into a blood vessel that returns to the heart. ? While X-rays are taken by an imaging machine (fluoroscopy), the lead will be advanced through the vein to the inside of your heart. ? The other end of the lead will be tunneled under the skin and attached to the pacemaker.  If you are getting an epicardial pacemaker: ? An incision will be made near your ribs or breastbone (sternum) for the lead. ? The lead will be attached to the outside of your heart. ? Another incision will be made in your chest or upper belly to create a pocket for the pacemaker. ? The free end of the lead will be tunneled under the skin and attached to the pacemaker.  The transvenous or epicardial pacemaker will be tested. Imaging studies may be done to check the lead position.  The incisions will be closed with stitches (sutures), adhesive strips, or skin glue.  Bandages (dressing) will be placed over the incisions. The procedure may vary among health care providers and hospitals. What happens after the  procedure?  Your blood pressure, heart rate, breathing rate, and blood oxygen level will be monitored until the medicines you were given have worn off.  You will be given antibiotics and pain medicine.  ECG and chest x-rays will be done.  You will wear a continuous type of ECG (Holter monitor) to check your heart rhythm.  Your health care provider will program the pacemaker.  Do not drive for 24 hours if you received a sedative. This information is not intended to replace advice given to you by your health care provider. Make sure you discuss any questions you have with your health care provider. Document Released: 01/19/2002 Document Revised: 08/19/2015 Document Reviewed: 07/13/2015 Elsevier Interactive Patient Education  2018 [employer] Inc[employer]     Pacemaker Implantation, Adult, Care After This sheet gives you information about how to care for yourself after your procedure. Your health care provider may also give you more specific instructions. If you have problems or questions, contact your health care provider. What can I expect after the procedure? After the procedure, it is common to have:  Mild pain.  Slight bruising.  Some swelling over the incision.  A slight bump over the skin where the device was placed. Sometimes, it is possible to feel the device under the skin. This is normal.  Follow these instructions at   home: Medicines  Take over-the-counter and prescription medicines only as told by your health care provider.  If you were prescribed an antibiotic medicine, take it as told by your health care provider. Do not stop taking the antibiotic even if you start to feel better. Wound care  Do not remove the bandage on your chest until directed to do so by your health care provider.  After your bandage is removed, you may see pieces of tape called skin adhesive strips over the area where the cut was made (incision site). Let them fall off on their own.  Check the  incision site every day to make sure it is not infected, bleeding, or starting to pull apart.  Do not use lotions or ointments near the incision site unless directed to do so.  Keep the incision area clean and dry for 2-3 days after the procedure or as directed by your health care provider. It takes several weeks for the incision site to completely heal.  Do not take baths, swim, or use a hot tub for 7-10 days or as otherwise directed by your health care provider. Activity  Do not drive or use heavy machinery while taking prescription pain medicine.  Do not drive for 24 hours if you were given a medicine to help you relax (sedative).  Check with your health care provider before you start to drive or play sports.  Avoid sudden jerking, pulling, or chopping movements that pull your upper arm far away from your body. Avoid these movements for at least 6 weeks or as long as told by your health care provider.  Do not lift your upper arm above your shoulders for at least 6 weeks or as long as told by your health care provider. This means no tennis, golf, or swimming.  You may go back to work when your health care provider says it is okay. Pacemaker care  You may be shown how to transfer data from your pacemaker through the phone to your health care provider.  Always let all health care providers know about your pacemaker before you have any medical procedures or tests.  Wear a medical ID bracelet or necklace stating that you have a pacemaker. Carry a pacemaker ID card with you at all times.  Your pacemaker battery will last for 5-15 years. Routine checks by your health care provider will let the health care provider know when the battery is starting to run down. The pacemaker will need to be replaced when the battery starts to run down.  Do not use amateur radio equipment or electric welding torches. Other electrical devices are safe to use, including power tools, lawn mowers, and speakers.  If you are unsure of whether something is safe to use, ask your health care provider.  When using your cell phone, hold it to the ear opposite the pacemaker. Do not leave your cell phone in a pocket over the pacemaker.  Avoid places or objects that have a strong electric or magnetic field, including: ? Airport security gates. When at the airport, let officials know that you have a pacemaker. ? Power plants. ? Large electrical generators. ? Radiofrequency transmission towers, such as cell phone and radio towers. General instructions  Weigh yourself every day. If you suddenly gain weight, fluid may be building up in your body.  Keep all follow-up visits as told by your health care provider. This is important. Contact a health care provider if:  You gain weight suddenly.  Your legs or   feet swell.  It feels like your heart is fluttering or skipping beats (heart palpitations).  You have chills or a fever.  You have more redness, swelling, or pain around your incisions.  You have more fluid or blood coming from your incisions.  Your incisions feel warm to the touch.  You have pus or a bad smell coming from your incisions. Get help right away if:  You have chest pain.  You have trouble breathing or are short of breath.  You become extremely tired.  You are light-headed or you faint. This information is not intended to replace advice given to you by your health care provider. Make sure you discuss any questions you have with your health care provider. Document Released: 08/18/2004 Document Revised: 11/11/2015 Document Reviewed: 11/11/2015 Elsevier Interactive Patient Education  2018 [employer] Inc.    Supplemental Discharge Instructions for  Pacemaker/Defibrillator Patients  ACTIVITY No heavy lifting or vigorous activity with your left/right arm for 6 to 8 weeks.  Do not raise your left/right arm above your head for one week.  Gradually raise your affected arm as drawn  below.           __  NO DRIVING for     ; you may begin driving on     .  WOUND CARE - Keep the wound area clean and dry.  Do not get this area wet for one week. No showers for one week; you may shower on     . - The tape/steri-strips on your wound will fall off; do not pull them off.  No bandage is needed on the site.  DO  NOT apply any creams, oils, or ointments to the wound area. - If you notice any drainage or discharge from the wound, any swelling or bruising at the site, or you develop a fever > 101? F after you are discharged home, call the office at once.  SPECIAL INSTRUCTIONS - You are still able to use cellular telephones; use the ear opposite the side where you have your pacemaker/defibrillator.  Avoid carrying your cellular phone near your device. - When traveling through airports, show security personnel your identification card to avoid being screened in the metal detectors.  Ask the security personnel to use the hand wand. - Avoid arc welding equipment, MRI testing (magnetic resonance imaging), TENS units (transcutaneous nerve stimulators).  Call the office for questions about other devices. - Avoid electrical appliances that are in poor condition or are not properly grounded. - Microwave ovens are safe to be near or to operate.  ADDITIONAL INFORMATION FOR DEFIBRILLATOR PATIENTS SHOULD YOUR DEVICE GO OFF: - If your device goes off ONCE and you feel fine afterward, notify the device clinic nurses. - If your device goes off ONCE and you do not feel well afterward, call 911. - If your device goes off TWICE, call 911. - If your device goes off THREE TIMES IN ONE DAY, call 911.  DO NOT DRIVE YOURSELF OR A FAMILY MEMBER WITH A DEFIBRILLATOR TO THE HOSPITAL-CALL 911.    [facility] - Preparing For Surgery  Before surgery, you can play an important role. Because skin is not sterile, your skin needs to be as free of germs as possible. You can reduce the number of germs on your  skin by washing with CHG (chlorahexidine gluconate) Soap before surgery.  CHG is an antiseptic cleaner which kills germs and bonds with the skin to continue killing germs even after washing.   Please do   not use if you have an allergy to CHG or antibacterial soaps.  If your skin becomes reddened/irritated stop using the CHG.   Do not shave (including legs and underarms) for at least 48 hours prior to first CHG shower.  It is OK to shave your face.  Please follow these instructions carefully:  1.  Shower the night before surgery and the morning of surgery with CHG.  2.  If you choose to wash your hair, wash your hair first as usual with your normal shampoo.  3.  After you shampoo, rinse your hair and body thoroughly to remove the shampoo.  4.  Use CHG as you would any other liquid soap.  You can apply CHG directly to the skin and wash gently with a clean washcloth. 5.  Apply the CHG Soap to your body ONLY FROM THE NECK DOWN.  Do not use on open wounds or open sores.  Avoid contact with your eyes, ears, mouth and genitals (private parts).  Wash genitals (private parts) with your normal soap.  6.  Wash thoroughly, paying special attention to the area where your surgery will be performed.  7.  Thoroughly rise your body with warm water from the neck down.   8.  DO NOT shower/wash with your normal soap after using and rinsing off the CHG soap.  9.  Pat yourself dry with a clean towel.           10.  Wear clean pajamas.           11.  Place clean sheets on your bed the night of your first shower and do not sleep with pets.  Day of Surgery: Do not apply any deodorants/lotions.  Please wear clean clothes to the hospital/surgery center.        Signed, Setareh Rom, MD  03/16/2020 1:30 PM    [facility] [address], [place], [place]  [address] Phone: [phone]; Fax: [phone]    

## 2020-03-16 NOTE — Patient Instructions (Signed)
Medications: Your physician recommends that you continue on your current medications as directed. Please refer to the Current Medication list given to you today.     * If you need a refill on your cardiac medications before your next appointment, please call your pharmacy. *  Testing/Procedures: Your physician has recommended that you have a pacemaker inserted. A pacemaker is a small device that is placed under the skin of your chest or abdomen to help control abnormal heart rhythms. This device uses electrical pulses to prompt the heart to beat at a normal rate. Pacemakers are used to treat heart rhythms that are too slow. Wire (leads) are attached to the pacemaker that goes into the chambers of you heart. This is done in the hospital and usually requires and overnight stay. Please follow the instructions below, located under the special instructions section.  Follow-Up: Your physician recommends that you schedule a wound check appointment 10-14 days, after your procedure on 03/21/20, with the device clinic.   Your physician recommends that you schedule a follow up appointment in 91 days, after your procedure on 03/21/20, with Dr. Sallyanne Kuster.  Thank you for choosing [facility]!!      Any Other Special Instructions Will Be Listed Below (If Applicable).     Implantable Device Instructions  You are scheduled for Permanent Transvenous Pacemaker on  03/21/20  with Dr. Sallyanne Kuster.  1.   Please arrive at the [facility], Entrance "A"  at [facility] at  1:30 pm on the day of your procedure. (The address is [address]  2. Do not eat or drink after midnight the night before your procedure.  3.  You will need to have the coronavirus test completed prior to your procedure. An appointment has been made at 2:10 pm on 03/17/20. This is a Drive Up Visit at [address], [place], [place] [address]. Please tell them that you are there for procedure testing. Stay in your car and  someone will be with you shortly. Please make sure to have all other labs completed before this test because you will need to stay quarantined until your procedure.  4.  Medications: Nothing to hold  5.  Plan for an overnight stay.  Bring your insurance cards and a list of you medications.  6.  Wash your chest and neck with surgical scrub the evening before and the morning of your procedure.  Rinse well. Please review the surgical scrub instruction sheet given to you. You may pick this up at the [facility] office or any drug store.  7. Your chest will need to be shaved prior to this procedure (if needed). We ask that you do this yourself at home 1 to 2 days before or if uncomfortable/unable to do yourself, then it will be performed by the hospital staff the day of.                                                                                                                * If  you have ant questions after you get home, please call Lattie Haw, RN at [phone]  * Every attempt is made to prevent procedures from being rescheduled.  Due to the nature of  Electrophysiology, rescheduling can happen.  The physician is always aware and directs the staff when this occurs.     Pacemaker Implantation, Adult Pacemaker implantation is a procedure to place a pacemaker inside your chest. A pacemaker is a small computer that sends electrical signals to the heart and helps your heart beat normally. A pacemaker also stores information about your heart rhythms. You may need pacemaker implantation if you:  Have a slow heartbeat (bradycardia).  Faint (syncope).  Have shortness of breath (dyspnea) due to heart problems.  The pacemaker attaches to your heart through a wire, called a lead. Sometimes just one lead is needed. Other times, there will be two leads. There are two types of pacemakers:  Transvenous pacemaker. This type is placed under the skin or muscle of your chest. The lead goes through a vein in the  chest area to reach the inside of the heart.  Epicardial pacemaker. This type is placed under the skin or muscle of your chest or belly. The lead goes through your chest to the outside of the heart.  Tell a health care provider about:  Any allergies you have.  All medicines you are taking, including vitamins, herbs, eye drops, creams, and over-the-counter medicines.  Any problems you or family members have had with anesthetic medicines.  Any blood or bone disorders you have.  Any surgeries you have had.  Any medical conditions you have.  Whether you are pregnant or may be pregnant. What are the risks? Generally, this is a safe procedure. However, problems may occur, including:  Infection.  Bleeding.  Failure of the pacemaker or the lead.  Collapse of a lung or bleeding into a lung.  Blood clot inside a blood vessel with a lead.  Damage to the heart.  Infection inside the heart (endocarditis).  Allergic reactions to medicines.  What happens before the procedure? Staying hydrated Follow instructions from your health care provider about hydration, which may include:  Up to 2 hours before the procedure - you may continue to drink clear liquids, such as water, clear fruit juice, black coffee, and plain tea.  Eating and drinking restrictions Follow instructions from your health care provider about eating and drinking, which may include:  8 hours before the procedure - stop eating heavy meals or foods such as meat, fried foods, or fatty foods.  6 hours before the procedure - stop eating light meals or foods, such as toast or cereal.  6 hours before the procedure - stop drinking milk or drinks that contain milk.  2 hours before the procedure - stop drinking clear liquids.  Medicines  Ask your health care provider about: ? Changing or stopping your regular medicines. This is especially important if you are taking diabetes medicines or blood thinners. ? Taking  medicines such as aspirin and ibuprofen. These medicines can thin your blood. Do not take these medicines before your procedure if your health care provider instructs you not to.  You may be given antibiotic medicine to help prevent infection. General instructions  You will have a heart evaluation. This may include an electrocardiogram (ECG), chest X-ray, and heart imaging (echocardiogram,  or echo) tests.  You will have blood tests.  Do not use any products that contain nicotine or tobacco, such as cigarettes and e-cigarettes. If you  need help quitting, ask your health care provider.  Plan to have someone take you home from the hospital or clinic.  If you will be going home right after the procedure, plan to have someone with you for 24 hours.  Ask your health care provider how your surgical site will be marked or identified. What happens during the procedure?  To reduce your risk of infection: ? Your health care team will wash or sanitize their hands. ? Your skin will be washed with soap. ? Hair may be removed from the surgical area.  An IV tube will be inserted into one of your veins.  You will be given one or more of the following: ? A medicine to help you relax (sedative). ? A medicine to numb the area (local anesthetic). ? A medicine to make you fall asleep (general anesthetic).  If you are getting a transvenous pacemaker: ? An incision will be made in your upper chest. ? A pocket will be made for the pacemaker. It may be placed under the skin or between layers of muscle. ? The lead will be inserted into a blood vessel that returns to the heart. ? While X-rays are taken by an imaging machine (fluoroscopy), the lead will be advanced through the vein to the inside of your heart. ? The other end of the lead will be tunneled under the skin and attached to the pacemaker.  If you are getting an epicardial pacemaker: ? An incision will be made near your ribs or breastbone  (sternum) for the lead. ? The lead will be attached to the outside of your heart. ? Another incision will be made in your chest or upper belly to create a pocket for the pacemaker. ? The free end of the lead will be tunneled under the skin and attached to the pacemaker.  The transvenous or epicardial pacemaker will be tested. Imaging studies may be done to check the lead position.  The incisions will be closed with stitches (sutures), adhesive strips, or skin glue.  Bandages (dressing) will be placed over the incisions. The procedure may vary among health care providers and hospitals. What happens after the procedure?  Your blood pressure, heart rate, breathing rate, and blood oxygen level will be monitored until the medicines you were given have worn off.  You will be given antibiotics and pain medicine.  ECG and chest x-rays will be done.  You will wear a continuous type of ECG (Holter monitor) to check your heart rhythm.  Your health care provider will program the pacemaker.  Do not drive for 24 hours if you received a sedative. This information is not intended to replace advice given to you by your health care provider. Make sure you discuss any questions you have with your health care provider. Document Released: 01/19/2002 Document Revised: 08/19/2015 Document Reviewed: 07/13/2015 Elsevier Interactive Patient Education  2018 [employer].     Pacemaker Implantation, Adult, Care After This sheet gives you information about how to care for yourself after your procedure. Your health care provider may also give you more specific instructions. If you have problems or questions, contact your health care provider. What can I expect after the procedure? After the procedure, it is common to have:  Mild pain.  Slight bruising.  Some swelling over the incision.  A slight bump over the skin where the device was placed. Sometimes, it is possible to feel the device under the skin.  This is normal.  Follow these instructions at  home: Medicines  Take over-the-counter and prescription medicines only as told by your health care provider.  If you were prescribed an antibiotic medicine, take it as told by your health care provider. Do not stop taking the antibiotic even if you start to feel better. Wound care  Do not remove the bandage on your chest until directed to do so by your health care provider.  After your bandage is removed, you may see pieces of tape called skin adhesive strips over the area where the cut was made (incision site). Let them fall off on their own.  Check the incision site every day to make sure it is not infected, bleeding, or starting to pull apart.  Do not use lotions or ointments near the incision site unless directed to do so.  Keep the incision area clean and dry for 2-3 days after the procedure or as directed by your health care provider. It takes several weeks for the incision site to completely heal.  Do not take baths, swim, or use a hot tub for 7-10 days or as otherwise directed by your health care provider. Activity  Do not drive or use heavy machinery while taking prescription pain medicine.  Do not drive for 24 hours if you were given a medicine to help you relax (sedative).  Check with your health care provider before you start to drive or play sports.  Avoid sudden jerking, pulling, or chopping movements that pull your upper arm far away from your body. Avoid these movements for at least 6 weeks or as long as told by your health care provider.  Do not lift your upper arm above your shoulders for at least 6 weeks or as long as told by your health care provider. This means no tennis, golf, or swimming.  You may go back to work when your health care provider says it is okay. Pacemaker care  You may be shown how to transfer data from your pacemaker through the phone to your health care provider.  Always let all health care  providers know about your pacemaker before you have any medical procedures or tests.  Wear a medical ID bracelet or necklace stating that you have a pacemaker. Carry a pacemaker ID card with you at all times.  Your pacemaker battery will last for 5-15 years. Routine checks by your health care provider will let the health care provider know when the battery is starting to run down. The pacemaker will need to be replaced when the battery starts to run down.  Do not use amateur Chief of Staff. Other electrical devices are safe to use, including power tools, lawn mowers, and speakers. If you are unsure of whether something is safe to use, ask your health care provider.  When using your cell phone, hold it to the ear opposite the pacemaker. Do not leave your cell phone in a pocket over the pacemaker.  Avoid places or objects that have a strong electric or magnetic field, including: ? Airport Herbalist. When at the airport, let officials know that you have a pacemaker. ? Power plants. ? Large electrical generators. ? Radiofrequency transmission towers, such as cell phone and radio towers. General instructions  Weigh yourself every day. If you suddenly gain weight, fluid may be building up in your body.  Keep all follow-up visits as told by your health care provider. This is important. Contact a health care provider if:  You gain weight suddenly.  Your legs or feet  swell.  It feels like your heart is fluttering or skipping beats (heart palpitations).  You have chills or a fever.  You have more redness, swelling, or pain around your incisions.  You have more fluid or blood coming from your incisions.  Your incisions feel warm to the touch.  You have pus or a bad smell coming from your incisions. Get help right away if:  You have chest pain.  You have trouble breathing or are short of breath.  You become extremely tired.  You are light-headed or  you faint. This information is not intended to replace advice given to you by your health care provider. Make sure you discuss any questions you have with your health care provider. Document Released: 08/18/2004 Document Revised: 11/11/2015 Document Reviewed: 11/11/2015 Elsevier Interactive Patient Education  2018 [place] Discharge Instructions for  Pacemaker/Defibrillator Patients  ACTIVITY No heavy lifting or vigorous activity with your left/right arm for 6 to 8 weeks.  Do not raise your left/right arm above your head for one week.  Gradually raise your affected arm as drawn below.           __  NO DRIVING for     ; you may begin driving on     .  WOUND CARE - Keep the wound area clean and dry.  Do not get this area wet for one week. No showers for one week; you may shower on     . - The tape/steri-strips on your wound will fall off; do not pull them off.  No bandage is needed on the site.  DO  NOT apply any creams, oils, or ointments to the wound area. - If you notice any drainage or discharge from the wound, any swelling or bruising at the site, or you develop a fever > 101? F after you are discharged home, call the office at once.  SPECIAL INSTRUCTIONS - You are still able to use cellular telephones; use the ear opposite the side where you have your pacemaker/defibrillator.  Avoid carrying your cellular phone near your device. - When traveling through airports, show security personnel your identification card to avoid being screened in the metal detectors.  Ask the security personnel to use the hand wand. - Avoid arc welding equipment, MRI testing (magnetic resonance imaging), TENS units (transcutaneous nerve stimulators).  Call the office for questions about other devices. - Avoid electrical appliances that are in poor condition or are not properly grounded. - Microwave ovens are safe to be near or to operate.  ADDITIONAL INFORMATION FOR DEFIBRILLATOR  PATIENTS SHOULD YOUR DEVICE GO OFF: - If your device goes off ONCE and you feel fine afterward, notify the device clinic nurses. - If your device goes off ONCE and you do not feel well afterward, call 911. - If your device goes off TWICE, call 911. - If your device goes off [place], call 911.  DO NOT DRIVE YOURSELF OR A FAMILY MEMBER WITH A DEFIBRILLATOR TO THE HOSPITAL-CALL 911.    [place] - Preparing For Surgery  Before surgery, you can play an important role. Because skin is not sterile, your skin needs to be as free of germs as possible. You can reduce the number of germs on your skin by washing with CHG (chlorahexidine gluconate) Soap before surgery.  CHG is an antiseptic cleaner which kills germs and bonds with the skin to continue killing germs even after washing.   Please do not  use if you have an allergy to CHG or antibacterial soaps.  If your skin becomes reddened/irritated stop using the CHG.   Do not shave (including legs and underarms) for at least 48 hours prior to first CHG shower.  It is OK to shave your face.  Please follow these instructions carefully:  1.  Shower the night before surgery and the morning of surgery with CHG.  2.  If you choose to wash your hair, wash your hair first as usual with your normal shampoo.  3.  After you shampoo, rinse your hair and body thoroughly to remove the shampoo.  4.  Use CHG as you would any other liquid soap.  You can apply CHG directly to the skin and wash gently with a clean washcloth. 5.  Apply the CHG Soap to your body ONLY FROM THE NECK DOWN.  Do not use on open wounds or open sores.  Avoid contact with your eyes, ears, mouth and genitals (private parts).  Wash genitals (private parts) with your normal soap.  6.  Wash thoroughly, paying special attention to the area where your surgery will be performed.  7.  Thoroughly rise your body with warm water from the neck down.   8.  DO NOT shower/wash with your normal  soap after using and rinsing off the CHG soap.  9.  Pat yourself dry with a clean towel.           10.  Wear clean pajamas.           11.  Place clean sheets on your bed the night of your first shower and do not sleep with pets.  Day of Surgery: Do not apply any deodorants/lotions.  Please wear clean clothes to the hospital/surgery center.

## 2020-03-17 ENCOUNTER — Other Ambulatory Visit (HOSPITAL_COMMUNITY)
Admission: RE | Admit: 2020-03-17 | Discharge: 2020-03-17 | Disposition: A | Discharge status: Home / Self Care | Payer: Medicare Other | Source: Ambulatory Visit | Attending: Cardiovascular Disease | Admitting: Cardiovascular Disease

## 2020-03-17 DIAGNOSIS — Z01812 Encounter for preprocedural laboratory examination: Secondary | ICD-10-CM | POA: Diagnosis present

## 2020-03-17 DIAGNOSIS — Z20822 Contact with and (suspected) exposure to covid-19: Secondary | ICD-10-CM | POA: Insufficient documentation

## 2020-03-17 LAB — SARS CORONAVIRUS 2 (TAT 6-24 HRS): SARS Coronavirus 2: NEGATIVE

## 2020-03-18 ENCOUNTER — Telehealth: Payer: Self-pay | Admitting: Cardiovascular Disease

## 2020-03-18 NOTE — Telephone Encounter (Signed)
New Message:    Pt is scheduled  To have a pacemaker put in on Monday. Daughter says she have some questions about his instructions please.

## 2020-03-18 NOTE — Telephone Encounter (Signed)
Discussed pre-procedure instructions with daughter (ok per DPR)-no further questions at this time.

## 2020-03-20 ENCOUNTER — Other Ambulatory Visit: Payer: Self-pay | Admitting: *Deleted

## 2020-03-20 DIAGNOSIS — I441 Atrioventricular block, second degree: Secondary | ICD-10-CM

## 2020-03-20 MED ORDER — SODIUM CHLORIDE 0.9% FLUSH: 3.0000 mL | Freq: Two times a day (BID) | INTRAVENOUS | Status: DC

## 2020-03-21 ENCOUNTER — Ambulatory Visit (HOSPITAL_COMMUNITY): Payer: Medicare Other

## 2020-03-21 ENCOUNTER — Other Ambulatory Visit: Payer: Self-pay

## 2020-03-21 ENCOUNTER — Encounter: Payer: Self-pay | Admitting: *Deleted

## 2020-03-21 ENCOUNTER — Ambulatory Visit (HOSPITAL_COMMUNITY)
Admission: RE | Admit: 2020-03-21 | Discharge: 2020-03-21 | Disposition: A | Discharge status: Home / Self Care | Payer: Medicare Other | Attending: Cardiovascular Disease | Admitting: Cardiovascular Disease

## 2020-03-21 ENCOUNTER — Encounter (HOSPITAL_COMMUNITY)
Admission: RE | Disposition: A | Discharge status: Home / Self Care | Payer: Self-pay | Source: Home / Self Care | Attending: Cardiovascular Disease

## 2020-03-21 DIAGNOSIS — R001 Bradycardia, unspecified: Secondary | ICD-10-CM | POA: Diagnosis not present

## 2020-03-21 DIAGNOSIS — I10 Essential (primary) hypertension: Secondary | ICD-10-CM | POA: Diagnosis not present

## 2020-03-21 DIAGNOSIS — I441 Atrioventricular block, second degree: Secondary | ICD-10-CM | POA: Insufficient documentation

## 2020-03-21 DIAGNOSIS — Z79899 Other long term (current) drug therapy: Secondary | ICD-10-CM | POA: Diagnosis not present

## 2020-03-21 DIAGNOSIS — Z7982 Long term (current) use of aspirin: Secondary | ICD-10-CM | POA: Diagnosis not present

## 2020-03-21 DIAGNOSIS — Z950 Presence of cardiac pacemaker: Secondary | ICD-10-CM

## 2020-03-21 DIAGNOSIS — I4892 Unspecified atrial flutter: Secondary | ICD-10-CM | POA: Diagnosis not present

## 2020-03-21 DIAGNOSIS — I4510 Unspecified right bundle-branch block: Secondary | ICD-10-CM

## 2020-03-21 HISTORY — PX: PACEMAKER IMPLANT: EP1218

## 2020-03-21 SURGERY — PACEMAKER IMPLANT

## 2020-03-21 MED ORDER — LIDOCAINE HCL (PF) 1 % IJ SOLN
INTRAMUSCULAR | Status: DC | PRN
  Administered 2020-03-21: 55 mL

## 2020-03-21 MED ORDER — SODIUM CHLORIDE 0.9 % IV SOLN
INTRAVENOUS | Status: AC
  Filled 2020-03-21: qty 2

## 2020-03-21 MED ORDER — ONDANSETRON HCL 4 MG/2ML IJ SOLN: 4.0000 mg | Freq: Four times a day (QID) | INTRAMUSCULAR | Status: DC | PRN

## 2020-03-21 MED ORDER — LABETALOL HCL 5 MG/ML IV SOLN
INTRAVENOUS | Status: DC | PRN
  Administered 2020-03-21: 10 mg via INTRAVENOUS

## 2020-03-21 MED ORDER — SODIUM CHLORIDE 0.9% FLUSH: 3.0000 mL | INTRAVENOUS | Status: DC | PRN

## 2020-03-21 MED ORDER — HEPARIN (PORCINE) IN NACL 1000-0.9 UT/500ML-% IV SOLN
INTRAVENOUS | Status: AC
  Filled 2020-03-21: qty 500

## 2020-03-21 MED ORDER — SODIUM CHLORIDE 0.9% FLUSH: 3.0000 mL | Freq: Two times a day (BID) | INTRAVENOUS | Status: DC

## 2020-03-21 MED ORDER — LABETALOL HCL 5 MG/ML IV SOLN
INTRAVENOUS | Status: AC
  Filled 2020-03-21: qty 4

## 2020-03-21 MED ORDER — SODIUM CHLORIDE 0.9 % IV SOLN
80.0000 mg | INTRAVENOUS | Status: AC
  Administered 2020-03-21: 80 mg

## 2020-03-21 MED ORDER — VANCOMYCIN HCL IN DEXTROSE 1-5 GM/200ML-% IV SOLN
1000.0000 mg | INTRAVENOUS | Status: AC
  Administered 2020-03-21: 1000 mg via INTRAVENOUS

## 2020-03-21 MED ORDER — VANCOMYCIN HCL IN DEXTROSE 1-5 GM/200ML-% IV SOLN
INTRAVENOUS | Status: AC
  Filled 2020-03-21: qty 200

## 2020-03-21 MED ORDER — LIDOCAINE HCL (PF) 1 % IJ SOLN
INTRAMUSCULAR | Status: AC
  Filled 2020-03-21: qty 60

## 2020-03-21 MED ORDER — CHLORHEXIDINE GLUCONATE 4 % EX LIQD: 4.0000 "application " | Freq: Once | CUTANEOUS | Status: DC

## 2020-03-21 MED ORDER — ACETAMINOPHEN 325 MG PO TABS: 325.0000 mg | ORAL_TABLET | ORAL | Status: DC | PRN

## 2020-03-21 MED ORDER — HEPARIN (PORCINE) IN NACL 1000-0.9 UT/500ML-% IV SOLN
INTRAVENOUS | Status: DC | PRN
  Administered 2020-03-21: 500 mL

## 2020-03-21 MED ORDER — SODIUM CHLORIDE 0.9 % IV SOLN: 250.0000 mL | INTRAVENOUS | Status: DC

## 2020-03-21 MED ORDER — SODIUM CHLORIDE 0.9 % IV SOLN: INTRAVENOUS | Status: DC

## 2020-03-21 MED ORDER — SODIUM CHLORIDE 0.9 % IV SOLN: 250.0000 mL | INTRAVENOUS | Status: DC | PRN

## 2020-03-21 SURGICAL SUPPLY — 8 items
CABLE SURGICAL S-101-97-12 (CABLE) ×2 IMPLANT
IPG PACE AZUR XT DR MRI W1DR01 (Pacemaker) ×1 IMPLANT
LEAD CAPSURE NOVUS 5076-52CM (Lead) ×2 IMPLANT
LEAD CAPSURE NOVUS 5076-58CM (Lead) ×2 IMPLANT
PACE AZURE XT DR MRI W1DR01 (Pacemaker) ×2 IMPLANT
PAD PRO RADIOLUCENT 2001M-C (PAD) ×2 IMPLANT
SHEATH 7FR PRELUDE SNAP 13 (SHEATH) ×4 IMPLANT
TRAY PACEMAKER INSERTION (PACKS) ×2 IMPLANT

## 2020-03-21 NOTE — Progress Notes (Signed)
Patient ID: Terry Gregory, male   DOB: 03/26/1921, 87 y.o.   MRN: [id number] Received notification from [facility], patient removed their ZIO AT heart monitor on 03/20/20 and will return the device before the expected 14 day period.

## 2020-03-21 NOTE — Interval H&P Note (Signed)
History and Physical Interval Note:  03/21/2020 1:37 PM  Terry Gregory  has presented today for surgery, with the diagnosis of second degree AV block.  The various methods of treatment have been discussed with the patient and family. After consideration of risks, benefits and other options for treatment, the patient has consented to  Procedure(s): PACEMAKER IMPLANT (N/A) as a surgical intervention.  The patient's history has been reviewed, patient examined, no change in status, stable for surgery.  I have reviewed the patient's chart and labs.  Questions were answered to the patient's satisfaction.     Forever Arechiga

## 2020-03-21 NOTE — Progress Notes (Addendum)
Dr Sallyanne Kuster in and ok to d/c home now

## 2020-03-21 NOTE — Discharge Instructions (Signed)

## 2020-03-21 NOTE — Op Note (Signed)
Procedure report  Procedure performed:  1. Implantation of new dual chamber permanent pacemaker 2. Fluoroscopy  Reason for procedure: Symptomatic bradycardia due to: Second degree atrioventricular block Mobitz type I    Procedure performed by: Sanda Klein, MD  Complications: None  Estimated blood loss: <10 mL  Medications administered during procedure: Vancomycin 1 g intravenously Lidocaine 1% 30 mL locally,   Device details: Generator Medtronic Azure XT DR model [id number] serial number [id number] G Right atrial lead Medtronic 5076 serial number [id number] Right ventricular lead Medtronic [id number] serial number [id number]  Procedure details:  After the risks and benefits of the procedure were discussed the patient provided informed consent and was brought to the cardiac cath lab in the fasting state. The patient was prepped and draped in usual sterile fashion. Local anesthesia with 1% lidocaine was administered to to the left infraclavicular area. A 5-6 cm horizontal incision was made parallel with and 2-3 cm caudal to the left clavicle. Using electrocautery and blunt dissection a prepectoral pocket was created down to the level of the pectoralis major muscle fascia. The pocket was carefully inspected for hemostasis. An antibiotic-soaked sponge was placed in the pocket.  Under fluoroscopic guidance and using the modified Seldinger technique 2 separate venipunctures were performed to access the left subclavian vein. No difficulty was encountered accessing the vein.  Two J-tip guidewires were subsequently exchanged for two 7 French safe sheaths.  Under fluoroscopic guidance the ventricular lead was advanced to level of the mid to apical right ventricular septum and thet active-fixation helix was deployed. Prominent current of injury was seen. Satisfactory pacing and sensing parameters were recorded. There was no evidence of diaphragmatic stimulation at maximum device output. The safe  sheath was peeled away and the lead was secured in place with 2-0 silk.  In similar fashion the right atrial lead was advanced to the level of the atrial appendage. The active-fixation helix was deployed. There was prominent current of injury. Satisfactory  pacing and sensing parameters were recorded. There was no evidence of diaphragmatic stimulation with pacing at maximum device output. The safe sheath was peeled away and the lead was secured in place with 2-0 silk.  The antibiotic-soaked sponge was removed from the pocket. The pocket was flushed with copious amounts of antibiotic solution. Reinspection showed excellent hemostasis..  The ventricular lead was connected to the generator and appropriate ventricular pacing was seen. Subsequently the atrial lead was also connected. Repeat testing of the lead parameters later showed excellent values.  The entire system was then carefully inserted in the pocket with care been taking that the leads and device assumed a comfortable position without pressure on the incision. Great care was taken that the leads be located deep to the generator. The pocket was then closed in layers using 2 layers of 2-0 Vicryl, one layer of 3-0 Vicryl and cutaneous steristrips, after which a sterile dressing was applied.  At the end of the procedure the following lead parameters were encountered:  Right atrial lead  sensed P waves 1.1 mV, impedance 627 ohms, threshold 0.5 V at 0.4 ms pulse width.  Right ventricular lead sensed R waves 12.5 mV, impedance 836 ohms, threshold 0.75 V at 0.4 ms pulse width.  Sanda Klein, MD, [facility] [phone] office [phone] pager

## 2020-03-22 ENCOUNTER — Encounter (HOSPITAL_COMMUNITY): Payer: Self-pay | Admitting: Cardiovascular Disease

## 2020-03-28 ENCOUNTER — Ambulatory Visit: Payer: Medicare Other | Admitting: General Practice

## 2020-03-29 ENCOUNTER — Encounter: Payer: Self-pay | Admitting: Cardiology

## 2020-04-05 ENCOUNTER — Other Ambulatory Visit: Payer: Self-pay

## 2020-04-05 ENCOUNTER — Ambulatory Visit (INDEPENDENT_AMBULATORY_CARE_PROVIDER_SITE_OTHER): Payer: Medicare Other | Admitting: Emergency Medicine

## 2020-04-05 DIAGNOSIS — I441 Atrioventricular block, second degree: Secondary | ICD-10-CM

## 2020-04-05 LAB — CUP PACEART INCLINIC DEVICE CHECK
Battery Remaining Longevity: 138 mo
Battery Voltage: 3.21 V
Brady Statistic AP VP Percent: 23.51 %
Brady Statistic AP VS Percent: 0.24 %
Brady Statistic AS VP Percent: 75.45 %
Brady Statistic AS VS Percent: 0.8 %
Brady Statistic RA Percent Paced: 23.81 %
Brady Statistic RV Percent Paced: 98.96 %
Date Time Interrogation Session: 20220222114100
Implantable Lead Implant Date: 20220207
Implantable Lead Implant Date: 20220207
Implantable Lead Location: 753859
Implantable Lead Location: 753860
Implantable Lead Model: 5076
Implantable Lead Model: 5076
Implantable Pulse Generator Implant Date: 20220207
Lead Channel Impedance Value: 361 Ohm
Lead Channel Impedance Value: 361 Ohm
Lead Channel Impedance Value: 456 Ohm
Lead Channel Impedance Value: 456 Ohm
Lead Channel Impedance Value: 475 Ohm
Lead Channel Impedance Value: 475 Ohm
Lead Channel Impedance Value: 570 Ohm
Lead Channel Impedance Value: 570 Ohm
Lead Channel Pacing Threshold Amplitude: 0.75 V
Lead Channel Pacing Threshold Amplitude: 0.75 V
Lead Channel Pacing Threshold Pulse Width: 0.4 ms
Lead Channel Pacing Threshold Pulse Width: 0.4 ms
Lead Channel Sensing Intrinsic Amplitude: 13 mV
Lead Channel Sensing Intrinsic Amplitude: 5 mV
Lead Channel Setting Pacing Amplitude: 3.5 V
Lead Channel Setting Pacing Amplitude: 3.5 V
Lead Channel Setting Pacing Pulse Width: 0.4 ms
Lead Channel Setting Sensing Sensitivity: 1.2 mV

## 2020-04-05 NOTE — Progress Notes (Signed)

## 2020-04-05 NOTE — Patient Instructions (Signed)
Plug in home monitor within 3-6 feet of where you sleep at home.

## 2020-04-11 ENCOUNTER — Ambulatory Visit: Payer: Medicare Other | Admitting: General Practice

## 2020-06-20 ENCOUNTER — Other Ambulatory Visit: Payer: Self-pay

## 2020-06-20 ENCOUNTER — Ambulatory Visit (INDEPENDENT_AMBULATORY_CARE_PROVIDER_SITE_OTHER): Payer: Medicare Other | Admitting: Cardiovascular Disease

## 2020-06-20 ENCOUNTER — Encounter: Payer: Self-pay | Admitting: Cardiovascular Disease

## 2020-06-20 VITALS — BP 182/74 | HR 73 | Ht 75.0 in | Wt 210.4 lb

## 2020-06-20 DIAGNOSIS — Z950 Presence of cardiac pacemaker: Secondary | ICD-10-CM

## 2020-06-20 DIAGNOSIS — I441 Atrioventricular block, second degree: Secondary | ICD-10-CM | POA: Diagnosis not present

## 2020-06-20 DIAGNOSIS — I493 Ventricular premature depolarization: Secondary | ICD-10-CM

## 2020-06-20 DIAGNOSIS — I10 Essential (primary) hypertension: Secondary | ICD-10-CM

## 2020-06-20 DIAGNOSIS — I3139 Other pericardial effusion (noninflammatory): Secondary | ICD-10-CM

## 2020-06-20 DIAGNOSIS — I483 Typical atrial flutter: Secondary | ICD-10-CM | POA: Diagnosis not present

## 2020-06-20 DIAGNOSIS — I313 Pericardial effusion (noninflammatory): Secondary | ICD-10-CM

## 2020-06-20 NOTE — Progress Notes (Signed)
Cardiology Office Note    Date:  06/20/2020   ID:  Terry Gregory, DOB 06-15-1921, MRN [id number]  PCP:  Burnard Bunting, MD  Cardiologist:   Terry Klein, MD   Chief complaint: Follow-up after pacemaker implantation  History of Present Illness:  Terry Gregory is a 87 y.o. male returning in follow-up for hypertension and cardiac conduction system disease.  He also has a history of idiopathic pericardial effusion in 2014-2016 that resolved spontaneously.  He had a single episode of atrial flutter with 4: 1 AV conduction in 2017, without clinical or event monitor evidence for recurrence.  More recently he has developed episodes of near syncope and an event monitor showed periods of extreme bradycardia due to second-degree atrioventricular block Mobitz type I, including periods of 2: 1 AV block with a ventricular rate of slowest 29 bpm (narrow QRS).  He underwent implantation of a dual-chamber permanent pacemaker (Medtronic Azure) in February 2022 and returns for his first clinic visit since that time.  Not had any further episodes of near syncope since pacemaker implantation and reports that he feels "calmer".  Device interrogation shows normal function.  Estimated generator longevity is 11.3 years.  All lead parameters are excellent.  There is 35% atrial pacing and 99.3% ventricular pacing.  Heart rate histogram distribution appears normal.  There have been no episodes of atrial fibrillation or high ventricular rates.  ECG today shows atrial sensed, ventricular paced rhythm with fairly frequent PVCs and with atrial pacing following the post PVC pauses.  He had a single event of documented atrial arrhythmia, atrial flutter with 4: 1 AV conduction in June 2017, recurrence not seen on a subsequent event monitor. In view of his advanced age, low prevalence of the arrhythmia, absence of history of neurological events, rather unsteady gait and personal preference, anticoagulants were not prescribed.   Long-standing AV conduction abnormalities with Mobitz type I second-degree AV block, otherwise with very long first-degree AV block, right bundle branch block.  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   Past Medical History:  Diagnosis Date  . BPH (benign prostatic hyperplasia)   . Diverticulosis   . Pericardial effusion     Past Surgical History:  Procedure Laterality Date  . APPENDECTOMY  1960  . CATARACT EXTRACTION  2009   both eyes  . CHOLECYSTECTOMY  2000  . INGUINAL HERNIA REPAIR  1980's  . PACEMAKER IMPLANT N/A 03/21/2020   Procedure: PACEMAKER IMPLANT;  Surgeon: Terry Klein, MD;  Location: [facility];  Service: Cardiovascular;  Laterality: N/A;  . TOTAL HIP ARTHROPLASTY  2003   right  . WRIST SURGERY  2009   left    Current Medications: Outpatient Medications Prior to Visit  Medication Sig Dispense Refill  . aspirin EC 81 MG tablet Take 81 mg by mouth daily.    . finasteride (PROSCAR) 5 MG tablet Take 5 mg by mouth daily.     Facility-Administered Medications Prior to Visit  Medication Dose Route Frequency Provider Last Rate Last Admin  . sodium chloride flush (NS) 0.9 % injection 3 mL  3 mL Intravenous Q12H Jarae Panas, MD         Allergies:   Erythromycin and Penicillins   Social History   Socioeconomic History  . Marital status: Widowed  Spouse name: Not on file  . Number of children: Not on file  . Years of education: Not on file  . Highest education level: Not on file  Occupational History  . Not on file  Tobacco Use  . Smoking status: Never Smoker  . Smokeless tobacco: Never Used  Substance and Sexual Activity  . Alcohol use: No  . Drug use: No  . Sexual activity: Not on file  Other Topics Concern  . Not on  file  Social History Narrative  . Not on file   Social Determinants of Health   Financial Resource Strain: Not on file  Food Insecurity: Not on file  Transportation Needs: Not on file  Physical Activity: Not on file  Stress: Not on file  Social Connections: Not on file     Family History:  The patient's family history includes Cancer in his sister; Diabetes in his sister; Heart attack in his father; Heart failure in his brother; Stroke in his brother and mother.   ROS:   Please see the history of present illness.    All other systems are reviewed and are negative.   PHYSICAL EXAM:   VS:  BP (!) 182/74   Pulse 73   Ht 6\' 3"  (1.905 m)   Wt 210 lb 6.4 oz (95.4 kg)   SpO2 98%   BMI 26.30 kg/m    BP recheck 156/72 mmHg.  General: Alert, oriented x3, no distress, the left subclavian pacemaker site has healed well Head: no evidence of trauma, PERRL, EOMI, no exophtalmos or lid lag, no myxedema, no xanthelasma; normal ears, nose and oropharynx Neck: normal jugular venous pulsations and no hepatojugular reflux; brisk carotid pulses without delay and no carotid bruits Chest: clear to auscultation, no signs of consolidation by percussion or palpation, normal fremitus, symmetrical and full respiratory excursions Cardiovascular: normal position and quality of the apical impulse, regular rhythm, normal first and second heart sounds, no murmurs, rubs or gallops Abdomen: no tenderness or distention, no masses by palpation, no abnormal pulsatility or arterial bruits, normal bowel sounds, no hepatosplenomegaly Extremities: no clubbing, cyanosis or edema; 2+ radial, ulnar and brachial pulses bilaterally; 2+ right femoral, posterior tibial and dorsalis pedis pulses; 2+ left femoral, posterior tibial and dorsalis pedis pulses; no subclavian or femoral bruits Neurological: grossly nonfocal Psych: Normal mood and affect     Wt Readings from Last 3 Encounters:  06/20/20 210 lb 6.4 oz (95.4 kg)   03/21/20 200 lb (90.7 kg)  03/16/20 204 lb (92.5 kg)      Studies/Labs Reviewed:   EKG:  EKG from January 21 personally reviewed.  This shows sinus rhythm with very long AV conduction times and intermittent dropped beats due to second-degree AV block.  A single PVC is also seen.  ASSESSMENT:    1. Second degree atrioventricular block   2. Pacemaker   3. Typical atrial flutter ([place])   4. PVCs (premature ventricular contractions)   5. Essential hypertension   6. Idiopathic pericardial effusion     PLAN:  In order of problems listed above:  1. Pacemaker: As anticipated, he has a very high prevalence of ventricular pacing due to significant AV conduction abnormalities.  Fortunately, this is not associated with any evidence of heart failure.   2. Second-degree AV block: This was asymptomatic for a long time, but eventually led to significant bradycardia requiring pacemaker implantation. 3. AFlutter: Only documented once (4 years ago) and spontaneously rate controlled.   I think it still the  right choice not to provide anticoagulation..  Embolic risk is relatively low and probably matches the risk of bleeding in this elderly gentleman. CHADSVasc 3 (age 75, HTN).  We will have much better monitoring for recurrence of atrial arrhythmia now that he has a dual-chamber pacemaker. 4. PVCs: Always asymptomatic.  Fairly frequent. 5. HTN: Intermittently his blood pressure has been elevated, he prefers fewer medications.  Asked him to monitor at home.  No medications at this time. 6. Hx peric eff: Etiology never identified, resolved spontaneously.     Medication Adjustments/Labs and Tests Ordered: Current medicines are reviewed at length with the patient today.  Concerns regarding medicines are outlined above.  Medication changes, Labs and Tests ordered today are listed in the Patient Instructions below. Patient Instructions  Medication Instructions:  No changes *If you need a refill on your  cardiac medications before your next appointment, please call your pharmacy*   Lab Work: None ordered If you have labs (blood work) drawn today and your tests are completely normal, you will receive your results only by: Marland Kitchen MyChart Message (if you have MyChart) OR . A paper copy in the mail If you have any lab test that is abnormal or we need to change your treatment, we will call you to review the results.   Testing/Procedures: None ordered   Follow-Up: At [facility], you and your health needs are our priority.  As part of our continuing mission to provide you with exceptional heart care, we have created designated Provider Care Teams.  These Care Teams include your primary Cardiologist (physician) and Advanced Practice Providers (APPs -  Physician Assistants and Nurse Practitioners) who all work together to provide you with the care you need, when you need it.  We recommend signing up for the patient portal called "MyChart".  Sign up information is provided on this After Visit Summary.  MyChart is used to connect with patients for Virtual Visits (Telemedicine).  Patients are able to view lab/test results, encounter notes, upcoming appointments, etc.  Non-urgent messages can be sent to your provider as well.   To learn more about what you can do with MyChart, go to [email or web address].    Your next appointment:   12 month(s)  The format for your next appointment:   In Person  Provider:   Sanda Klein, MD       Signed, Terry Klein, MD  06/20/2020 2:53 PM    [address], [place], [place]  [address] Phone: [phone]; Fax: [phone]

## 2020-06-20 NOTE — Patient Instructions (Signed)

## 2020-06-21 ENCOUNTER — Ambulatory Visit (INDEPENDENT_AMBULATORY_CARE_PROVIDER_SITE_OTHER): Payer: Medicare Other

## 2020-06-21 DIAGNOSIS — I441 Atrioventricular block, second degree: Secondary | ICD-10-CM

## 2020-06-21 LAB — CUP PACEART REMOTE DEVICE CHECK
Battery Remaining Longevity: 134 mo
Battery Voltage: 3.18 V
Brady Statistic AP VP Percent: 32.22 %
Brady Statistic AP VS Percent: 0 %
Brady Statistic AS VP Percent: 66.12 %
Brady Statistic AS VS Percent: 1.65 %
Brady Statistic RA Percent Paced: 32.63 %
Brady Statistic RV Percent Paced: 98.35 %
Date Time Interrogation Session: 20220510015949
Implantable Lead Implant Date: 20220207
Implantable Lead Implant Date: 20220207
Implantable Lead Location: 753859
Implantable Lead Location: 753860
Implantable Lead Model: 5076
Implantable Lead Model: 5076
Implantable Pulse Generator Implant Date: 20220207
Lead Channel Impedance Value: 342 Ohm
Lead Channel Impedance Value: 418 Ohm
Lead Channel Impedance Value: 418 Ohm
Lead Channel Impedance Value: 494 Ohm
Lead Channel Pacing Threshold Amplitude: 0.625 V
Lead Channel Pacing Threshold Amplitude: 0.75 V
Lead Channel Pacing Threshold Pulse Width: 0.4 ms
Lead Channel Pacing Threshold Pulse Width: 0.4 ms
Lead Channel Sensing Intrinsic Amplitude: 14.625 mV
Lead Channel Sensing Intrinsic Amplitude: 14.625 mV
Lead Channel Sensing Intrinsic Amplitude: 4.375 mV
Lead Channel Sensing Intrinsic Amplitude: 4.375 mV
Lead Channel Setting Pacing Amplitude: 2 V
Lead Channel Setting Pacing Amplitude: 2.5 V
Lead Channel Setting Pacing Pulse Width: 0.4 ms
Lead Channel Setting Sensing Sensitivity: 1.2 mV

## 2020-06-22 ENCOUNTER — Encounter (HOSPITAL_BASED_OUTPATIENT_CLINIC_OR_DEPARTMENT_OTHER): Payer: Self-pay | Admitting: *Deleted

## 2020-06-22 ENCOUNTER — Emergency Department (HOSPITAL_BASED_OUTPATIENT_CLINIC_OR_DEPARTMENT_OTHER): Payer: Medicare Other

## 2020-06-22 ENCOUNTER — Other Ambulatory Visit: Payer: Self-pay

## 2020-06-22 ENCOUNTER — Inpatient Hospital Stay (HOSPITAL_BASED_OUTPATIENT_CLINIC_OR_DEPARTMENT_OTHER)
Admission: EM | Admit: 2020-06-22 | Discharge: 2020-06-26 | DRG: 446 | Disposition: A | Discharge status: Home / Self Care | Payer: Medicare Other | Attending: Family Medicine | Admitting: Family Medicine

## 2020-06-22 DIAGNOSIS — R7989 Other specified abnormal findings of blood chemistry: Secondary | ICD-10-CM | POA: Diagnosis present

## 2020-06-22 DIAGNOSIS — I10 Essential (primary) hypertension: Secondary | ICD-10-CM | POA: Diagnosis present

## 2020-06-22 DIAGNOSIS — R1084 Generalized abdominal pain: Secondary | ICD-10-CM

## 2020-06-22 DIAGNOSIS — K5900 Constipation, unspecified: Secondary | ICD-10-CM | POA: Diagnosis present

## 2020-06-22 DIAGNOSIS — K831 Obstruction of bile duct: Secondary | ICD-10-CM

## 2020-06-22 DIAGNOSIS — Z8249 Family history of ischemic heart disease and other diseases of the circulatory system: Secondary | ICD-10-CM

## 2020-06-22 DIAGNOSIS — W1830XA Fall on same level, unspecified, initial encounter: Secondary | ICD-10-CM | POA: Diagnosis present

## 2020-06-22 DIAGNOSIS — R1310 Dysphagia, unspecified: Secondary | ICD-10-CM | POA: Diagnosis present

## 2020-06-22 DIAGNOSIS — H9193 Unspecified hearing loss, bilateral: Secondary | ICD-10-CM | POA: Diagnosis present

## 2020-06-22 DIAGNOSIS — Z79899 Other long term (current) drug therapy: Secondary | ICD-10-CM

## 2020-06-22 DIAGNOSIS — Z9049 Acquired absence of other specified parts of digestive tract: Secondary | ICD-10-CM

## 2020-06-22 DIAGNOSIS — K8051 Calculus of bile duct without cholangitis or cholecystitis with obstruction: Principal | ICD-10-CM | POA: Diagnosis present

## 2020-06-22 DIAGNOSIS — N400 Enlarged prostate without lower urinary tract symptoms: Secondary | ICD-10-CM

## 2020-06-22 DIAGNOSIS — Z881 Allergy status to other antibiotic agents status: Secondary | ICD-10-CM

## 2020-06-22 DIAGNOSIS — I441 Atrioventricular block, second degree: Secondary | ICD-10-CM

## 2020-06-22 DIAGNOSIS — Z7982 Long term (current) use of aspirin: Secondary | ICD-10-CM

## 2020-06-22 DIAGNOSIS — Z950 Presence of cardiac pacemaker: Secondary | ICD-10-CM

## 2020-06-22 DIAGNOSIS — Z419 Encounter for procedure for purposes other than remedying health state, unspecified: Secondary | ICD-10-CM

## 2020-06-22 DIAGNOSIS — Z20822 Contact with and (suspected) exposure to covid-19: Secondary | ICD-10-CM | POA: Diagnosis present

## 2020-06-22 DIAGNOSIS — K8050 Calculus of bile duct without cholangitis or cholecystitis without obstruction: Secondary | ICD-10-CM

## 2020-06-22 DIAGNOSIS — R55 Syncope and collapse: Secondary | ICD-10-CM

## 2020-06-22 DIAGNOSIS — Z96641 Presence of right artificial hip joint: Secondary | ICD-10-CM | POA: Diagnosis present

## 2020-06-22 DIAGNOSIS — R600 Localized edema: Secondary | ICD-10-CM | POA: Diagnosis present

## 2020-06-22 DIAGNOSIS — R1011 Right upper quadrant pain: Secondary | ICD-10-CM

## 2020-06-22 DIAGNOSIS — Z880 Allergy status to penicillin: Secondary | ICD-10-CM

## 2020-06-22 LAB — COMPREHENSIVE METABOLIC PANEL
ALT: 485 U/L — ABNORMAL HIGH (ref 0–44)
AST: 1074 U/L — ABNORMAL HIGH (ref 15–41)
Albumin: 3.6 g/dL (ref 3.5–5.0)
Alkaline Phosphatase: 211 U/L — ABNORMAL HIGH (ref 38–126)
Anion gap: 10 (ref 5–15)
BUN: 29 mg/dL — ABNORMAL HIGH (ref 8–23)
CO2: 23 mmol/L (ref 22–32)
Calcium: 8.6 mg/dL — ABNORMAL LOW (ref 8.9–10.3)
Chloride: 101 mmol/L (ref 98–111)
Creatinine, Ser: 0.99 mg/dL (ref 0.61–1.24)
GFR, Estimated: >60 mL/min (ref >60)
Glucose, Bld: 147 mg/dL — ABNORMAL HIGH (ref 70–99)
Potassium: 3.8 mmol/L (ref 3.5–5.1)
Sodium: 134 mmol/L — ABNORMAL LOW (ref 135–145)
Total Bilirubin: 2.3 mg/dL — ABNORMAL HIGH (ref 0.3–1.2)
Total Protein: 6.9 g/dL (ref 6.5–8.1)

## 2020-06-22 LAB — CBC WITH DIFFERENTIAL/PLATELET
Abs Immature Granulocytes: 0.04 10*3/uL (ref 0.00–0.07)
Basophils Absolute: 0 10*3/uL (ref 0.0–0.1)
Basophils Relative: 0 %
Eosinophils Absolute: 0 10*3/uL (ref 0.0–0.5)
Eosinophils Relative: 0 %
HCT: 36.3 % — ABNORMAL LOW (ref 39.0–52.0)
Hemoglobin: 12.4 g/dL — ABNORMAL LOW (ref 13.0–17.0)
Immature Granulocytes: 0 %
Lymphocytes Relative: 2 %
Lymphs Abs: 0.2 10*3/uL — ABNORMAL LOW (ref 0.7–4.0)
MCH: 34.3 pg — ABNORMAL HIGH (ref 26.0–34.0)
MCHC: 34.2 g/dL (ref 30.0–36.0)
MCV: 100.3 fL — ABNORMAL HIGH (ref 80.0–100.0)
Monocytes Absolute: 0.2 10*3/uL (ref 0.1–1.0)
Monocytes Relative: 1 %
Neutro Abs: 11.7 10*3/uL — ABNORMAL HIGH (ref 1.7–7.7)
Neutrophils Relative %: 97 %
Platelets: 155 10*3/uL (ref 150–400)
RBC: 3.62 MIL/uL — ABNORMAL LOW (ref 4.22–5.81)
RDW: 13.2 % (ref 11.5–15.5)
WBC: 12.1 10*3/uL — ABNORMAL HIGH (ref 4.0–10.5)
nRBC: 0 % (ref 0.0–0.2)

## 2020-06-22 LAB — URINALYSIS, ROUTINE W REFLEX MICROSCOPIC
Glucose, UA: NEGATIVE mg/dL
Ketones, ur: NEGATIVE mg/dL
Leukocytes,Ua: NEGATIVE
Nitrite: NEGATIVE
Protein, ur: NEGATIVE mg/dL
Specific Gravity, Urine: 1.015 (ref 1.005–1.030)
pH: 6 (ref 5.0–8.0)

## 2020-06-22 LAB — URINALYSIS, MICROSCOPIC (REFLEX): WBC, UA: NONE SEEN WBC/hpf (ref 0–5)

## 2020-06-22 LAB — PROTIME-INR
INR: 1.2 (ref 0.8–1.2)
Prothrombin Time: 15.6 seconds — ABNORMAL HIGH (ref 11.4–15.2)

## 2020-06-22 LAB — LIPASE, BLOOD: Lipase: 24 U/L (ref 11–51)

## 2020-06-22 LAB — RESP PANEL BY RT-PCR (FLU A&B, COVID) ARPGX2
Influenza A by PCR: NEGATIVE
Influenza B by PCR: NEGATIVE
SARS Coronavirus 2 by RT PCR: NEGATIVE

## 2020-06-22 LAB — ACETAMINOPHEN LEVEL: Acetaminophen (Tylenol), Serum: <10 ug/mL — ABNORMAL LOW (ref 10–30)

## 2020-06-22 MED ORDER — LEVOFLOXACIN IN D5W 750 MG/150ML IV SOLN
750.0000 mg | Freq: Once | INTRAVENOUS | Status: AC
  Administered 2020-06-22: 750 mg via INTRAVENOUS
  Filled 2020-06-22: qty 150

## 2020-06-22 MED ORDER — LEVOFLOXACIN IN D5W 750 MG/150ML IV SOLN
750.0000 mg | INTRAVENOUS | Status: DC
  Filled 2020-06-22: qty 150

## 2020-06-22 MED ORDER — LACTATED RINGERS IV BOLUS (SEPSIS)
500.0000 mL | Freq: Once | INTRAVENOUS | Status: AC
  Administered 2020-06-23: 500 mL via INTRAVENOUS

## 2020-06-22 MED ORDER — METRONIDAZOLE 500 MG/100ML IV SOLN
500.0000 mg | Freq: Once | INTRAVENOUS | Status: AC
  Administered 2020-06-23: 500 mg via INTRAVENOUS
  Filled 2020-06-22: qty 100

## 2020-06-22 NOTE — Progress Notes (Signed)
Pharmacy Antibiotic Note  Terry Gregory is a 87 y.o. male admitted on 06/22/2020 with intra-abdominal infection.  Pharmacy has been consulted for Levaquin dosing.  WBC 12.1, SCr wnl   Plan: -Levaquin 750 mg IV Q 24 hours  -Flagyl per MD -Monitor CBC, renal fx and clinical progress   Height: 6\' 3"  (190.5 cm) Weight: 90.7 kg (200 lb) IBW/kg (Calculated) : 84.5  Temp (24hrs), Avg:99.9 F (37.7 C), Min:99.9 F (37.7 C), Max:99.9 F (37.7 C)  Recent Labs  Lab 06/22/20 1849  WBC 12.1*  CREATININE 0.99    Estimated Creatinine Clearance: 51 mL/min (by C-G formula based on SCr of 0.99 mg/dL).    Allergies  Allergen Reactions  . Erythromycin Hives and Rash  . Penicillins Hives and Rash    .Marland KitchenHas patient had a PCN reaction causing immediate rash, facial/tongue/throat swelling, SOB or lightheadedness with hypotension: Yes Has patient had a PCN reaction causing severe rash involving mucus membranes or skin necrosis: No Has patient had a PCN reaction that required hospitalization No Has patient had a PCN reaction occurring within the last 10 years: No If all of the above answers are "NO", then may proceed with Cephalosporin use.     Antimicrobials this admission: Levaquin 5/11 >>  Flagyl 5/11 >>   Dose adjustments this admission:   Microbiology results:   Thank you for allowing pharmacy to be a part of this patient's care.  Albertina Parr, PharmD., BCPS, BCCCP Clinical Pharmacist Please refer to [facility] for unit-specific pharmacist

## 2020-06-22 NOTE — Progress Notes (Addendum)
87 year old male with hx of pericardial effusion, diverticulosis, and BPH, presents with 24 hours abdominal pain. Daughter brought patient in after near syncopal episode. Labs show T Bili 2.3, transaminitis, and WBC 12. Hx of cholecystectomy. Does not meet SIRS. CT belly - did not show a cause of of abdominal pain. [place] UQ - no dilatation of CBD. GI recommended fluids, antibiotics, and possible MRCP in the AM. Patient has pacemaker - so will have to come to [facility] for MRCP.     Dr. Laverta Baltimore did speak with [facility] initially, and then let them know that patient would be coming to [facility] instead of [facility].   [facility] hospitalists to assume care upon arrival to accepting facility.

## 2020-06-22 NOTE — ED Provider Notes (Signed)
Emergency Department Provider Note   I have reviewed the triage vital signs and the nursing notes.   HISTORY  Chief Complaint Abdominal Pain   HPI Terry Gregory is a 87 y.o. male with past medical history reviewed below presents to the emergency department with abdominal pain.  Patient arrives with his daughter at bedside.  Patient has been complaining of generalized abdominal discomfort for the past 8 hours but states symptoms have mostly resolved at this point.  The patient's daughter gave him some Gas-X this morning thinking it may be related but was monitoring symptoms at home when he apparently fell while sitting on the toilet.  He seemed more weak than normal.  She helped him get to his feet but states he still seemed to have lower energy than he normally does.  He continued complaining of abdominal pain without vomiting.  No diarrhea.  He denies hitting his head.  Patient tells me he did not fall but "went down" when trying to get his pants pulled up.  Denies actually hitting his head on the floor. Denies syncope. Does have history of appendectomy and cholecystectomy.    Past Medical History:  Diagnosis Date  . BPH (benign prostatic hyperplasia)   . Diverticulosis   . Pericardial effusion     Patient Active Problem List   Diagnosis Date Noted  . Near syncope 06/23/2020  . BPH (benign prostatic hyperplasia) 06/23/2020  . Elevated LFTs 06/22/2020  . Second degree AV block, Mobitz type I 11/03/2016  . Typical atrial flutter ([place]) 10/06/2015  . Pericardial effusion 09/30/2012  . RBBB and first degree atrioventricular block 09/30/2012  . HTN (hypertension) 09/30/2012    Past Surgical History:  Procedure Laterality Date  . APPENDECTOMY  1960  . CATARACT EXTRACTION  2009   both eyes  . CHOLECYSTECTOMY  2000  . INGUINAL HERNIA REPAIR  1980's  . PACEMAKER IMPLANT N/A 03/21/2020   Procedure: PACEMAKER IMPLANT;  Surgeon: Sanda Klein, MD;  Location: [facility];   Service: Cardiovascular;  Laterality: N/A;  . TOTAL HIP ARTHROPLASTY  2003   right  . WRIST SURGERY  2009   left    Allergies Erythromycin and Penicillins  Family History  Problem Relation Age of Onset  . Heart attack Father   . Stroke Mother   . Heart failure Brother   . Stroke Brother   . Diabetes Sister   . Cancer Sister     Social History Social History   Tobacco Use  . Smoking status: Never Smoker  . Smokeless tobacco: Never Used  Substance Use Topics  . Alcohol use: No  . Drug use: No    Review of Systems  Constitutional: No fever/chills. Generalized weakness.  Eyes: No visual changes. ENT: No sore throat. Cardiovascular: Denies chest pain. Respiratory: Denies shortness of breath. Gastrointestinal: Positive abdominal pain.  No nausea, no vomiting.  No diarrhea.  No constipation. Genitourinary: Negative for dysuria. Musculoskeletal: Negative for back pain. Skin: Negative for rash. Neurological: Negative for headaches, focal weakness or numbness 10-point ROS otherwise negative.  ____________________________________________   PHYSICAL EXAM:  VITAL SIGNS: ED Triage Vitals  Enc Vitals Group     BP 06/22/20 1845 137/67     Pulse Rate 06/22/20 1845 91     Resp 06/22/20 1845 16     Temp 06/22/20 1845 99.9 F (37.7 C)     Temp Source 06/22/20 1845 Oral     SpO2 06/22/20 1845 99 %     Weight 06/22/20  1844 200 lb (90.7 kg)     Height 06/22/20 1844 6\' 3"  (1.905 m)   Constitutional: Alert and oriented. Well appearing and in no acute distress. Eyes: Conjunctivae are normal. Head: Atraumatic. Nose: No congestion/rhinnorhea. Mouth/Throat: Mucous membranes are moist.  Neck: No stridor.  Cardiovascular: Normal rate, regular rhythm. Good peripheral circulation. Grossly normal heart sounds.   Respiratory: Normal respiratory effort.  No retractions. Lungs CTAB. Gastrointestinal: Soft and nontender. No distention.  Musculoskeletal: No lower extremity tenderness  nor edema. No gross deformities of extremities. Neurologic:  Normal speech and language. No gross focal neurologic deficits are appreciated.  Skin:  Skin is warm, dry and intact. No rash noted.   ____________________________________________   LABS (all labs ordered are listed, but only abnormal results are displayed)  Labs Reviewed  COMPREHENSIVE METABOLIC PANEL - Abnormal; Notable for the following components:      Result Value   Sodium 134 (*)    Glucose, Bld 147 (*)    BUN 29 (*)    Calcium 8.6 (*)    AST 1,074 (*)    ALT 485 (*)    Alkaline Phosphatase 211 (*)    Total Bilirubin 2.3 (*)    All other components within normal limits  CBC WITH DIFFERENTIAL/PLATELET - Abnormal; Notable for the following components:   WBC 12.1 (*)    RBC 3.62 (*)    Hemoglobin 12.4 (*)    HCT 36.3 (*)    MCV 100.3 (*)    MCH 34.3 (*)    Neutro Abs 11.7 (*)    Lymphs Abs 0.2 (*)    All other components within normal limits  URINALYSIS, ROUTINE W REFLEX MICROSCOPIC - Abnormal; Notable for the following components:   Hgb urine dipstick SMALL (*)    Bilirubin Urine SMALL (*)    All other components within normal limits  PROTIME-INR - Abnormal; Notable for the following components:   Prothrombin Time 15.6 (*)    All other components within normal limits  ACETAMINOPHEN LEVEL - Abnormal; Notable for the following components:   Acetaminophen (Tylenol), Serum <10 (*)    All other components within normal limits  URINALYSIS, MICROSCOPIC (REFLEX) - Abnormal; Notable for the following components:   Bacteria, UA RARE (*)    All other components within normal limits  CBC - Abnormal; Notable for the following components:   WBC 19.6 (*)    RBC 3.22 (*)    Hemoglobin 10.8 (*)    HCT 32.5 (*)    MCV 100.9 (*)    Platelets 130 (*)    All other components within normal limits  LACTIC ACID, PLASMA - Abnormal; Notable for the following components:   Lactic Acid, Venous 2.1 (*)    All other components  within normal limits  COMPREHENSIVE METABOLIC PANEL - Abnormal; Notable for the following components:   Glucose, Bld 115 (*)    BUN 29 (*)    Calcium 8.4 (*)    Total Protein 5.6 (*)    Albumin 3.0 (*)    AST 713 (*)    ALT 514 (*)    Alkaline Phosphatase 150 (*)    Total Bilirubin 3.1 (*)    GFR, Estimated 55 (*)    All other components within normal limits  RESP PANEL BY RT-PCR (FLU A&B, COVID) ARPGX2  LIPASE, BLOOD  HEPATITIS PANEL, ACUTE  MAGNESIUM  LACTIC ACID, PLASMA  COMPREHENSIVE METABOLIC PANEL   ____________________________________________  RADIOLOGY  CT imaging and [place] reviewed.  ____________________________________________  PROCEDURES  Procedure(s) performed:   Procedures  None  ____________________________________________   INITIAL IMPRESSION / ASSESSMENT AND PLAN / ED COURSE  Pertinent labs & imaging results that were available during my care of the patient were reviewed by me and considered in my medical decision making (see chart for details).   Patient presents to the emergency department with abdominal pain earlier but none currently.  His abdomen is diffusely soft and nontender.  He has prior history of appendectomy and cholecystectomy.  His lab work from triage shows AST of 1074 and ALT of 485.  Bilirubin is elevated to 2.3.  He has no prior history or active history of alcohol use/abuse.  No new medications.  Have added an INR along with hepatitis panel and Tylenol level.  Do not suspect acute overdose.  There is an IV contrast shortage nationally and I have opted to start with a CT scan of the abdomen and pelvis without contrast.   10:41 PM  Spoke with Dr. Cristina Gong regarding the patient's ED work-up.  He agrees with plan for admission and advises starting antibiotics in case this may represent an early cholangitis picture.  Perhaps the patient had a stone but that is since passed.  The ultrasound and CT imaging is largely unremarkable. Can consider  MRCP. Patient has an MRI compatible pacemaker.   Discussed patient's case with [facility] to request admission. Patient and family (if present) updated with plan. Care transferred to [facility] service.  I reviewed all nursing notes, vitals, pertinent old records, EKGs, labs, imaging (as available).  ____________________________________________  FINAL CLINICAL IMPRESSION(S) / ED DIAGNOSES  Final diagnoses:  Elevated LFTs  Generalized abdominal pain     MEDICATIONS GIVEN DURING THIS VISIT:  Medications  metroNIDAZOLE (FLAGYL) IVPB 500 mg (500 mg Intravenous New Bag/Given 06/23/20 2159)  0.9 %  sodium chloride infusion (100 mL/hr Intravenous New Bag/Given 06/23/20 0922)  finasteride (PROSCAR) tablet 5 mg (5 mg Oral Given 06/23/20 1635)  Levofloxacin (LEVAQUIN) IVPB 250 mg (250 mg Intravenous New Bag/Given 06/23/20 2309)  lactated ringers bolus 500 mL (500 mLs Intravenous New Bag/Given 06/23/20 0022)  levofloxacin (LEVAQUIN) IVPB 750 mg (0 mg Intravenous Stopped 06/23/20 0020)  metroNIDAZOLE (FLAGYL) IVPB 500 mg (500 mg Intravenous New Bag/Given 06/23/20 0022)  gadobutrol (GADAVIST) 1 MMOL/ML injection 9 mL (9 mLs Intravenous Contrast Given 06/23/20 1537)    Note:  This document was prepared using Dragon voice recognition software and may include unintentional dictation errors.  Nanda Quinton, MD, [facility]    Jayro Mcmath, [facility], MD 06/24/20 [phone]

## 2020-06-22 NOTE — ED Triage Notes (Signed)
C/o abd pain x 8 hrs  Also c/o generalized weakness., fall in the bathroom this am

## 2020-06-23 ENCOUNTER — Inpatient Hospital Stay (HOSPITAL_COMMUNITY): Payer: Medicare Other

## 2020-06-23 DIAGNOSIS — N400 Enlarged prostate without lower urinary tract symptoms: Secondary | ICD-10-CM | POA: Diagnosis present

## 2020-06-23 DIAGNOSIS — R7989 Other specified abnormal findings of blood chemistry: Secondary | ICD-10-CM | POA: Diagnosis not present

## 2020-06-23 DIAGNOSIS — Z8249 Family history of ischemic heart disease and other diseases of the circulatory system: Secondary | ICD-10-CM | POA: Diagnosis not present

## 2020-06-23 DIAGNOSIS — R1310 Dysphagia, unspecified: Secondary | ICD-10-CM | POA: Diagnosis present

## 2020-06-23 DIAGNOSIS — Z881 Allergy status to other antibiotic agents status: Secondary | ICD-10-CM | POA: Diagnosis not present

## 2020-06-23 DIAGNOSIS — I10 Essential (primary) hypertension: Secondary | ICD-10-CM | POA: Diagnosis present

## 2020-06-23 DIAGNOSIS — Z79899 Other long term (current) drug therapy: Secondary | ICD-10-CM | POA: Diagnosis not present

## 2020-06-23 DIAGNOSIS — K5900 Constipation, unspecified: Secondary | ICD-10-CM | POA: Diagnosis present

## 2020-06-23 DIAGNOSIS — R55 Syncope and collapse: Secondary | ICD-10-CM | POA: Diagnosis present

## 2020-06-23 DIAGNOSIS — Z880 Allergy status to penicillin: Secondary | ICD-10-CM | POA: Diagnosis not present

## 2020-06-23 DIAGNOSIS — R1084 Generalized abdominal pain: Secondary | ICD-10-CM | POA: Diagnosis present

## 2020-06-23 DIAGNOSIS — Z96641 Presence of right artificial hip joint: Secondary | ICD-10-CM | POA: Diagnosis present

## 2020-06-23 DIAGNOSIS — Z20822 Contact with and (suspected) exposure to covid-19: Secondary | ICD-10-CM | POA: Diagnosis present

## 2020-06-23 DIAGNOSIS — Z7982 Long term (current) use of aspirin: Secondary | ICD-10-CM | POA: Diagnosis not present

## 2020-06-23 DIAGNOSIS — Z950 Presence of cardiac pacemaker: Secondary | ICD-10-CM | POA: Diagnosis not present

## 2020-06-23 DIAGNOSIS — R600 Localized edema: Secondary | ICD-10-CM | POA: Diagnosis present

## 2020-06-23 DIAGNOSIS — Z9049 Acquired absence of other specified parts of digestive tract: Secondary | ICD-10-CM | POA: Diagnosis not present

## 2020-06-23 DIAGNOSIS — H9193 Unspecified hearing loss, bilateral: Secondary | ICD-10-CM | POA: Diagnosis present

## 2020-06-23 DIAGNOSIS — K8051 Calculus of bile duct without cholangitis or cholecystitis with obstruction: Secondary | ICD-10-CM | POA: Diagnosis present

## 2020-06-23 DIAGNOSIS — W1830XA Fall on same level, unspecified, initial encounter: Secondary | ICD-10-CM | POA: Diagnosis present

## 2020-06-23 LAB — HEPATITIS PANEL, ACUTE
HCV Ab: NONREACTIVE
Hep A IgM: NONREACTIVE
Hep B C IgM: NONREACTIVE
Hepatitis B Surface Ag: NONREACTIVE

## 2020-06-23 LAB — COMPREHENSIVE METABOLIC PANEL
ALT: 514 U/L — ABNORMAL HIGH (ref 0–44)
AST: 713 U/L — ABNORMAL HIGH (ref 15–41)
Albumin: 3 g/dL — ABNORMAL LOW (ref 3.5–5.0)
Alkaline Phosphatase: 150 U/L — ABNORMAL HIGH (ref 38–126)
Anion gap: 9 (ref 5–15)
BUN: 29 mg/dL — ABNORMAL HIGH (ref 8–23)
CO2: 22 mmol/L (ref 22–32)
Calcium: 8.4 mg/dL — ABNORMAL LOW (ref 8.9–10.3)
Chloride: 105 mmol/L (ref 98–111)
Creatinine, Ser: 1.2 mg/dL (ref 0.61–1.24)
GFR, Estimated: 55 mL/min — ABNORMAL LOW (ref >60)
Glucose, Bld: 115 mg/dL — ABNORMAL HIGH (ref 70–99)
Potassium: 4.2 mmol/L (ref 3.5–5.1)
Sodium: 136 mmol/L (ref 135–145)
Total Bilirubin: 3.1 mg/dL — ABNORMAL HIGH (ref 0.3–1.2)
Total Protein: 5.6 g/dL — ABNORMAL LOW (ref 6.5–8.1)

## 2020-06-23 LAB — CBC
HCT: 32.5 % — ABNORMAL LOW (ref 39.0–52.0)
Hemoglobin: 10.8 g/dL — ABNORMAL LOW (ref 13.0–17.0)
MCH: 33.5 pg (ref 26.0–34.0)
MCHC: 33.2 g/dL (ref 30.0–36.0)
MCV: 100.9 fL — ABNORMAL HIGH (ref 80.0–100.0)
Platelets: 130 10*3/uL — ABNORMAL LOW (ref 150–400)
RBC: 3.22 MIL/uL — ABNORMAL LOW (ref 4.22–5.81)
RDW: 13.4 % (ref 11.5–15.5)
WBC: 19.6 10*3/uL — ABNORMAL HIGH (ref 4.0–10.5)
nRBC: 0 % (ref 0.0–0.2)

## 2020-06-23 LAB — MAGNESIUM: Magnesium: 1.9 mg/dL (ref 1.7–2.4)

## 2020-06-23 LAB — LACTIC ACID, PLASMA
Lactic Acid, Venous: 2.1 mmol/L (ref 0.5–1.9)
Lactic Acid, Venous: 0.9 mmol/L (ref 0.5–1.9)

## 2020-06-23 MED ORDER — LEVOFLOXACIN IN D5W 250 MG/50ML IV SOLN
250.0000 mg | INTRAVENOUS | Status: DC
  Administered 2020-06-23 – 2020-06-25 (×3): 250 mg via INTRAVENOUS
  Filled 2020-06-23 (×3): qty 50

## 2020-06-23 MED ORDER — SODIUM CHLORIDE 0.9 % IV SOLN
INTRAVENOUS | Status: AC
  Administered 2020-06-23: 100 mL/h via INTRAVENOUS

## 2020-06-23 MED ORDER — FINASTERIDE 5 MG PO TABS
5.0000 mg | ORAL_TABLET | Freq: Every day | ORAL | Status: DC
  Administered 2020-06-23 – 2020-06-26 (×4): 5 mg via ORAL
  Filled 2020-06-23 (×5): qty 1

## 2020-06-23 MED ORDER — SODIUM CHLORIDE 0.9 % IV SOLN: INTRAVENOUS | Status: DC

## 2020-06-23 MED ORDER — METRONIDAZOLE 500 MG/100ML IV SOLN
500.0000 mg | Freq: Three times a day (TID) | INTRAVENOUS | Status: DC
  Administered 2020-06-23 – 2020-06-26 (×10): 500 mg via INTRAVENOUS
  Filled 2020-06-23 (×10): qty 100

## 2020-06-23 MED ORDER — GADOBUTROL 1 MMOL/ML IV SOLN
9.0000 mL | Freq: Once | INTRAVENOUS | Status: AC | PRN
  Administered 2020-06-23: 9 mL via INTRAVENOUS

## 2020-06-23 NOTE — Plan of Care (Signed)

## 2020-06-23 NOTE — Progress Notes (Signed)
Date and time results received: 06/23/20  @0720   Critical Value: Lactic Acid 2.1  Name of Provider Notified: Dr. Lonny Prude  Paged on-call, provider day shift nurse with information

## 2020-06-23 NOTE — Progress Notes (Signed)
PROGRESS NOTE    Terry Gregory  WYO:[id number] DOB: August 29, 1921 DOA: 06/22/2020 PCP: Burnard Bunting, MD   Brief Narrative:  HPI: Terry Gregory is a 87 y.o. 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  In the ED, afebrile.  Not tachycardic or hypotensive.  Not hypoxic.  Labs showing WBC 12.1, hemoglobin 12.4 (stable compared to prior labs), platelet count 155K.  Sodium 134, potassium 3.8, chloride 101, bicarb 23, BUN 29, creatinine 0.9, glucose 147.  AST 1074, ALT 485, alk phos 211, T bili 2.3.  Lipase normal.  INR 1.2.  Acute hepatitis panel pending.  Acetaminophen level undetectable.  UA not suggestive of infection.  Screening COVID and influenza PCR negative.  CT abdomen did not reveal cause of acute abdominal pain and elevated LFTs.  Right upper quadrant ultrasound showing no acute abnormality. ED physician discussed the case with Dr. Cristina Gong from gastroenterology who recommended admission for antibiotics and possible MRCP in the morning.  Since patient has a pacemaker, he was transferred to [facility] for MRCP.  Patient was given Levaquin, metronidazole, and a 500 cc LR bolus in the ED.  Patient reports 1 day history of epigastric abdominal pain which he thought was due to him having gas.  He had a bowel movement yesterday but the pain persisted which made him come into the ED to be evaluated.  Denies nausea or vomiting.  Denies fevers or chills.  Denies chest pain or shortness of breath.  States his abdominal pain has now resolved.  No additional history could be  obtained from him.   Assessment & Plan:   Principal Problem:   Elevated LFTs Active Problems:   HTN (hypertension)   Near syncope   BPH (benign prostatic hyperplasia)  Acute abdominal pain/elevated LFTs: (AST 1074, ALT 485, alk phos 211, T bili 2.3).  Lipase normal.  Acetaminophen level undetectable.  CT abdomen did not reveal cause of acute abdominal pain and elevated LFTs.  Right upper quadrant ultrasound showing no acute abnormality.  Seen by GI.  Plan for MRCP today.-Continue Levaquin and metronidazole.  IV fluid hydration, keep n.p.o. Check lactic acid level.  Acute hepatitis panel negative.  Abdominal pain has resolved  Near syncope: Patient had a near syncopal episode at home which was reported by family.  Although this could be due to abdominal pain, he does have a history of extreme bradycardia due to second-degree AV block for which he went underwent pacemaker implantation in February 2022.  Not bradycardic or hypotensive in the ED.  Hypertension: Blood pressure low normal.    BPH: Resume finasteride.  DVT prophylaxis: SCDs Start: 06/23/20 0359   Code Status: Full Code  Family Communication: None present at bedside.  Plan of care discussed with patient in length and he verbalized understanding and agreed with it.  GI/Dr. Therisa Doyne discussed with the daughter  Status is: Inpatient  Remains inpatient appropriate because:Ongoing diagnostic testing needed not appropriate for outpatient work up   Dispo: The patient is from: Home              Anticipated d/c is to: Home  Patient currently is not medically stable to d/c.   Difficult to place patient No        Estimated body mass index is 25 kg/m as calculated from the following:   Height as of this encounter: '6\' 3"'  (1.905 m).   Weight as of this encounter: 90.7 kg.      Nutritional status:               Consultants:   GI  Procedures:   None  Antimicrobials:  Anti-infectives (From  admission, onward)   Start     Dose/Rate Route Frequency Ordered Stop   06/23/20 2200  levofloxacin (LEVAQUIN) IVPB 750 mg        750 mg 100 mL/hr over 90 Minutes Intravenous Every 24 hours 06/22/20 2240     06/23/20 0600  metroNIDAZOLE (FLAGYL) IVPB 500 mg        500 mg 100 mL/hr over 60 Minutes Intravenous Every 8 hours 06/23/20 0401     06/22/20 2230  levofloxacin (LEVAQUIN) IVPB 750 mg        750 mg 100 mL/hr over 90 Minutes Intravenous  Once 06/22/20 2222 06/23/20 0020   06/22/20 2230  metroNIDAZOLE (FLAGYL) IVPB 500 mg        500 mg 100 mL/hr over 60 Minutes Intravenous  Once 06/22/20 2222 06/23/20 0122         Subjective: Patient seen and examined.  He is much better.  No abdominal pain.  Fully alert and oriented.  Objective: Vitals:   06/23/20 0020 06/23/20 0110 06/23/20 0457 06/23/20 0758  BP: 109/61 (!) 110/59 (!) 101/59 (!) 107/55  Pulse: 77 66  78  Resp: '14 16 18 17  ' Temp: 98.8 F (37.1 C) 97.8 F (36.6 C) 98.5 F (36.9 C) (!) 97.5 F (36.4 C)  TempSrc: Oral Oral Oral Oral  SpO2: 98% 98%  95%  Weight:      Height:        Intake/Output Summary (Last 24 hours) at 06/23/2020 1318 Last data filed at 06/23/2020 0743 Gross per 24 hour  Intake 468.57 ml  Output 300 ml  Net 168.57 ml   Filed Weights   06/22/20 1844  Weight: 90.7 kg    Examination:  General exam: Appears calm and comfortable  Respiratory system: Clear to auscultation. Respiratory effort normal. Cardiovascular system: S1 & S2 heard, RRR. No JVD, murmurs, rubs, gallops or clicks. No pedal edema. Gastrointestinal system: Abdomen is nondistended, soft and nontender. No organomegaly or masses felt. Normal bowel sounds heard. Central nervous system: Alert and oriented. No focal neurological deficits. Extremities: Symmetric 5 x 5 power. Skin: No rashes, lesions or ulcers Psychiatry: Judgement and insight appear normal. Mood & affect appropriate.    Data Reviewed: I have personally reviewed  following labs and imaging studies  CBC: Recent Labs  Lab 06/22/20 1849 06/23/20 0611  WBC 12.1* 19.6*  NEUTROABS 11.7*  --   HGB 12.4* 10.8*  HCT 36.3* 32.5*  MCV 100.3* 100.9*  PLT 155 867*   Basic Metabolic Panel: Recent Labs  Lab 06/22/20 1849 06/23/20 0611  NA 134* 136  K 3.8 4.2  CL 101 105  CO2 23 22  GLUCOSE 147* 115*  BUN 29* 29*  CREATININE 0.99 1.20  CALCIUM 8.6* 8.4*  MG  --  1.9   GFR: Estimated Creatinine Clearance: 42.1 mL/min (by C-G formula based on SCr of 1.2 mg/dL). Liver Function Tests: Recent Labs  Lab 06/22/20 1849 06/23/20 0611  AST 1,074* 713*  ALT 485* 514*  ALKPHOS 211* 150*  BILITOT 2.3* 3.1*  PROT 6.9 5.6*  ALBUMIN 3.6 3.0*   Recent Labs  Lab 06/22/20 1849  LIPASE 24   No results for input(s): AMMONIA in the last 168 hours. Coagulation Profile: Recent Labs  Lab 06/22/20 1952  INR 1.2   Cardiac Enzymes: No results for input(s): CKTOTAL, CKMB, CKMBINDEX, TROPONINI in the last 168 hours. BNP (last 3 results) No results for input(s): PROBNP in the last 8760 hours. HbA1C: No results for input(s): HGBA1C in the last 72 hours. CBG: No results for input(s): GLUCAP in the last 168 hours. Lipid Profile: No results for input(s): CHOL, HDL, LDLCALC, TRIG, CHOLHDL, LDLDIRECT in the last 72 hours. Thyroid Function Tests: No results for input(s): TSH, T4TOTAL, FREET4, T3FREE, THYROIDAB in the last 72 hours. Anemia Panel: No results for input(s): VITAMINB12, FOLATE, FERRITIN, TIBC, IRON, RETICCTPCT in the last 72 hours. Sepsis Labs: Recent Labs  Lab 06/23/20 8242  LATICACIDVEN 2.1*    Recent Results (from the past 240 hour(s))  Resp Panel by RT-PCR (Flu A&B, Covid) Nasopharyngeal Swab     Status: None   Collection Time: 06/22/20 10:40 PM   Specimen: Nasopharyngeal Swab; Nasopharyngeal(NP) swabs in vial transport medium  Result Value Ref Range Status   SARS Coronavirus 2 by RT PCR NEGATIVE NEGATIVE Final    Comment:  (NOTE) SARS-CoV-2 target nucleic acids are NOT DETECTED.  The SARS-CoV-2 RNA is generally detectable in upper respiratory specimens during the acute phase of infection. The lowest concentration of SARS-CoV-2 viral copies this assay can detect is 138 copies/mL. A negative result does not preclude SARS-Cov-2 infection and should not be used as the sole basis for treatment or other patient management decisions. A negative result may occur with  improper specimen collection/handling, submission of specimen other than nasopharyngeal swab, presence of viral mutation(s) within the areas targeted by this assay, and inadequate number of viral copies(<138 copies/mL). A negative result must be combined with clinical observations, patient history, and epidemiological information. The expected result is Negative.  Fact Sheet for Patients:  EntrepreneurPulse.com.au  Fact Sheet for Healthcare Providers:  IncredibleEmployment.be  This test is no t yet approved or cleared by the [place] FDA and  has been authorized for detection and/or diagnosis of SARS-CoV-2 by FDA under an Emergency Use Authorization (EUA). This EUA will remain  in effect (meaning this test can be used) for the duration of the COVID-19 declaration under Section 564(b)(1) of the Act, 21 U.S.C.section 360bbb-3(b)(1), unless the authorization is terminated  or revoked sooner.       Influenza A by PCR NEGATIVE NEGATIVE Final   Influenza B by PCR NEGATIVE NEGATIVE Final    Comment: (NOTE) The Xpert Xpress SARS-CoV-2/FLU/RSV plus assay is intended as an aid in the diagnosis of influenza from Nasopharyngeal swab specimens and should not be used as a sole basis for treatment. Nasal washings and aspirates are unacceptable for Xpert Xpress SARS-CoV-2/FLU/RSV testing.  Fact Sheet for Patients: EntrepreneurPulse.com.au  Fact Sheet for Healthcare  Providers: IncredibleEmployment.be  This test is not yet approved or cleared by the [place] FDA and has been authorized for detection and/or diagnosis of SARS-CoV-2 by FDA under an Emergency Use Authorization (EUA). This EUA will remain in effect (meaning this test can be used) for the duration of the COVID-19 declaration under Section 564(b)(1) of the Act, 21 U.S.C. section 360bbb-3(b)(1), unless the authorization is terminated or revoked.  Performed at [facility], [address],  [place], [place] [address]       Radiology Studies: CT ABDOMEN PELVIS WO CONTRAST  Result Date: 06/22/2020 CLINICAL DATA:  87 year old with acute abdominal pain. Weakness. Fall in the bathroom this morning. EXAM: CT ABDOMEN AND PELVIS WITHOUT CONTRAST TECHNIQUE: Multidetector CT imaging of the abdomen and pelvis was performed following the standard protocol without IV contrast. COMPARISON:  CT 06/23/2014 FINDINGS: Mild motion artifact limitations. Lower chest: No acute airspace disease or pleural effusion. Pacemaker wires partially included. Small hiatal hernia with mild distal esophageal wall thickening. Hepatobiliary: No focal hepatic abnormality on noncontrast exam. Cholecystectomy without biliary dilatation. Pancreas: No ductal dilatation or inflammation. Spleen: Normal in size without focal abnormality. Adrenals/Urinary Tract: No adrenal nodule or hemorrhage. No hydronephrosis or renal calculi. Bilateral renal parenchymal thinning and perinephric edema, chronic. Previous right renal collecting system duplication was better assessed on prior contrast-enhanced exam. No evidence of focal renal lesion. Urinary bladder is partially distended, streak artifact from right hip arthroplasty partially obscures detailed assessment. Stomach/Bowel: Small hiatal hernia with mild wall thickening of the distal esophagus. Stomach is partially distended, grossly unremarkable. There is a duodenal  diverticulum. Scattered fecalization of small bowel contents without abnormal distension or obstruction. Appendix not definitively visualized, no appendicitis. Diffuse colonic diverticulosis. No evidence of diverticulitis. Moderate stool burden with colonic redundancy. Rectum is distended with stool, 8.4 cm. No definite associated wall thickening. There is no colonic wall thickening or pericolonic edema. Vascular/Lymphatic: Aortic atherosclerosis. No aortic aneurysm. 13 mm left periaortic node, series 2, image 33. 10 mm anterior periaortic node, series 2, image 38. Multiple additional small retroperitoneal nodes are not enlarged by size criteria. Few scattered mesenteric nodes. There is mild generalized edema of the retroperitoneal fat. This does not appear centered on the aorta. No definite pelvic adenopathy, evaluation of the right pelvis partially obscured by streak artifact from right hip arthroplasty. Reproductive: Enlarged prostate gland spanning 7.2 x 5.9 x 5.2 cm (volume = 120 cm^3). Other: No free air or free fluid. Laxity of the anterior abdominal wall musculature without frank hernia. There is fat in the left greater than right inguinal canal. Musculoskeletal: Right hip arthroplasty. No acute fracture of the included spine, ribs, or pelvis. No focal bone lesion. No focal body wall soft tissue abnormality. IMPRESSION: 1. Mild generalized edema of the retroperitoneal fat with scattered mildly enlarged retroperitoneal nodes. This is of uncertain significance. Possibility of retroperitoneal fibrosis is raised. Inflammatory changes do not appear centered on the aorta are vascular structures, and vasculitis or acute vascular process is felt unlikely. 2. Moderate stool burden with colonic redundancy and fecalization of small bowel contents, consistent with slow transit. No bowel obstruction or inflammation. 3. Colonic diverticulosis without diverticulitis. 4. Enlarged prostate gland. 5. Small hiatal hernia with  mild distal esophageal wall thickening, can be seen with reflux or esophagitis. Aortic Atherosclerosis (ICD10-I70.0). Electronically Signed   By: Keith Rake M.D.   On: 06/22/2020 20:18   US Abdomen Limited RUQ (LIVER/GB)  Result Date: 06/22/2020 CLINICAL DATA:  Elevated LFTs EXAM: ULTRASOUND ABDOMEN LIMITED RIGHT UPPER QUADRANT COMPARISON:  CT from earlier in the same day. FINDINGS: Gallbladder: Surgically removed Common bile duct: Diameter: 3.1 mm. Liver: No focal lesion identified. Within normal limits in parenchymal echogenicity. Portal vein is patent on color Doppler imaging with normal direction of blood flow towards the liver. Other: None. IMPRESSION: Status post cholecystectomy.  No other focal abnormality is noted. Electronically Signed   By: Inez Catalina M.D.   On: 06/22/2020 21:33  Scheduled Meds: Continuous Infusions: . sodium chloride 100 mL/hr (06/23/20 0922)  . levofloxacin (LEVAQUIN) IV    . metronidazole 500 mg (06/23/20 0508)     LOS: 0 days   Time spent: 32 minutes   Darliss Cheney, MD [facility]  06/23/2020, 1:18 PM   How to contact the [facility] Attending or Consulting provider [place] or covering provider during after hours [place], for this patient?  1. Check the care team in [facility] and look for a) attending/consulting [facility] provider listed and b) the [facility] team listed. Page or secure chat [phone]. 2. Log into [email or web address] and use [place]'s universal password to access. If you do not have the password, please contact the hospital operator. 3. Locate the [facility] provider you are looking for under [facility] and page to a number that you can be directly reached. 4. If you still have difficulty reaching the provider, please page the [facility] (Director on Call) for the Hospitalists listed on amion for assistance.

## 2020-06-23 NOTE — H&P (Addendum)
History and Physical    Terry Gregory WUJ:[id number] DOB: 1921-07-07 DOA: 06/22/2020  PCP: Burnard Bunting, MD Patient coming from: [facility]  Chief Complaint: Abdominal pain  HPI: Terry Gregory is a 87 y.o. 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  In the ED, afebrile.  Not tachycardic or hypotensive.  Not hypoxic.  Labs showing WBC 12.1, hemoglobin 12.4 (stable compared to prior labs), platelet count 155K.  Sodium 134, potassium 3.8, chloride 101, bicarb 23, BUN 29, creatinine 0.9, glucose 147.  AST 1074, ALT 485, alk phos 211, T bili 2.3.  Lipase normal.  INR 1.2.  Acute hepatitis panel pending.  Acetaminophen level undetectable.  UA not suggestive of infection.  Screening COVID and influenza PCR negative.  CT abdomen did not reveal cause of acute abdominal pain and elevated LFTs.  Right upper quadrant ultrasound showing no acute abnormality. ED physician discussed the case with Dr. Cristina Gong from gastroenterology who recommended admission for antibiotics and possible MRCP in the morning.  Since patient has a pacemaker, he was transferred to [facility] for MRCP.  Patient was given Levaquin, metronidazole, and a 500 cc LR bolus in the ED.  Patient reports 1 day history of epigastric abdominal pain which he thought was due to him having gas.  He had a bowel movement yesterday but the pain persisted which made him come into the ED to be evaluated.  Denies nausea or vomiting.  Denies fevers or chills.  Denies chest pain or shortness of breath.  States his abdominal pain has  now resolved.  No additional history could be obtained from him.  Review of Systems:  All systems reviewed and apart from history of presenting illness, are negative.  Past Medical History:  Diagnosis Date  . BPH (benign prostatic hyperplasia)   . Diverticulosis   . Pericardial effusion     Past Surgical History:  Procedure Laterality Date  . APPENDECTOMY  1960  . CATARACT EXTRACTION  2009   both eyes  . CHOLECYSTECTOMY  2000  . INGUINAL HERNIA REPAIR  1980's  . PACEMAKER IMPLANT N/A 03/21/2020   Procedure: PACEMAKER IMPLANT;  Surgeon: Sanda Klein, MD;  Location: [place] [facility];  Service: Cardiovascular;  Laterality: N/A;  . TOTAL HIP ARTHROPLASTY  2003   right  . WRIST SURGERY  2009   left     reports that he has never smoked. He has never used smokeless tobacco. He reports that he does not drink alcohol and does not use drugs.  Allergies  Allergen Reactions  . Erythromycin Hives and Rash  . Penicillins Hives and Rash    .Marland KitchenHas patient had a PCN reaction causing immediate rash, facial/tongue/throat swelling, SOB or lightheadedness with hypotension: Yes Has patient had a PCN reaction causing severe rash involving mucus membranes or skin necrosis: No Has patient had a PCN reaction that required hospitalization No Has patient had a PCN reaction occurring within the last 10 years: No If all of the above answers are "NO", then may proceed with Cephalosporin use.     Family History  Problem Relation Age of Onset  . Heart attack Father   . Stroke Mother   .  Heart failure Brother   . Stroke Brother   . Diabetes Sister   . Cancer Sister     Prior to Admission medications   Medication Sig Start Date End Date Taking? Authorizing Provider  aspirin EC 81 MG tablet Take 81 mg by mouth daily.   Yes [provider]  finasteride (PROSCAR) 5 MG tablet Take 5 mg by mouth daily. 09/27/17  Yes [provider]    Physical Exam: Vitals:   06/22/20 2345  06/23/20 0000 06/23/20 0020 06/23/20 0110  BP: 120/68 110/63 109/61 (!) 110/59  Pulse: 86 79 77 66  Resp: (!) '21 19 14 16  ' Temp:   98.8 F (37.1 C) 97.8 F (36.6 C)  TempSrc:   Oral Oral  SpO2: 97% 96% 98% 98%  Weight:      Height:        Physical Exam Constitutional:      General: He is not in acute distress. HENT:     Head: Normocephalic and atraumatic.  Eyes:     Extraocular Movements: Extraocular movements intact.     Conjunctiva/sclera: Conjunctivae normal.  Cardiovascular:     Rate and Rhythm: Normal rate and regular rhythm.     Pulses: Normal pulses.  Pulmonary:     Effort: Pulmonary effort is normal. No respiratory distress.     Breath sounds: No wheezing or rales.  Abdominal:     General: Bowel sounds are normal. There is no distension.     Palpations: Abdomen is soft.     Tenderness: There is no abdominal tenderness. There is no guarding or rebound.  Musculoskeletal:     Cervical back: Normal range of motion and neck supple.     Right lower leg: Edema present.     Left lower leg: Edema present.     Comments: +3 pitting edema of bilateral lower extremities  Skin:    General: Skin is warm and dry.  Neurological:     Mental Status: He is alert and oriented to person, place, and time.     Labs on Admission: I have personally reviewed following labs and imaging studies  CBC: Recent Labs  Lab 06/22/20 1849  WBC 12.1*  NEUTROABS 11.7*  HGB 12.4*  HCT 36.3*  MCV 100.3*  PLT 211   Basic Metabolic Panel: Recent Labs  Lab 06/22/20 1849  NA 134*  K 3.8  CL 101  CO2 23  GLUCOSE 147*  BUN 29*  CREATININE 0.99  CALCIUM 8.6*   GFR: Estimated Creatinine Clearance: 51 mL/min (by C-G formula based on SCr of 0.99 mg/dL). Liver Function Tests: Recent Labs  Lab 06/22/20 1849  AST 1,074*  ALT 485*  ALKPHOS 211*  BILITOT 2.3*  PROT 6.9  ALBUMIN 3.6   Recent Labs  Lab 06/22/20 1849  LIPASE 24   No results for input(s): AMMONIA in the last 168  hours. Coagulation Profile: Recent Labs  Lab 06/22/20 1952  INR 1.2   Cardiac Enzymes: No results for input(s): CKTOTAL, CKMB, CKMBINDEX, TROPONINI in the last 168 hours. BNP (last 3 results) No results for input(s): PROBNP in the last 8760 hours. HbA1C: No results for input(s): HGBA1C in the last 72 hours. CBG: No results for input(s): GLUCAP in the last 168 hours. Lipid Profile: No results for input(s): CHOL, HDL, LDLCALC, TRIG, CHOLHDL, LDLDIRECT in the last 72 hours. Thyroid Function Tests: No results for input(s): TSH, T4TOTAL, FREET4, T3FREE, THYROIDAB in the last 72 hours. Anemia Panel: No results for input(s): VITAMINB12, FOLATE,  FERRITIN, TIBC, IRON, RETICCTPCT in the last 72 hours. Urine analysis:    Component Value Date/Time   COLORURINE YELLOW 06/22/2020 1952   APPEARANCEUR CLEAR 06/22/2020 1952   LABSPEC 1.015 06/22/2020 1952   PHURINE 6.0 06/22/2020 1952   GLUCOSEU NEGATIVE 06/22/2020 1952   HGBUR SMALL (A) 06/22/2020 1952   BILIRUBINUR SMALL (A) 06/22/2020 1952   KETONESUR NEGATIVE 06/22/2020 1952   PROTEINUR NEGATIVE 06/22/2020 1952   UROBILINOGEN 0.2 10/27/2007 1029   NITRITE NEGATIVE 06/22/2020 1952   LEUKOCYTESUR NEGATIVE 06/22/2020 1952    Radiological Exams on Admission: CT ABDOMEN PELVIS WO CONTRAST  Result Date: 06/22/2020 CLINICAL DATA:  87 year old with acute abdominal pain. Weakness. Fall in the bathroom this morning. EXAM: CT ABDOMEN AND PELVIS WITHOUT CONTRAST TECHNIQUE: Multidetector CT imaging of the abdomen and pelvis was performed following the standard protocol without IV contrast. COMPARISON:  CT 06/23/2014 FINDINGS: Mild motion artifact limitations. Lower chest: No acute airspace disease or pleural effusion. Pacemaker wires partially included. Small hiatal hernia with mild distal esophageal wall thickening. Hepatobiliary: No focal hepatic abnormality on noncontrast exam. Cholecystectomy without biliary dilatation. Pancreas: No ductal  dilatation or inflammation. Spleen: Normal in size without focal abnormality. Adrenals/Urinary Tract: No adrenal nodule or hemorrhage. No hydronephrosis or renal calculi. Bilateral renal parenchymal thinning and perinephric edema, chronic. Previous right renal collecting system duplication was better assessed on prior contrast-enhanced exam. No evidence of focal renal lesion. Urinary bladder is partially distended, streak artifact from right hip arthroplasty partially obscures detailed assessment. Stomach/Bowel: Small hiatal hernia with mild wall thickening of the distal esophagus. Stomach is partially distended, grossly unremarkable. There is a duodenal diverticulum. Scattered fecalization of small bowel contents without abnormal distension or obstruction. Appendix not definitively visualized, no appendicitis. Diffuse colonic diverticulosis. No evidence of diverticulitis. Moderate stool burden with colonic redundancy. Rectum is distended with stool, 8.4 cm. No definite associated wall thickening. There is no colonic wall thickening or pericolonic edema. Vascular/Lymphatic: Aortic atherosclerosis. No aortic aneurysm. 13 mm left periaortic node, series 2, image 33. 10 mm anterior periaortic node, series 2, image 38. Multiple additional small retroperitoneal nodes are not enlarged by size criteria. Few scattered mesenteric nodes. There is mild generalized edema of the retroperitoneal fat. This does not appear centered on the aorta. No definite pelvic adenopathy, evaluation of the right pelvis partially obscured by streak artifact from right hip arthroplasty. Reproductive: Enlarged prostate gland spanning 7.2 x 5.9 x 5.2 cm (volume = 120 cm^3). Other: No free air or free fluid. Laxity of the anterior abdominal wall musculature without frank hernia. There is fat in the left greater than right inguinal canal. Musculoskeletal: Right hip arthroplasty. No acute fracture of the included spine, ribs, or pelvis. No focal bone  lesion. No focal body wall soft tissue abnormality. IMPRESSION: 1. Mild generalized edema of the retroperitoneal fat with scattered mildly enlarged retroperitoneal nodes. This is of uncertain significance. Possibility of retroperitoneal fibrosis is raised. Inflammatory changes do not appear centered on the aorta are vascular structures, and vasculitis or acute vascular process is felt unlikely. 2. Moderate stool burden with colonic redundancy and fecalization of small bowel contents, consistent with slow transit. No bowel obstruction or inflammation. 3. Colonic diverticulosis without diverticulitis. 4. Enlarged prostate gland. 5. Small hiatal hernia with mild distal esophageal wall thickening, can be seen with reflux or esophagitis. Aortic Atherosclerosis (ICD10-I70.0). Electronically Signed   By: Keith Rake M.D.   On: 06/22/2020 20:18   US Abdomen Limited RUQ (LIVER/GB)  Result Date: 06/22/2020 CLINICAL DATA:  Elevated LFTs EXAM: ULTRASOUND ABDOMEN LIMITED RIGHT UPPER QUADRANT COMPARISON:  CT from earlier in the same day. FINDINGS: Gallbladder: Surgically removed Common bile duct: Diameter: 3.1 mm. Liver: No focal lesion identified. Within normal limits in parenchymal echogenicity. Portal vein is patent on color Doppler imaging with normal direction of blood flow towards the liver. Other: None. IMPRESSION: Status post cholecystectomy.  No other focal abnormality is noted. Electronically Signed   By: Inez Catalina M.D.   On: 06/22/2020 21:33    Assessment/Plan Principal Problem:   Elevated LFTs Active Problems:   HTN (hypertension)   Near syncope   BPH (benign prostatic hyperplasia)   Acute abdominal pain/elevated LFTs White blood cell count mildly elevated but no fever other tachycardia to suggest sepsis.  LFTs elevated (AST 1074, ALT 485, alk phos 211, T bili 2.3).  Lipase normal.  Acetaminophen level undetectable.  CT abdomen did not reveal cause of acute abdominal pain and elevated LFTs.   Right upper quadrant ultrasound showing no acute abnormality.  ED physician discussed the case with Dr. Cristina Gong from gastroenterology who recommended admission for antibiotics and possible MRCP in the morning. -Continue Levaquin and metronidazole.  IV fluid hydration, keep n.p.o. Check lactic acid level.  Acute hepatitis panel pending.  Continue to monitor WBC count.  MRCP ordered but will not be done until morning as patient has a pacemaker.  Addendum: No IV fluid at this time given significant peripheral edema.  Near syncope: Patient had a near syncopal episode at home which was reported by family.  Although this could be due to abdominal pain, he does have a history of extreme bradycardia due to second-degree AV block for which he went underwent pacemaker implantation in February 2022.  Not bradycardic or hypotensive in the ED. -Cardiac monitoring, check orthostatics.  Consider consulting cardiology for pacemaker interrogation if symptoms persist.  Hypertension: Not on any antihypertensives.  Blood pressure stable. -Check orthostatics given near syncopal episode at home  BPH -Resume home medication after pharmacy med rec is done.  DVT prophylaxis: SCDs Code Status: Full code by default.  Patient is not sure about his wishes.  Please speak to family and readdress in the morning. Family Communication: No family available at this time. Disposition Plan: Status is: Inpatient  Remains inpatient appropriate because:IV treatments appropriate due to intensity of illness or inability to take PO and Inpatient level of care appropriate due to severity of illness   Dispo: The patient is from: Home              Anticipated d/c is to: Home              Patient currently is not medically stable to d/c.   Difficult to place patient No  Consults called: Gastroenterology  Level of care: Level of care: Med-Surg   The medical decision making on this patient was of high complexity and the patient is at  high risk for clinical deterioration, therefore this is a level 3 visit.  Shela Leff MD [facility]  If 7PM-7AM, please contact night-coverage [email or web address]  06/23/2020, 4:03 AM

## 2020-06-23 NOTE — Progress Notes (Signed)
Device system confirmed to be MRI conditional  implant date > 6 weeks ago  no evidence of abandoned or epicardial leads in review of most recent CXR  interrogation from today reviewed, currently is AS/VP 80-83bpm   Changed device settings for MRI to  DOO at 95 bpm  Will Program device back to pre-MRI settings after completion of exam, and send transmission.

## 2020-06-23 NOTE — Progress Notes (Signed)
Date and time results received: 06/23/20  @ 0720 (use smartphrase ".now" to insert current time)  Test: Latic Acid Critical Value: 2.1  Name of Provider Notified:Pahwani  Orders Received? yes

## 2020-06-23 NOTE — Progress Notes (Signed)
Pharmacy Antibiotic Note  Terry Gregory is a 87 y.o. male admitted on 06/22/2020 with intra-abdominal infection.  Pharmacy has been consulted for Levaquin dosing. On metronidazole per MD.  Afebrile SCr 0.99(5/11) >> 1.2 (5/12) - worsening WBC 12.1 (5/11) >> 19.6 (5/12)  Plan: Adjust levofloxacin to 250mg  q24h per renal function  Continues on flagyl per MD Continue to monitor for clinical status and for renal function changes  Height: 6\' 3"  (190.5 cm) Weight: 90.7 kg (200 lb) IBW/kg (Calculated) : 84.5  Temp (24hrs), Avg:98.5 F (36.9 C), Min:97.5 F (36.4 C), Max:99.9 F (37.7 C)  Recent Labs  Lab 06/22/20 1849 06/23/20 0611  WBC 12.1* 19.6*  CREATININE 0.99 1.20  LATICACIDVEN  --  2.1*    Estimated Creatinine Clearance: 42.1 mL/min (by C-G formula based on SCr of 1.2 mg/dL).    Allergies  Allergen Reactions  . Erythromycin Hives and Rash  . Penicillins Hives and Rash    .Marland KitchenHas patient had a PCN reaction causing immediate rash, facial/tongue/throat swelling, SOB or lightheadedness with hypotension: Yes Has patient had a PCN reaction causing severe rash involving mucus membranes or skin necrosis: No Has patient had a PCN reaction that required hospitalization No Has patient had a PCN reaction occurring within the last 10 years: No If all of the above answers are "NO", then may proceed with Cephalosporin use.    Antimicrobials this admission: Levaquin 5/11>> Metronidazole 5/12>>  Microbiology results: No cultures ordered at this time  Thank you for allowing pharmacy to be a part of this patient's care.  Burnett Corrente 06/23/2020 1:36 PM

## 2020-06-23 NOTE — Consult Note (Addendum)
[facility] Gastroenterology Consult  Referring Provider: Emergency room provider Primary Care Physician:  Burnard Bunting, MD Primary Gastroenterologist: Dr. Cristina Gong  Reason for Consultation: Abnormal LFTs  HPI: Terry Gregory is a 87 y.o. male presented to the ER after a fall. Most of the history is obtained from the patient's daughter, [name](available otherwise at [phone]) who lives with the patient and states that yesterday morning patient complained of epigastric discomfort, described as a gas pocket for which she gave him 2 tablets of simethicone.  After lunch patient stated that the epigastric discomfort returned, he went to the restroom and she heard him fall without loss of consciousness. Patient states that he had urine on his depends and clothes and while trying to clean himself he gradually fell on the floor and against the wall. As per patient's daughter, his bowel movement appeared soft, formed, without any blood in stool or black stools. However patient could not get up and ambulate and was subsequently brought to the ER. Since he came to the ER patient has not had any abdominal pain and is currently n.p.o. waiting for an MRCP.  At home patient has difficulty swallowing food like beef, and coughs easily on swallowing. He also has constipation for which he takes senna, complains of increased flatulence for which he takes align probiotics.  Recently patient has not had any nausea or vomiting, yesterday morning he complained of chills and his temperature was 99.1 F. Otherwise patient has a normal appetite, denies unintentional weight loss, denies acid reflux or heartburn.  Patient has a significant cardiac history including idiopathic pericardial effusion, severe bradycardia, second-degree AV block and had a permanent pacemaker implantation in February 2022, he is not on any anticoagulation. He had his gallbladder removed in 2000.   Past Medical History:  Diagnosis Date  . BPH  (benign prostatic hyperplasia)   . Diverticulosis   . Pericardial effusion     Past Surgical History:  Procedure Laterality Date  . APPENDECTOMY  1960  . CATARACT EXTRACTION  2009   both eyes  . CHOLECYSTECTOMY  2000  . INGUINAL HERNIA REPAIR  1980's  . PACEMAKER IMPLANT N/A 03/21/2020   Procedure: PACEMAKER IMPLANT;  Surgeon: Sanda Klein, MD;  Location: [facility];  Service: Cardiovascular;  Laterality: N/A;  . TOTAL HIP ARTHROPLASTY  2003   right  . WRIST SURGERY  2009   left    Prior to Admission medications   Medication Sig Start Date End Date Taking? Authorizing Provider  aspirin EC 81 MG tablet Take 81 mg by mouth daily.   Yes [provider]  finasteride (PROSCAR) 5 MG tablet Take 5 mg by mouth daily. 09/27/17  Yes [provider]    Current Facility-Administered Medications  Medication Dose Route Frequency Provider Last Rate Last Admin  . 0.9 %  sodium chloride infusion   Intravenous Continuous Darliss Cheney, MD 100 mL/hr at 06/23/20 0922 100 mL/hr at 06/23/20 0922  . levofloxacin (LEVAQUIN) IVPB 750 mg  750 mg Intravenous Q24H Shela Leff, MD      . metroNIDAZOLE (FLAGYL) IVPB 500 mg  500 mg Intravenous Quay Burow, MD 100 mL/hr at 06/23/20 0508 500 mg at 06/23/20 0508    Allergies as of 06/22/2020 - Review Complete 04/05/2020  Allergen Reaction Noted  . Erythromycin Hives and Rash 09/21/2012  . Penicillins Hives and Rash 09/21/2012    Family History  Problem Relation Age of Onset  . Heart attack Father   . Stroke Mother   .  Heart failure Brother   . Stroke Brother   . Diabetes Sister   . Cancer Sister     Social History   Socioeconomic History  . Marital status: Widowed    Spouse name: Not on file  . Number of children: Not on file  . Years of education: Not on file  . Highest education level: Not on file  Occupational History  . Not on file  Tobacco Use  . Smoking status: Never Smoker  . Smokeless  tobacco: Never Used  Substance and Sexual Activity  . Alcohol use: No  . Drug use: No  . Sexual activity: Not on file  Other Topics Concern  . Not on file  Social History Narrative  . Not on file   Social Determinants of Health   Financial Resource Strain: Not on file  Food Insecurity: Not on file  Transportation Needs: Not on file  Physical Activity: Not on file  Stress: Not on file  Social Connections: Not on file  Intimate Partner Violence: Not on file    Review of Systems: Positive for: GI: Described in detail in HPI.    Gen: Denies any fever, chills, rigors, night sweats, anorexia, fatigue, weakness, malaise, involuntary weight loss, and sleep disorder CV: Denies chest pain, angina, palpitations, syncope, orthopnea, PND, peripheral edema, and claudication. Resp: Denies dyspnea, cough, sputum, wheezing, coughing up blood. GU : Denies urinary burning, blood in urine, urinary frequency, urinary hesitancy, nocturnal urination, and urinary incontinence. MS: Weakness, uses a walker for ambulation Derm: Denies rash, itching, oral ulcerations, hives, unhealing ulcers.  Psych: Denies depression, anxiety, memory loss, suicidal ideation, hallucinations,  and confusion. Heme: Denies bruising, bleeding, and enlarged lymph nodes. Neuro:  Denies any headaches, dizziness, paresthesias. Endo:  Denies any problems with DM, thyroid, adrenal function.  Physical Exam: Vital signs in last 24 hours: Temp:  [97.5 F (36.4 C)-99.9 F (37.7 C)] 97.5 F (36.4 C) (05/12 0758) Pulse Rate:  [66-95] 78 (05/12 0758) Resp:  [14-26] 17 (05/12 0758) BP: (101-137)/(55-69) 107/55 (05/12 0758) SpO2:  [94 %-99 %] 95 % (05/12 0758) Weight:  [90.7 kg] 90.7 kg (05/11 1844) Last BM Date: 06/22/20  General:   Elderly,hard of hearing Head:  Normocephalic and atraumatic. Eyes: Mild icterus  Ears:  Normal auditory acuity. Nose:  No deformity, discharge,  or lesions. Mouth:  No deformity or lesions.   Oropharynx pink & moist. Neck:  Supple; no masses or thyromegaly. Lungs:  Clear throughout to auscultation.   No wheezes, crackles, or rhonchi. No acute distress. Heart:  Regular rate and rhythm; no murmurs, clicks, rubs,  or gallops. Extremities:  Without clubbing or edema. Neurologic:  Alert and  oriented x4;  grossly normal neurologically. Skin:  Intact without significant lesions or rashes. Psych:  Alert and cooperative. Normal mood and affect. Abdomen:  Soft, nontender and nondistended. No masses, hepatosplenomegaly or hernias noted. Normal bowel sounds, without guarding, and without rebound.         Lab Results: Recent Labs    06/22/20 1849 06/23/20 0611  WBC 12.1* 19.6*  HGB 12.4* 10.8*  HCT 36.3* 32.5*  PLT 155 130*   BMET Recent Labs    06/22/20 1849 06/23/20 0611  NA 134* 136  K 3.8 4.2  CL 101 105  CO2 23 22  GLUCOSE 147* 115*  BUN 29* 29*  CREATININE 0.99 1.20  CALCIUM 8.6* 8.4*   LFT Recent Labs    06/23/20 0611  PROT 5.6*  ALBUMIN 3.0*  AST 713*  ALT 514*  ALKPHOS 150*  BILITOT 3.1*   PT/INR Recent Labs    06/22/20 1952  LABPROT 15.6*  INR 1.2    Studies/Results: CT ABDOMEN PELVIS WO CONTRAST  Result Date: 06/22/2020 CLINICAL DATA:  87 year old with acute abdominal pain. Weakness. Fall in the bathroom this morning. EXAM: CT ABDOMEN AND PELVIS WITHOUT CONTRAST TECHNIQUE: Multidetector CT imaging of the abdomen and pelvis was performed following the standard protocol without IV contrast. COMPARISON:  CT 06/23/2014 FINDINGS: Mild motion artifact limitations. Lower chest: No acute airspace disease or pleural effusion. Pacemaker wires partially included. Small hiatal hernia with mild distal esophageal wall thickening. Hepatobiliary: No focal hepatic abnormality on noncontrast exam. Cholecystectomy without biliary dilatation. Pancreas: No ductal dilatation or inflammation. Spleen: Normal in size without focal abnormality. Adrenals/Urinary Tract: No  adrenal nodule or hemorrhage. No hydronephrosis or renal calculi. Bilateral renal parenchymal thinning and perinephric edema, chronic. Previous right renal collecting system duplication was better assessed on prior contrast-enhanced exam. No evidence of focal renal lesion. Urinary bladder is partially distended, streak artifact from right hip arthroplasty partially obscures detailed assessment. Stomach/Bowel: Small hiatal hernia with mild wall thickening of the distal esophagus. Stomach is partially distended, grossly unremarkable. There is a duodenal diverticulum. Scattered fecalization of small bowel contents without abnormal distension or obstruction. Appendix not definitively visualized, no appendicitis. Diffuse colonic diverticulosis. No evidence of diverticulitis. Moderate stool burden with colonic redundancy. Rectum is distended with stool, 8.4 cm. No definite associated wall thickening. There is no colonic wall thickening or pericolonic edema. Vascular/Lymphatic: Aortic atherosclerosis. No aortic aneurysm. 13 mm left periaortic node, series 2, image 33. 10 mm anterior periaortic node, series 2, image 38. Multiple additional small retroperitoneal nodes are not enlarged by size criteria. Few scattered mesenteric nodes. There is mild generalized edema of the retroperitoneal fat. This does not appear centered on the aorta. No definite pelvic adenopathy, evaluation of the right pelvis partially obscured by streak artifact from right hip arthroplasty. Reproductive: Enlarged prostate gland spanning 7.2 x 5.9 x 5.2 cm (volume = 120 cm^3). Other: No free air or free fluid. Laxity of the anterior abdominal wall musculature without frank hernia. There is fat in the left greater than right inguinal canal. Musculoskeletal: Right hip arthroplasty. No acute fracture of the included spine, ribs, or pelvis. No focal bone lesion. No focal body wall soft tissue abnormality. IMPRESSION: 1. Mild generalized edema of the  retroperitoneal fat with scattered mildly enlarged retroperitoneal nodes. This is of uncertain significance. Possibility of retroperitoneal fibrosis is raised. Inflammatory changes do not appear centered on the aorta are vascular structures, and vasculitis or acute vascular process is felt unlikely. 2. Moderate stool burden with colonic redundancy and fecalization of small bowel contents, consistent with slow transit. No bowel obstruction or inflammation. 3. Colonic diverticulosis without diverticulitis. 4. Enlarged prostate gland. 5. Small hiatal hernia with mild distal esophageal wall thickening, can be seen with reflux or esophagitis. Aortic Atherosclerosis (ICD10-I70.0). Electronically Signed   By: Keith Rake M.D.   On: 06/22/2020 20:18   US Abdomen Limited RUQ (LIVER/GB)  Result Date: 06/22/2020 CLINICAL DATA:  Elevated LFTs EXAM: ULTRASOUND ABDOMEN LIMITED RIGHT UPPER QUADRANT COMPARISON:  CT from earlier in the same day. FINDINGS: Gallbladder: Surgically removed Common bile duct: Diameter: 3.1 mm. Liver: No focal lesion identified. Within normal limits in parenchymal echogenicity. Portal vein is patent on color Doppler imaging with normal direction of blood flow towards the liver. Other: None. IMPRESSION: Status post cholecystectomy.  No other focal abnormality is noted.  Electronically Signed   By: Inez Catalina M.D.   On: 06/22/2020 21:33    Impression: Epigastric discomfort, WBC increased from 12.1-19.6 Abnormal LFTs, T bili/AST/ALT/ALP of 2.3 / 1074 / 485 / 211 which has trended down to 3.1 / 713 / 514/ 150 Microcytic anemia, hemoglobin 10.8, MCV 100.9, platelet 130 Elevated BUN of 29, creatinine 1.2, GFR 55 Hepatitis B core IgM negative, hepatitis A IgM negative, hepatitis C antibody negative  CT abdomen and pelvis without contrast showed normal liver, cholecystectomy without biliary dilatation Small hiatal hernia, mild thickening of esophagus, duodenal diverticulum, diffuse colonic  diverticulosis Moderate stool burden, rectum distended with stool 8.4 cm Mild generalized edema of retroperitoneal fat with mildly enlarged retroperitoneal nodes of uncertain significance, 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 Patient scheduled for MRCP to evaluate CBD  LFT pattern markedly elevated and trending down, could be related to ischemic hepatitis(given patient's cardiac history) Slightly hypotensive, blood pressure 107 / 55 mmHg, normal pulse rate  Plan: Okay to start clear liquid diet for now after MRCP is completed. Patient empirically started on IV Levaquin and IV Flagyl. If CBD stone is noted, discussed about ERCP with the patient and his daughter at bedside. He is 4, discussed about risks of anesthesia, complications associated with ERCP including but not limited to bleeding, perforation, pancreatitis and even death. After the results of MRCP, the patient and the daughter wanted to discuss about the next step.  If MRCP is negative, recommend maintaining hemodynamic stability, as LFT abnormality could be reflective of ischemic hepatitis.     LOS: 0 days   Ronnette Juniper, MD  06/23/2020, 11:37 AM

## 2020-06-24 ENCOUNTER — Encounter (HOSPITAL_COMMUNITY)
Admission: EM | Disposition: A | Discharge status: Home / Self Care | Payer: Self-pay | Source: Home / Self Care | Attending: Family Medicine

## 2020-06-24 ENCOUNTER — Inpatient Hospital Stay (HOSPITAL_COMMUNITY): Payer: Medicare Other | Admitting: Certified Registered Nurse Anesthetist

## 2020-06-24 ENCOUNTER — Inpatient Hospital Stay (HOSPITAL_COMMUNITY): Payer: Medicare Other

## 2020-06-24 ENCOUNTER — Encounter (HOSPITAL_COMMUNITY): Payer: Self-pay | Admitting: Family Medicine

## 2020-06-24 HISTORY — PX: SPHINCTEROTOMY: SHX5544

## 2020-06-24 HISTORY — PX: ERCP: SHX5425

## 2020-06-24 HISTORY — PX: REMOVAL OF STONES: SHX5545

## 2020-06-24 HISTORY — PX: STONE EXTRACTION WITH BASKET: SHX5318

## 2020-06-24 LAB — CBC WITH DIFFERENTIAL/PLATELET
Abs Immature Granulocytes: 0.08 10*3/uL — ABNORMAL HIGH (ref 0.00–0.07)
Basophils Absolute: 0 10*3/uL (ref 0.0–0.1)
Basophils Relative: 0 %
Eosinophils Absolute: 0.1 10*3/uL (ref 0.0–0.5)
Eosinophils Relative: 1 %
HCT: 33.4 % — ABNORMAL LOW (ref 39.0–52.0)
Hemoglobin: 10.7 g/dL — ABNORMAL LOW (ref 13.0–17.0)
Immature Granulocytes: 1 %
Lymphocytes Relative: 8 %
Lymphs Abs: 1 10*3/uL (ref 0.7–4.0)
MCH: 32.6 pg (ref 26.0–34.0)
MCHC: 32 g/dL (ref 30.0–36.0)
MCV: 101.8 fL — ABNORMAL HIGH (ref 80.0–100.0)
Monocytes Absolute: 0.8 10*3/uL (ref 0.1–1.0)
Monocytes Relative: 6 %
Neutro Abs: 11.2 10*3/uL — ABNORMAL HIGH (ref 1.7–7.7)
Neutrophils Relative %: 84 %
Platelets: 125 10*3/uL — ABNORMAL LOW (ref 150–400)
RBC: 3.28 MIL/uL — ABNORMAL LOW (ref 4.22–5.81)
RDW: 13.7 % (ref 11.5–15.5)
WBC: 13.2 10*3/uL — ABNORMAL HIGH (ref 4.0–10.5)
nRBC: 0 % (ref 0.0–0.2)

## 2020-06-24 LAB — COMPREHENSIVE METABOLIC PANEL
ALT: 342 U/L — ABNORMAL HIGH (ref 0–44)
AST: 327 U/L — ABNORMAL HIGH (ref 15–41)
Albumin: 2.8 g/dL — ABNORMAL LOW (ref 3.5–5.0)
Alkaline Phosphatase: 139 U/L — ABNORMAL HIGH (ref 38–126)
Anion gap: 9 (ref 5–15)
BUN: 26 mg/dL — ABNORMAL HIGH (ref 8–23)
CO2: 23 mmol/L (ref 22–32)
Calcium: 8.5 mg/dL — ABNORMAL LOW (ref 8.9–10.3)
Chloride: 104 mmol/L (ref 98–111)
Creatinine, Ser: 1.09 mg/dL (ref 0.61–1.24)
GFR, Estimated: >60 mL/min (ref >60)
Glucose, Bld: 86 mg/dL (ref 70–99)
Potassium: 3.6 mmol/L (ref 3.5–5.1)
Sodium: 136 mmol/L (ref 135–145)
Total Bilirubin: 2.3 mg/dL — ABNORMAL HIGH (ref 0.3–1.2)
Total Protein: 5.5 g/dL — ABNORMAL LOW (ref 6.5–8.1)

## 2020-06-24 SURGERY — ERCP, WITH INTERVENTION IF INDICATED
Anesthesia: General

## 2020-06-24 MED ORDER — INDOMETHACIN 50 MG RE SUPP
RECTAL | Status: AC
  Filled 2020-06-24: qty 2

## 2020-06-24 MED ORDER — FENTANYL CITRATE (PF) 100 MCG/2ML IJ SOLN
INTRAMUSCULAR | Status: AC
  Filled 2020-06-24: qty 2

## 2020-06-24 MED ORDER — ROCURONIUM BROMIDE 10 MG/ML (PF) SYRINGE
PREFILLED_SYRINGE | INTRAVENOUS | Status: DC | PRN
  Administered 2020-06-24: 20 mg via INTRAVENOUS
  Administered 2020-06-24: 30 mg via INTRAVENOUS

## 2020-06-24 MED ORDER — FENTANYL CITRATE (PF) 250 MCG/5ML IJ SOLN
INTRAMUSCULAR | Status: DC | PRN
  Administered 2020-06-24: 50 ug via INTRAVENOUS

## 2020-06-24 MED ORDER — GLUCAGON HCL RDNA (DIAGNOSTIC) 1 MG IJ SOLR
INTRAMUSCULAR | Status: AC
  Filled 2020-06-24: qty 1

## 2020-06-24 MED ORDER — PANTOPRAZOLE SODIUM 40 MG PO TBEC
40.0000 mg | DELAYED_RELEASE_TABLET | Freq: Every day | ORAL | Status: DC
  Administered 2020-06-24 – 2020-06-26 (×3): 40 mg via ORAL
  Filled 2020-06-24 (×3): qty 1

## 2020-06-24 MED ORDER — PHENYLEPHRINE HCL-NACL 10-0.9 MG/250ML-% IV SOLN
INTRAVENOUS | Status: DC | PRN
  Administered 2020-06-24: 25 ug/min via INTRAVENOUS

## 2020-06-24 MED ORDER — SODIUM CHLORIDE 0.9% FLUSH
3.0000 mL | Freq: Two times a day (BID) | INTRAVENOUS | Status: DC
  Administered 2020-06-24 – 2020-06-25 (×3): 3 mL via INTRAVENOUS

## 2020-06-24 MED ORDER — PROPOFOL 10 MG/ML IV BOLUS
INTRAVENOUS | Status: DC | PRN
  Administered 2020-06-24: 150 mg via INTRAVENOUS

## 2020-06-24 MED ORDER — SODIUM CHLORIDE 0.9 % IV SOLN
INTRAVENOUS | Status: DC | PRN
  Administered 2020-06-24: 50 mL

## 2020-06-24 MED ORDER — SUCCINYLCHOLINE CHLORIDE 200 MG/10ML IV SOSY
PREFILLED_SYRINGE | INTRAVENOUS | Status: DC | PRN
  Administered 2020-06-24: 140 mg via INTRAVENOUS

## 2020-06-24 MED ORDER — ONDANSETRON HCL 4 MG/2ML IJ SOLN
INTRAMUSCULAR | Status: DC | PRN
  Administered 2020-06-24: 4 mg via INTRAVENOUS

## 2020-06-24 MED ORDER — SUGAMMADEX SODIUM 200 MG/2ML IV SOLN
INTRAVENOUS | Status: DC | PRN
  Administered 2020-06-24: 200 mg via INTRAVENOUS

## 2020-06-24 MED ORDER — LIDOCAINE 2% (20 MG/ML) 5 ML SYRINGE
INTRAMUSCULAR | Status: DC | PRN
  Administered 2020-06-24: 100 mg via INTRAVENOUS

## 2020-06-24 MED ORDER — LACTATED RINGERS IV SOLN
INTRAVENOUS | Status: AC | PRN
  Administered 2020-06-24: 1000 mL via INTRAVENOUS

## 2020-06-24 MED ORDER — INDOMETHACIN 50 MG RE SUPP
RECTAL | Status: DC | PRN
  Administered 2020-06-24: 100 mg via RECTAL

## 2020-06-24 MED ORDER — EPHEDRINE SULFATE-NACL 50-0.9 MG/10ML-% IV SOSY
PREFILLED_SYRINGE | INTRAVENOUS | Status: DC | PRN
  Administered 2020-06-24: 5 mg via INTRAVENOUS
  Administered 2020-06-24: 10 mg via INTRAVENOUS

## 2020-06-24 NOTE — Anesthesia Preprocedure Evaluation (Addendum)
Anesthesia Evaluation  Patient identified by MRN, date of birth, ID band Patient awake    Reviewed: Allergy & Precautions, NPO status , Patient's Chart, lab work & pertinent test results  Airway Mallampati: I       Dental no notable dental hx.    Pulmonary neg pulmonary ROS,    Pulmonary exam normal        Cardiovascular Normal cardiovascular exam+ pacemaker      Neuro/Psych negative neurological ROS  negative psych ROS   GI/Hepatic negative GI ROS, Neg liver ROS,   Endo/Other    Renal/GU negative Renal ROS  negative genitourinary   Musculoskeletal negative musculoskeletal ROS (+)   Abdominal Normal abdominal exam  (+)   Peds negative pediatric ROS (+)  Hematology negative hematology ROS (+)   Anesthesia Other Findings EDT    Remote reviewed.  Not pacemaker dependent. Virtually 100% ventricular pacing due to second-degree AV block. Battery status is good. Lead measurements are stable. Heart rate histogram is favorable. No clinically significant episodes of high ventricular rate or atrial mode switch noted.    ventricular ejection fraction, by estimation, is 60 to 65%. Left ventricular ejection fraction by 3D volume is 61 %. The left ventricle has normal function. The left ventricle has no regional wall motion abnormalities. Left ventricular diastolic parameters are indeterminate. The average left ventricular global longitudinal strain is -17.4 %. The global longitudinal strain is normal. 2. Right ventricular systolic function is normal. The right ventricular size is normal. There is normal pulmonary artery systolic pressure. The estimated right ventricular systolic pressure is 74.9 mmHg. 3. Left atrial size was mildly dilated. 4. Right atrial size was moderately dilated. 5. The mitral valve is normal in structure. No evidence of mitral valve regurgitation. No evidence of mitral stenosis. 6. The  aortic valve is calcified. There is moderate calcification of the aortic valve. There is moderate thickening of the aortic valve. Aortic valve regurgitation is not visualized. Mild aortic valve sclerosis is present, with no evidence of aortic valve stenosis. 7. Aortic dilatation noted. There is mild dilatation of the ascending aorta, measuring 43 mm. 8. The inferior vena cava is normal in size with greater than 50% respiratory variability, suggesting right atrial pressure of 3 mmHg. Left Ventricle: Left   Reproductive/Obstetrics                            Anesthesia Physical Anesthesia Plan  ASA: II  Anesthesia Plan: General   Post-op Pain Management:    Induction: Intravenous  PONV Risk Score and Plan: 2 and Ondansetron  Airway Management Planned: Oral ETT  Additional Equipment: None  Intra-op Plan:   Post-operative Plan:   Informed Consent: I have reviewed the patients History and Physical, chart, labs and discussed the procedure including the risks, benefits and alternatives for the proposed anesthesia with the patient or authorized representative who has indicated his/her understanding and acceptance.     Dental advisory given  Plan Discussed with: CRNA  Anesthesia Plan Comments:         Anesthesia Quick Evaluation

## 2020-06-24 NOTE — Progress Notes (Signed)
PROGRESS NOTE    Terry Gregory  WCB:[id number] DOB: 08-May-1921 DOA: 06/22/2020 PCP: Burnard Bunting, MD   Brief Narrative:  Terry Gregory is a 87 y.o. 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  In the ED, afebrile. WBC 12.1,  AST 1074, ALT 485, alk phos 211, T bili 2.3.  Lipase normal.  INR 1.2.  Acute hepatitis panel negative.  Acetaminophen level undetectable.  Screening COVID and influenza PCR negative.  CT abdomen did not reveal cause of acute abdominal pain and elevated LFTs.  Right upper quadrant ultrasound showing no acute abnormality. ED physician discussed the case with Dr. Cristina Gong from gastroenterology who recommended admission for antibiotics.  Underwent MRCP on 06/23/2020 which showed filling defect in distal CBD representing multiple stacked stones.  Underwent ERCP 06/24/2020 with removal of large CBD stone.   Assessment & Plan:   Principal Problem:   Elevated LFTs Active Problems:   HTN (hypertension)   Near syncope   BPH (benign prostatic hyperplasia)  Acute abdominal pain/elevated LFTs: (AST 1074, ALT 485, alk phos 211, T bili 2.3).  Lipase normal.  Acetaminophen level undetectable.  CT abdomen did not reveal cause of acute abdominal pain and elevated LFTs.  Right upper quadrant ultrasound showing no acute abnormality.  Seen by GI.  S/p MRCP 06/23/2020 which showed filling defect in distal CBD representing multiple stacked stones.  Underwent ERCP today with removal of large CBD stone.  GI recommends continuing antibiotics until  discharge.  Near syncope: Likely vasovagal.  No further episodes since admission.  Heart rate within normal range.  Hypertension: Slightly elevated likely due to pain.  Monitor.  BPH: Continue finasteride.  DVT prophylaxis: SCDs Start: 06/23/20 0359   Code Status: Full Code  Family Communication: Daughter present at bedside.  Plan of care discussed with patient in length with patient and daughter and both verbalized understanding and agreed with it.    Status is: Inpatient  Remains inpatient appropriate because:Ongoing diagnostic testing needed not appropriate for outpatient work up   Dispo: The patient is from: Home              Anticipated d/c is to: Home              Patient currently is not medically stable to d/c.   Difficult to place patient No        Estimated body mass index is 24.99 kg/m as calculated from the following:   Height as of this encounter: '6\' 3"'  (1.905 m).   Weight as of this encounter: 90.7 kg.      Nutritional status:               Consultants:   GI  Procedures:   None  Antimicrobials:  Anti-infectives (From admission, onward)   Start     Dose/Rate Route Frequency Ordered Stop   06/23/20 2200  levofloxacin (LEVAQUIN) IVPB 750 mg  Status:  Discontinued        750 mg 100 mL/hr over 90 Minutes Intravenous Every 24 hours 06/22/20 2240 06/23/20 1351   06/23/20 2200  [MAR Hold]  Levofloxacin (LEVAQUIN) IVPB 250 mg        (  MAR Hold since Fri 06/24/2020 at 1005.Hold Reason: Transfer to a Procedural area.)   250 mg 50 mL/hr over 60 Minutes Intravenous Every 24 hours 06/23/20 1351     06/23/20 0600  [MAR Hold]  metroNIDAZOLE (FLAGYL) IVPB 500 mg        (MAR Hold since Fri 06/24/2020 at 1005.Hold Reason: Transfer to a Procedural area.)   500 mg 100 mL/hr over 60 Minutes Intravenous Every 8 hours 06/23/20 0401     06/22/20 2230  levofloxacin (LEVAQUIN) IVPB 750 mg        750 mg 100 mL/hr over 90 Minutes Intravenous  Once 06/22/20 2222  06/23/20 0020   06/22/20 2230  metroNIDAZOLE (FLAGYL) IVPB 500 mg        500 mg 100 mL/hr over 60 Minutes Intravenous  Once 06/22/20 2222 06/23/20 0122         Subjective: Seen and examined this morning.  Daughter at the bedside.  Patient had no complaint.  No abdominal pain.  Objective: Vitals:   06/23/20 1944 06/24/20 0350 06/24/20 0808 06/24/20 1008  BP: 131/64 139/69 (!) 143/68 (!) 171/69  Pulse: 74 78 63 70  Resp: '17 17 17 20  ' Temp: 98.1 F (36.7 C) 97.8 F (36.6 C) 98 F (36.7 C) 98.1 F (36.7 C)  TempSrc:  Oral Oral Oral  SpO2: 97% 94% 92% 97%  Weight:    90.7 kg  Height:    '6\' 3"'  (1.905 m)    Intake/Output Summary (Last 24 hours) at 06/24/2020 1223 Last data filed at 06/24/2020 1219 Gross per 24 hour  Intake 1130.55 ml  Output 325 ml  Net 805.55 ml   Filed Weights   06/22/20 1844 06/24/20 1008  Weight: 90.7 kg 90.7 kg    Examination:  General exam: Appears calm and comfortable  Respiratory system: Clear to auscultation. Respiratory effort normal. Cardiovascular system: S1 & S2 heard, RRR. No JVD, murmurs, rubs, gallops or clicks. No pedal edema. Gastrointestinal system: Abdomen is nondistended, soft and nontender. No organomegaly or masses felt. Normal bowel sounds heard. Central nervous system: Alert and oriented. No focal neurological deficits. Extremities: Symmetric 5 x 5 power. Skin: No rashes, lesions or ulcers.  Psychiatry: Judgement and insight appear normal. Mood & affect appropriate.   Data Reviewed: I have personally reviewed following labs and imaging studies  CBC: Recent Labs  Lab 06/22/20 1849 06/23/20 0611 06/24/20 0832  WBC 12.1* 19.6* 13.2*  NEUTROABS 11.7*  --  11.2*  HGB 12.4* 10.8* 10.7*  HCT 36.3* 32.5* 33.4*  MCV 100.3* 100.9* 101.8*  PLT 155 130* 532*   Basic Metabolic Panel: Recent Labs  Lab 06/22/20 1849 06/23/20 0611 06/24/20 0335  NA 134* 136 136  K 3.8 4.2 3.6  CL 101 105 104  CO2 '23 22 23  ' GLUCOSE 147* 115*  86  BUN 29* 29* 26*  CREATININE 0.99 1.20 1.09  CALCIUM 8.6* 8.4* 8.5*  MG  --  1.9  --    GFR: Estimated Creatinine Clearance: 46.3 mL/min (by C-G formula based on SCr of 1.09 mg/dL). Liver Function Tests: Recent Labs  Lab 06/22/20 1849 06/23/20 0611 06/24/20 0335  AST 1,074* 713* 327*  ALT 485* 514* 342*  ALKPHOS 211* 150* 139*  BILITOT 2.3* 3.1* 2.3*  PROT 6.9 5.6* 5.5*  ALBUMIN 3.6 3.0* 2.8*   Recent Labs  Lab 06/22/20 1849  LIPASE 24   No results for input(s): AMMONIA in the last 168 hours. Coagulation Profile: Recent Labs  Lab 06/22/20 1952  INR 1.2   Cardiac Enzymes: No results for input(s): CKTOTAL, CKMB, CKMBINDEX, TROPONINI in the last 168 hours. BNP (last 3 results) No results for input(s): PROBNP in the last 8760 hours. HbA1C: No results for input(s): HGBA1C in the last 72 hours. CBG: No results for input(s): GLUCAP in the last 168 hours. Lipid Profile: No results for input(s): CHOL, HDL, LDLCALC, TRIG, CHOLHDL, LDLDIRECT in the last 72 hours. Thyroid Function Tests: No results for input(s): TSH, T4TOTAL, FREET4, T3FREE, THYROIDAB in the last 72 hours. Anemia Panel: No results for input(s): VITAMINB12, FOLATE, FERRITIN, TIBC, IRON, RETICCTPCT in the last 72 hours. Sepsis Labs: Recent Labs  Lab 06/23/20 2025 06/23/20 1710  LATICACIDVEN 2.1* 0.9    Recent Results (from the past 240 hour(s))  Resp Panel by RT-PCR (Flu A&B, Covid) Nasopharyngeal Swab     Status: None   Collection Time: 06/22/20 10:40 PM   Specimen: Nasopharyngeal Swab; Nasopharyngeal(NP) swabs in vial transport medium  Result Value Ref Range Status   SARS Coronavirus 2 by RT PCR NEGATIVE NEGATIVE Final    Comment: (NOTE) SARS-CoV-2 target nucleic acids are NOT DETECTED.  The SARS-CoV-2 RNA is generally detectable in upper respiratory specimens during the acute phase of infection. The lowest concentration of SARS-CoV-2 viral copies this assay can detect is 138 copies/mL. A  negative result does not preclude SARS-Cov-2 infection and should not be used as the sole basis for treatment or other patient management decisions. A negative result may occur with  improper specimen collection/handling, submission of specimen other than nasopharyngeal swab, presence of viral mutation(s) within the areas targeted by this assay, and inadequate number of viral copies(<138 copies/mL). A negative result must be combined with clinical observations, patient history, and epidemiological information. The expected result is Negative.  Fact Sheet for Patients:  EntrepreneurPulse.com.au  Fact Sheet for Healthcare Providers:  IncredibleEmployment.be  This test is no t yet approved or cleared by the [place] FDA and  has been authorized for detection and/or diagnosis of SARS-CoV-2 by FDA under an Emergency Use Authorization (EUA). This EUA will remain  in effect (meaning this test can be used) for the duration of the COVID-19 declaration under Section 564(b)(1) of the Act, 21 U.S.C.section 360bbb-3(b)(1), unless the authorization is terminated  or revoked sooner.       Influenza A by PCR NEGATIVE NEGATIVE Final   Influenza B by PCR NEGATIVE NEGATIVE Final    Comment: (NOTE) The Xpert Xpress SARS-CoV-2/FLU/RSV plus assay is intended as an aid in the diagnosis of influenza from Nasopharyngeal swab specimens and should not be used as a sole basis for treatment. Nasal washings and aspirates are unacceptable for Xpert Xpress SARS-CoV-2/FLU/RSV testing.  Fact Sheet for Patients: EntrepreneurPulse.com.au  Fact Sheet for Healthcare Providers: IncredibleEmployment.be  This test is not yet approved or cleared by the [place] FDA and has been authorized for detection and/or diagnosis of SARS-CoV-2 by FDA under an Emergency Use Authorization (EUA). This EUA will remain in effect (meaning this test can  be used) for the duration of the COVID-19 declaration under Section 564(b)(1) of the Act, 21 U.S.C. section 360bbb-3(b)(1), unless the authorization is terminated or revoked.  Performed at [facility], [address] [place], [place] [address]       Radiology Studies: CT ABDOMEN PELVIS WO CONTRAST  Result Date: 06/22/2020 CLINICAL DATA:  87 year old with acute abdominal pain. Weakness. Fall in the bathroom this morning. EXAM: CT ABDOMEN AND PELVIS WITHOUT CONTRAST TECHNIQUE: Multidetector CT imaging of  the abdomen and pelvis was performed following the standard protocol without IV contrast. COMPARISON:  CT 06/23/2014 FINDINGS: Mild motion artifact limitations. Lower chest: No acute airspace disease or pleural effusion. Pacemaker wires partially included. Small hiatal hernia with mild distal esophageal wall thickening. Hepatobiliary: No focal hepatic abnormality on noncontrast exam. Cholecystectomy without biliary dilatation. Pancreas: No ductal dilatation or inflammation. Spleen: Normal in size without focal abnormality. Adrenals/Urinary Tract: No adrenal nodule or hemorrhage. No hydronephrosis or renal calculi. Bilateral renal parenchymal thinning and perinephric edema, chronic. Previous right renal collecting system duplication was better assessed on prior contrast-enhanced exam. No evidence of focal renal lesion. Urinary bladder is partially distended, streak artifact from right hip arthroplasty partially obscures detailed assessment. Stomach/Bowel: Small hiatal hernia with mild wall thickening of the distal esophagus. Stomach is partially distended, grossly unremarkable. There is a duodenal diverticulum. Scattered fecalization of small bowel contents without abnormal distension or obstruction. Appendix not definitively visualized, no appendicitis. Diffuse colonic diverticulosis. No evidence of diverticulitis. Moderate stool burden with colonic redundancy. Rectum is distended with  stool, 8.4 cm. No definite associated wall thickening. There is no colonic wall thickening or pericolonic edema. Vascular/Lymphatic: Aortic atherosclerosis. No aortic aneurysm. 13 mm left periaortic node, series 2, image 33. 10 mm anterior periaortic node, series 2, image 38. Multiple additional small retroperitoneal nodes are not enlarged by size criteria. Few scattered mesenteric nodes. There is mild generalized edema of the retroperitoneal fat. This does not appear centered on the aorta. No definite pelvic adenopathy, evaluation of the right pelvis partially obscured by streak artifact from right hip arthroplasty. Reproductive: Enlarged prostate gland spanning 7.2 x 5.9 x 5.2 cm (volume = 120 cm^3). Other: No free air or free fluid. Laxity of the anterior abdominal wall musculature without frank hernia. There is fat in the left greater than right inguinal canal. Musculoskeletal: Right hip arthroplasty. No acute fracture of the included spine, ribs, or pelvis. No focal bone lesion. No focal body wall soft tissue abnormality. IMPRESSION: 1. Mild generalized edema of the retroperitoneal fat with scattered mildly enlarged retroperitoneal nodes. This is of uncertain significance. Possibility of retroperitoneal fibrosis is raised. Inflammatory changes do not appear centered on the aorta are vascular structures, and vasculitis or acute vascular process is felt unlikely. 2. Moderate stool burden with colonic redundancy and fecalization of small bowel contents, consistent with slow transit. No bowel obstruction or inflammation. 3. Colonic diverticulosis without diverticulitis. 4. Enlarged prostate gland. 5. Small hiatal hernia with mild distal esophageal wall thickening, can be seen with reflux or esophagitis. Aortic Atherosclerosis (ICD10-I70.0). Electronically Signed   By: Keith Rake M.D.   On: 06/22/2020 20:18   MR 3D Recon At Scanner  Result Date: 06/24/2020 CLINICAL DATA:  Right upper quadrant abdominal  pain. Status post cholecystectomy. EXAM: MRI ABDOMEN WITHOUT AND WITH CONTRAST (INCLUDING MRCP) TECHNIQUE: Multiplanar multisequence MR imaging of the abdomen was performed both before and after the administration of intravenous contrast. Heavily T2-weighted images of the biliary and pancreatic ducts were obtained, and three-dimensional MRCP images were rendered by post processing. CONTRAST:  27m GADAVIST GADOBUTROL 1 MMOL/ML IV SOLN COMPARISON:  CT AP 11 scratch set CT AP 06/22/2020 and right upper quadrant ultrasound 06/22/2020 FINDINGS: Lower chest: New small bilateral pleural effusions. Hepatobiliary: Exam detail diminished due to motion artifact. No focal liver abnormality identified. Status post cholecystectomy. Mild intrahepatic bile duct dilatation. The common bile duct is increased in caliber measuring 8 mm, image 10/16. Filling defect within the distal CBD is identified measuring 1  cm in length and 4 mm in diameter. This may represent 1 elongated stone or multiple stacked stones. Pancreas: No main duct dilatation. No focal fluid collection or mass identified. The equivocal edema. Spleen:  Within normal limits in size and appearance. Adrenals/Urinary Tract: Normal adrenal glands. No kidney mass or hydronephrosis. Stomach/Bowel: Visualized portions within the abdomen are unremarkable. Vascular/Lymphatic: No pathologically enlarged lymph nodes identified. No abdominal aortic aneurysm demonstrated. Other: No free fluid or fluid collections. Trace retroperitoneal fluid/edema, nonspecific. Musculoskeletal: No suspicious bone lesions identified. IMPRESSION: 1. Exam detail diminished due to motion artifact. 2. Filling defect within the distal CBD may represent 1 cm elongated stone or multiple stacked stones. 3. Mild intrahepatic and common bile duct dilatation. 4. Equivocal edema within the pancreas. Correlate for any clinical signs of pancreatitis. 5. New small bilateral pleural effusions. Mild edema within the  mesenteric fat and retroperitoneal fat. Correlate for any clinical signs or symptoms of CHF/fluid overload. 6. These results will be called to the ordering clinician or representative by the Radiologist Assistant, and communication documented in the PACS or [employer]. Electronically Signed   By: Kerby Moors M.D.   On: 06/24/2020 07:51   MR ABDOMEN MRCP W WO CONTAST  Result Date: 06/24/2020 CLINICAL DATA:  Right upper quadrant abdominal pain. Status post cholecystectomy. EXAM: MRI ABDOMEN WITHOUT AND WITH CONTRAST (INCLUDING MRCP) TECHNIQUE: Multiplanar multisequence MR imaging of the abdomen was performed both before and after the administration of intravenous contrast. Heavily T2-weighted images of the biliary and pancreatic ducts were obtained, and three-dimensional MRCP images were rendered by post processing. CONTRAST:  16m GADAVIST GADOBUTROL 1 MMOL/ML IV SOLN COMPARISON:  CT AP 11 scratch set CT AP 06/22/2020 and right upper quadrant ultrasound 06/22/2020 FINDINGS: Lower chest: New small bilateral pleural effusions. Hepatobiliary: Exam detail diminished due to motion artifact. No focal liver abnormality identified. Status post cholecystectomy. Mild intrahepatic bile duct dilatation. The common bile duct is increased in caliber measuring 8 mm, image 10/16. Filling defect within the distal CBD is identified measuring 1 cm in length and 4 mm in diameter. This may represent 1 elongated stone or multiple stacked stones. Pancreas: No main duct dilatation. No focal fluid collection or mass identified. The equivocal edema. Spleen:  Within normal limits in size and appearance. Adrenals/Urinary Tract: Normal adrenal glands. No kidney mass or hydronephrosis. Stomach/Bowel: Visualized portions within the abdomen are unremarkable. Vascular/Lymphatic: No pathologically enlarged lymph nodes identified. No abdominal aortic aneurysm demonstrated. Other: No free fluid or fluid collections. Trace retroperitoneal  fluid/edema, nonspecific. Musculoskeletal: No suspicious bone lesions identified. IMPRESSION: 1. Exam detail diminished due to motion artifact. 2. Filling defect within the distal CBD may represent 1 cm elongated stone or multiple stacked stones. 3. Mild intrahepatic and common bile duct dilatation. 4. Equivocal edema within the pancreas. Correlate for any clinical signs of pancreatitis. 5. New small bilateral pleural effusions. Mild edema within the mesenteric fat and retroperitoneal fat. Correlate for any clinical signs or symptoms of CHF/fluid overload. 6. These results will be called to the ordering clinician or representative by the Radiologist Assistant, and communication documented in the PACS or [employer] Electronically Signed   By: TKerby MoorsM.D.   On: 06/24/2020 07:51   [place]Abdomen Limited RUQ (LIVER/GB)  Result Date: 06/22/2020 CLINICAL DATA:  Elevated LFTs EXAM: ULTRASOUND ABDOMEN LIMITED RIGHT UPPER QUADRANT COMPARISON:  CT from earlier in the same day. FINDINGS: Gallbladder: Surgically removed Common bile duct: Diameter: 3.1 mm. Liver: No focal lesion identified. Within normal limits  in parenchymal echogenicity. Portal vein is patent on color Doppler imaging with normal direction of blood flow towards the liver. Other: None. IMPRESSION: Status post cholecystectomy.  No other focal abnormality is noted. Electronically Signed   By: Inez Catalina M.D.   On: 06/22/2020 21:33    Scheduled Meds: . [MAR Hold] finasteride  5 mg Oral Daily   Continuous Infusions: . [MAR Hold] levofloxacin (LEVAQUIN) IV 250 mg (06/23/20 2309)  . [MAR Hold] metronidazole 500 mg (06/24/20 0517)     LOS: 1 day   Time spent: 30 minutes   Darliss Cheney, MD [facility]  06/24/2020, 12:23 PM   How to contact the [facility] Attending or Consulting provider [place] or covering provider during after hours [place], for this patient?  1. Check the care team in [place] and look for a) attending/consulting [facility]  provider listed and b) the [facility] team listed. Page or secure chat [phone]. 2. Log into [email or web address] and use [place]'s universal password to access. If you do not have the password, please contact the hospital operator. 3. Locate the [facility] provider you are looking for under [facility] and page to a number that you can be directly reached. 4. If you still have difficulty reaching the provider, please page the [facility] (Director on Call) for the Hospitalists listed on amion for assistance.

## 2020-06-24 NOTE — Transfer of Care (Signed)
Immediate Anesthesia Transfer of Care Note  Patient: Terry Gregory  Procedure(s) Performed: ENDOSCOPIC RETROGRADE CHOLANGIOPANCREATOGRAPHY (ERCP) (N/A ) SPHINCTEROTOMY REMOVAL OF STONES  Patient Location: Endoscopy Unit  Anesthesia Type:General  Level of Consciousness: drowsy  Airway & Oxygen Therapy: Patient Spontanous Breathing and Patient connected to face mask oxygen  Post-op Assessment: Report given to RN and Post -op Vital signs reviewed and stable  Post vital signs: Reviewed and stable  Last Vitals:  Vitals Value Taken Time  BP    Temp    Pulse    Resp    SpO2      Last Pain:  Vitals:   06/24/20 1008  TempSrc: Oral  PainSc: 0-No pain         Complications: No complications documented.

## 2020-06-24 NOTE — Anesthesia Procedure Notes (Signed)
Procedure Name: Intubation Date/Time: 06/24/2020 11:08 AM Performed by: Candis Shine, CRNA Pre-anesthesia Checklist: Patient identified, Emergency Drugs available, Suction available and Patient being monitored Patient Re-evaluated:Patient Re-evaluated prior to induction Oxygen Delivery Method: Circle System Utilized Preoxygenation: Pre-oxygenation with 100% oxygen Induction Type: IV induction Ventilation: Mask ventilation without difficulty Laryngoscope Size: Mac and 4 Grade View: Grade I Tube type: Oral Tube size: 7.5 mm Number of attempts: 1 Airway Equipment and Method: Stylet Placement Confirmation: ETT inserted through vocal cords under direct vision,  positive ETCO2 and breath sounds checked- equal and bilateral Secured at: 22 cm Tube secured with: Tape Dental Injury: Teeth and Oropharynx as per pre-operative assessment

## 2020-06-24 NOTE — Anesthesia Postprocedure Evaluation (Signed)
Anesthesia Post Note  Patient: Terry Gregory  Procedure(s) Performed: ENDOSCOPIC RETROGRADE CHOLANGIOPANCREATOGRAPHY (ERCP) (N/A ) SPHINCTEROTOMY REMOVAL OF STONES STONE EXTRACTION WITH BASKET     Patient location during evaluation: Endoscopy Anesthesia Type: General Level of consciousness: awake and sedated Pain management: pain level controlled Vital Signs Assessment: post-procedure vital signs reviewed and stable Respiratory status: spontaneous breathing Cardiovascular status: stable Postop Assessment: no apparent nausea or vomiting Anesthetic complications: no   No complications documented.  Last Vitals:  Vitals:   06/24/20 1218 06/24/20 1228  BP: (!) 174/79 (!) 167/64  Pulse: 78 79  Resp: 19 20  Temp: 36.8 C   SpO2: 95% 95%    Last Pain:  Vitals:   06/24/20 1228  TempSrc:   PainSc: 0-No pain                 Huston Foley

## 2020-06-24 NOTE — Op Note (Signed)
[facility] Patient Name: Terry Gregory Procedure Date : 06/24/2020 MRN: [id number] Attending MD: Ronnette Juniper , MD Date of Birth: 11/24/1921 CSN: [id number] Age: 87 Admit Type: Inpatient Procedure:                ERCP Indications:              Common bile duct stone(s), Abdominal pain of                            suspected biliary origin, Abnormal MRCP, Elevated                            liver enzymes Providers:                Ronnette Juniper, MD, Josie Dixon, RN, Tyna Jaksch                            Technician Referring MD:             Sheliah Plane Hospitalist, PCP- Lesly Dukes Medicines:                Monitored Anesthesia Care Complications:            No immediate complications. Estimated Blood Loss:     Estimated blood loss: none. Procedure:                Pre-Anesthesia Assessment:                           - Prior to the procedure, a History and Physical                            was performed, and patient medications and                            allergies were reviewed. The patient's tolerance of                            previous anesthesia was also reviewed. The risks                            and benefits of the procedure and the sedation                            options and risks were discussed with the patient.                            All questions were answered, and informed consent                            was obtained. Prior Anticoagulants: The patient has                            taken no previous anticoagulant or antiplatelet                            agents  except for aspirin. ASA Grade Assessment:                            III - A patient with severe systemic disease. After                            reviewing the risks and benefits, the patient was                            deemed in satisfactory condition to undergo the                            procedure.                           After obtaining informed consent, the scope  was                            passed under direct vision. Throughout the                            procedure, the patient's blood pressure, pulse, and                            oxygen saturations were monitored continuously. The                            TJF- Q180V ([id number]) Olympus duodenoscope was                            introduced through the mouth, and used to inject                            contrast into and used to inject contrast into the                            bile duct. The ERCP was accomplished without                            difficulty. The patient tolerated the procedure                            well. Scope In: Scope Out: Findings:      The scout film was normal. The esophagus was successfully intubated       under direct vision. The scope was advanced to a normal major papilla in       the descending duodenum without detailed examination of the pharynx,       larynx and associated structures, and upper GI tract. The upper GI tract       was grossly normal.      The bile duct was deeply cannulated with the sphincterotome. Contrast       was injected. I personally interpreted the bile duct images. There was       brisk flow of contrast through the ducts. Image quality  was excellent.       Contrast extended to the main bile duct.      The lower third of the main bile duct contained one stone, which was 10       mm in diameter. The main bile duct was mildly dilated. The largest       diameter was 9 mm.      A straight Roadrunner wire was passed into the biliary tree.      A 10 mm biliary sphincterotomy was made with a braided sphincterotome       using ERBE electrocautery. There was no post-sphincterotomy bleeding.      The biliary tree was swept with a 12 mm balloon starting at the       bifurcation. Sludge was swept from the duct.      The stone was too large for removal with a balloon sweep.      The biliary tree was swept with a 2 cm basket starting at  the lower       third of the main duct, this helped to crush the stone in several       pieces. Subsuquently a large amount of crushed stone and sludge was       swept from the duct. All stones were removed.      12 mm balloon was used to sweep the common bile duct several times and       on obstructive cholangiogram, no futher filling defects were noted.      The pancreatic duct was not injected or canulated during the procedure.      The patient was given rectal indomethacin prior to start of the       procedure. Impression:               - The entire main bile duct was mildly dilated.                           - Choledocholithiasis was found. Complete removal                            was accomplished by biliary sphincterotomy and                            balloon and basket extraction.                           - A biliary sphincterotomy was performed.                           - The biliary tree was swept with balloon and                            sludge was found.                           - The biliary tree was swept with basket and stone                            was removed. Moderate Sedation:      Patient did not receive moderate sedation for this procedure, but  instead received monitored anesthesia care. Recommendation:           - Clear liquid diet today. If no abdominal pain                            noted in a few hours, ok to advance to regular diet. Procedure Code(s):        --- Professional ---                           [phone], Endoscopic retrograde                            cholangiopancreatography (ERCP); with removal of                            calculi/debris from biliary/pancreatic duct(s)                           43262, Endoscopic retrograde                            cholangiopancreatography (ERCP); with                            sphincterotomy/papillotomy Diagnosis Code(s):        --- Professional ---                           K80.50, Calculus of  bile duct without cholangitis                            or cholecystitis without obstruction                           R10.9, Unspecified abdominal pain                           R74.8, Abnormal levels of other serum enzymes                           K83.8, Other specified diseases of biliary tract                           R93.2, Abnormal findings on diagnostic imaging of                            liver and biliary tract CPT copyright 2019 [employer]. All rights reserved. The codes documented in this report are preliminary and upon coder review may  be revised to meet current compliance requirements. Ronnette Juniper, MD 06/24/2020 12:17:05 PM This report has been signed electronically. Number of Addenda: 0

## 2020-06-25 LAB — CBC WITH DIFFERENTIAL/PLATELET
Abs Immature Granulocytes: 0.05 10*3/uL (ref 0.00–0.07)
Basophils Absolute: 0 10*3/uL (ref 0.0–0.1)
Basophils Relative: 0 %
Eosinophils Absolute: 0.1 10*3/uL (ref 0.0–0.5)
Eosinophils Relative: 1 %
HCT: 33.9 % — ABNORMAL LOW (ref 39.0–52.0)
Hemoglobin: 11.2 g/dL — ABNORMAL LOW (ref 13.0–17.0)
Immature Granulocytes: 1 %
Lymphocytes Relative: 12 %
Lymphs Abs: 1 10*3/uL (ref 0.7–4.0)
MCH: 33.5 pg (ref 26.0–34.0)
MCHC: 33 g/dL (ref 30.0–36.0)
MCV: 101.5 fL — ABNORMAL HIGH (ref 80.0–100.0)
Monocytes Absolute: 0.5 10*3/uL (ref 0.1–1.0)
Monocytes Relative: 6 %
Neutro Abs: 6.4 10*3/uL (ref 1.7–7.7)
Neutrophils Relative %: 80 %
Platelets: 122 10*3/uL — ABNORMAL LOW (ref 150–400)
RBC: 3.34 MIL/uL — ABNORMAL LOW (ref 4.22–5.81)
RDW: 13.5 % (ref 11.5–15.5)
WBC: 8.1 10*3/uL (ref 4.0–10.5)
nRBC: 0 % (ref 0.0–0.2)

## 2020-06-25 LAB — COMPREHENSIVE METABOLIC PANEL
ALT: 228 U/L — ABNORMAL HIGH (ref 0–44)
AST: 160 U/L — ABNORMAL HIGH (ref 15–41)
Albumin: 2.8 g/dL — ABNORMAL LOW (ref 3.5–5.0)
Alkaline Phosphatase: 125 U/L (ref 38–126)
Anion gap: 7 (ref 5–15)
BUN: 20 mg/dL (ref 8–23)
CO2: 24 mmol/L (ref 22–32)
Calcium: 8.3 mg/dL — ABNORMAL LOW (ref 8.9–10.3)
Chloride: 105 mmol/L (ref 98–111)
Creatinine, Ser: 0.98 mg/dL (ref 0.61–1.24)
GFR, Estimated: >60 mL/min (ref >60)
Glucose, Bld: 112 mg/dL — ABNORMAL HIGH (ref 70–99)
Potassium: 4.4 mmol/L (ref 3.5–5.1)
Sodium: 136 mmol/L (ref 135–145)
Total Bilirubin: 1.3 mg/dL — ABNORMAL HIGH (ref 0.3–1.2)
Total Protein: 5.5 g/dL — ABNORMAL LOW (ref 6.5–8.1)

## 2020-06-25 NOTE — Plan of Care (Signed)
  Problem: Education: Goal: Knowledge of General Education information will improve Description: Including pain rating scale, medication(s)/side effects and non-pharmacologic comfort measures Outcome: Progressing   Problem: Health Behavior/Discharge Planning: Goal: Ability to manage health-related needs will improve Outcome: Progressing   Problem: Clinical Measurements: Goal: Ability to maintain clinical measurements within normal limits will improve Outcome: Progressing Goal: Will remain free from infection Outcome: Progressing Goal: Diagnostic test results will improve Outcome: Progressing Goal: Respiratory complications will improve Outcome: Progressing Goal: Cardiovascular complication will be avoided Outcome: Progressing   Problem: Coping: Goal: Level of anxiety will decrease Outcome: Progressing   Problem: Nutrition: Goal: Adequate nutrition will be maintained Outcome: Progressing   Problem: Elimination: Goal: Will not experience complications related to bowel motility Outcome: Progressing Goal: Will not experience complications related to urinary retention Outcome: Progressing   Problem: Pain Managment: Goal: General experience of comfort will improve Outcome: Progressing

## 2020-06-25 NOTE — Evaluation (Signed)
Physical Therapy Evaluation/ Discharge Patient Details Name: Terry Gregory MRN: [id number] DOB: May 30, 1921 Today's Date: 06/25/2020   History of Present Illness  87 yo admitted 5/11 with acute abdominal pain and near syncope and elevated LFTs. 5/12 MRCP with stacked stones noted. 5/13 ERCP with stone removal. 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 Pt requires assist to stand from low surfaces and reliant on RW for gait. Daughter assists with all ADLs and performs iADLs. Pt and daughter educated for transfer cues and assist, stair management and HEP. Both agree that at this time pt demonstrating baseline mobility with gait and stairs, verbalize understanding of education and agree to no further therapy needs at this time. Will sign off with continued mobility with assist encouraged.     Follow Up Recommendations No PT follow up;Supervision for mobility/OOB    Equipment Recommendations  None recommended by PT    Recommendations for Other Services       Precautions / Restrictions Precautions Precautions: Fall      Mobility  Bed Mobility               General bed mobility comments: in chair on arrival and end of session    Transfers Overall transfer level: Needs assistance   Transfers: Sit to/from Stand Sit to Stand: Min guard;Min assist         General transfer comment: min assist from low recliner, minguard to rise from [facility]  Ambulation/Gait Ambulation/Gait assistance: Min guard Gait Distance (Feet): 170 Feet Assistive device: Rolling walker (2 wheeled) Gait Pattern/deviations: Step-through pattern;Decreased stride length;Trunk flexed   Gait velocity interpretation: 1.31 - 2.62 ft/sec, indicative of limited community ambulator General Gait Details: cues for posture and proximity to  RW- daughter reports baseline with maintained bil knee partial flexion  Stairs Stairs: Yes Stairs assistance: Min guard Stair Management: Step to pattern;Forwards;One rail Left Number of Stairs: 2 General stair comments: pt with bil hands on left rail to ascend with cues that sidestepping may be easier than rotated trunk  Wheelchair Mobility    Modified Rankin (Stroke Patients Only)       Balance Overall balance assessment: Needs assistance Sitting-balance support: No upper extremity supported;Feet supported Sitting balance-Leahy Scale: Fair     Standing balance support: Bilateral upper extremity supported Standing balance-Leahy Scale: Poor Standing balance comment: reliant on rail or RW in standing                             Pertinent Vitals/Pain Pain Assessment: No/denies pain    Home Living Family/patient expects to be discharged to:: Private residence Living Arrangements: Children Available Help at Discharge: Family;Available 24 hours/day Type of Home: House Home Access: Stairs to enter   [employer] of Steps: 2 Home Layout: One level Home Equipment: Walker - 2 wheels;Toilet riser;Grab bars - toilet;Grab bars - tub/shower;Walker - 4 wheels      Prior Function Level of Independence: Needs assistance   Gait / Transfers Assistance Needed: walks with RW  ADL's / Homemaking Assistance Needed: assist for bathing and dressing, stands in shower and daughter bathes him        Hand Dominance        Extremity/Trunk Assessment   Upper Extremity Assessment Upper Extremity Assessment: Generalized weakness  Lower Extremity Assessment Lower Extremity Assessment: Generalized weakness (pt maintains grossly 15 degrees of knee flexion at all times)    Cervical / Trunk Assessment Cervical / Trunk Assessment: Kyphotic  Communication   Communication: HOH  Cognition Arousal/Alertness: Awake/alert Behavior During Therapy: WFL for tasks  assessed/performed Overall Cognitive Status: Within Functional Limits for tasks assessed                                        General Comments      Exercises     Assessment/Plan    PT Assessment Patent does not need any further PT services  PT Problem List         PT Treatment Interventions      PT Goals (Current goals can be found in the Care Plan section)  Acute Rehab PT Goals PT Goal Formulation: All assessment and education complete, DC therapy    Frequency     Barriers to discharge        Co-evaluation               AM-PAC PT "6 Clicks" Mobility  Outcome Measure Help needed turning from your back to your side while in a flat bed without using bedrails?: A Little Help needed moving from lying on your back to sitting on the side of a flat bed without using bedrails?: A Little Help needed moving to and from a bed to a chair (including a wheelchair)?: A Little Help needed standing up from a chair using your arms (e.g., wheelchair or bedside chair)?: A Little Help needed to walk in hospital room?: A Little Help needed climbing 3-5 steps with a railing? : A Little 6 Click Score: 18    End of Session Equipment Utilized During Treatment: Gait belt Activity Tolerance: Patient tolerated treatment well Patient left: in chair;with call bell/phone within reach;with family/visitor present Nurse Communication: Mobility status PT Visit Diagnosis: Other abnormalities of gait and mobility (R26.89);Difficulty in walking, not elsewhere classified (R26.2)    Time: 3382-5053 PT Time Calculation (min) (ACUTE ONLY): 29 min   Charges:   PT Evaluation $PT Eval Moderate Complexity: 1 Mod PT Treatments $Therapeutic Activity: 8-22 mins        Stpehen Petitjean P, PT Acute Rehabilitation Services Pager: [phone] Office: [place] 06/25/2020, 12:46 PM

## 2020-06-25 NOTE — Progress Notes (Signed)
Terry Gregory 9:07 AM  Subjective: Patient seen and examined and case discussed with my partner Dr. Therisa Doyne in hospital computer chart reviewed including his ERCP report and pictures and case discussed with his daughter and son and he has no complaint and no problems from his procedure and is eating breakfast okay we answered all their questions including some rare dysphagia in his esophagus and discussion of aspirin induced gastritis  Objective: Vital signs stable afebrile no acute distress abdomen is soft nontender elderly pleasant in good spirits white count decreased hemoglobin okay LFTs decreased BUN and creatinine decreased  Assessment: Status post ERCP and stone extraction  Plan: Follow liver tests back to normal Dr. Cristina Gong happy to see back in the office in a week or 2 and please call us if we can be of any further assistance with this hospital stay and we discussed holding off on aspirin for his sphincterotomy as well as possibly the gastritis for a week if okay with cardiology  [facility]  office [phone] After 5PM or if no answer call [phone]

## 2020-06-25 NOTE — Progress Notes (Signed)
PROGRESS NOTE    Terry Gregory  RCB:[id number] DOB: September 09, 1921 DOA: 06/22/2020 PCP: Burnard Bunting, MD   Brief Narrative:  Terry Gregory is a 87 y.o. 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  In the ED, afebrile. WBC 12.1,  AST 1074, ALT 485, alk phos 211, T bili 2.3.  Lipase normal.  INR 1.2.  Acute hepatitis panel negative.  Acetaminophen level undetectable.  Screening COVID and influenza PCR negative.  CT abdomen did not reveal cause of acute abdominal pain and elevated LFTs.  Right upper quadrant ultrasound showing no acute abnormality. ED physician discussed the case with Dr. Cristina Gong from gastroenterology who recommended admission for antibiotics.  Underwent MRCP on 06/23/2020 which showed filling defect in distal CBD representing multiple stacked stones.  Underwent ERCP 06/24/2020 with removal of large CBD stone.   Assessment & Plan:   Principal Problem:   Elevated LFTs Active Problems:   HTN (hypertension)   Near syncope   BPH (benign prostatic hyperplasia)  Acute abdominal pain/elevated LFTs: (AST 1074, ALT 485, alk phos 211, T bili 2.3).  Lipase normal.  Acetaminophen level undetectable.  CT abdomen did not reveal cause of acute abdominal pain and elevated LFTs.  Right upper quadrant ultrasound showing no acute abnormality.  Seen by GI.  S/p MRCP 06/23/2020 which showed filling defect in distal CBD representing multiple stacked stones.  Underwent ERCP 06/24/2020  with removal of large CBD stone.  LFTs have improved compared to yesterday but is still  significantly elevated.  No pain.  GI recommends continuing antibiotics until discharge.  Will advance to regular diet.  Near syncope: Likely vasovagal.  No further episodes since admission.  Heart rate within normal range.  Hypertension: Fairly controlled.  Continue finasteride.  BPH: Continue finasteride.  Deconditioning/generalized weakness: Consulted PT OT.  DVT prophylaxis: SCDs Start: 06/23/20 0359   Code Status: Full Code  Family Communication: Daughter present at bedside.  Plan of care discussed with patient in length with patient and daughter and both verbalized understanding and agreed with it.    Status is: Inpatient  Remains inpatient appropriate because:Ongoing diagnostic testing needed not appropriate for outpatient work up   Dispo: The patient is from: Home              Anticipated d/c is to: Home              Patient currently is not medically stable to d/c.   Difficult to place patient No        Estimated body mass index is 24.99 kg/m as calculated from the following:   Height as of this encounter: '6\' 3"'  (1.905 m).   Weight as of this encounter: 90.7 kg.      Nutritional status:               Consultants:   GI  Procedures:   None  Antimicrobials:  Anti-infectives (From admission, onward)   Start     Dose/Rate Route Frequency Ordered Stop   06/23/20 2200  levofloxacin (LEVAQUIN) IVPB 750 mg  Status:  Discontinued        750 mg 100 mL/hr over 90 Minutes Intravenous  Every 24 hours 06/22/20 2240 06/23/20 1351   06/23/20 2200  Levofloxacin (LEVAQUIN) IVPB 250 mg        250 mg 50 mL/hr over 60 Minutes Intravenous Every 24 hours 06/23/20 1351     06/23/20 0600  metroNIDAZOLE (FLAGYL) IVPB 500 mg        500 mg 100 mL/hr over 60 Minutes Intravenous Every 8 hours 06/23/20 0401     06/22/20 2230  levofloxacin (LEVAQUIN) IVPB 750 mg        750 mg 100 mL/hr over 90 Minutes Intravenous  Once 06/22/20 2222 06/23/20 0020   06/22/20 2230   metroNIDAZOLE (FLAGYL) IVPB 500 mg        500 mg 100 mL/hr over 60 Minutes Intravenous  Once 06/22/20 2222 06/23/20 0122         Subjective: Seen and examined.  Daughter at the bedside.  Feels better.  Daughter at the bedside.  No abdominal pain or nausea.  Feels a little bit weak.  Objective: Vitals:   06/24/20 2012 06/25/20 0525 06/25/20 0739 06/25/20 0806  BP: 121/71 125/73 (!) 149/78 (!) 149/78  Pulse: 75 64 67 67  Resp: '17 17 17 17  ' Temp: 98.2 F (36.8 C) 99.3 F (37.4 C) 98.3 F (36.8 C) 98.3 F (36.8 C)  TempSrc: Oral Axillary Oral Oral  SpO2: 97% 93% 94% 94%  Weight:      Height:        Intake/Output Summary (Last 24 hours) at 06/25/2020 1125 Last data filed at 06/25/2020 0537 Gross per 24 hour  Intake 500 ml  Output 500 ml  Net 0 ml   Filed Weights   06/22/20 1844 06/24/20 1008  Weight: 90.7 kg 90.7 kg    Examination:  General exam: Appears calm and comfortable  Respiratory system: Clear to auscultation. Respiratory effort normal. Cardiovascular system: S1 & S2 heard, RRR. No JVD, murmurs, rubs, gallops or clicks. No pedal edema. Gastrointestinal system: Abdomen is nondistended, soft and nontender. No organomegaly or masses felt. Normal bowel sounds heard. Central nervous system: Alert and oriented. No focal neurological deficits. Extremities: Symmetric 5 x 5 power. Skin: No rashes, lesions or ulcers.  Psychiatry: Judgement and insight appear normal. Mood & affect appropriate.    Data Reviewed: I have personally reviewed following labs and imaging studies  CBC: Recent Labs  Lab 06/22/20 1849 06/23/20 0611 06/24/20 0832 06/25/20 0339  WBC 12.1* 19.6* 13.2* 8.1  NEUTROABS 11.7*  --  11.2* 6.4  HGB 12.4* 10.8* 10.7* 11.2*  HCT 36.3* 32.5* 33.4* 33.9*  MCV 100.3* 100.9* 101.8* 101.5*  PLT 155 130* 125* 161*   Basic Metabolic Panel: Recent Labs  Lab 06/22/20 1849 06/23/20 0611 06/24/20 0335 06/25/20 0339  NA 134* 136 136 136  K 3.8 4.2 3.6  4.4  CL 101 105 104 105  CO2 '23 22 23 24  ' GLUCOSE 147* 115* 86 112*  BUN 29* 29* 26* 20  CREATININE 0.99 1.20 1.09 0.98  CALCIUM 8.6* 8.4* 8.5* 8.3*  MG  --  1.9  --   --    GFR: Estimated Creatinine Clearance: 51.5 mL/min (by C-G formula based on SCr of 0.98 mg/dL). Liver Function Tests: Recent Labs  Lab 06/22/20 1849 06/23/20 0611 06/24/20 0335 06/25/20 0339  AST 1,074* 713* 327* 160*  ALT 485* 514* 342* 228*  ALKPHOS 211* 150* 139* 125  BILITOT 2.3* 3.1* 2.3* 1.3*  PROT 6.9 5.6* 5.5* 5.5*  ALBUMIN 3.6 3.0* 2.8* 2.8*   Recent Labs  Lab  06/22/20 1849  LIPASE 24   No results for input(s): AMMONIA in the last 168 hours. Coagulation Profile: Recent Labs  Lab 06/22/20 1952  INR 1.2   Cardiac Enzymes: No results for input(s): CKTOTAL, CKMB, CKMBINDEX, TROPONINI in the last 168 hours. BNP (last 3 results) No results for input(s): PROBNP in the last 8760 hours. HbA1C: No results for input(s): HGBA1C in the last 72 hours. CBG: No results for input(s): GLUCAP in the last 168 hours. Lipid Profile: No results for input(s): CHOL, HDL, LDLCALC, TRIG, CHOLHDL, LDLDIRECT in the last 72 hours. Thyroid Function Tests: No results for input(s): TSH, T4TOTAL, FREET4, T3FREE, THYROIDAB in the last 72 hours. Anemia Panel: No results for input(s): VITAMINB12, FOLATE, FERRITIN, TIBC, IRON, RETICCTPCT in the last 72 hours. Sepsis Labs: Recent Labs  Lab 06/23/20 3710 06/23/20 1710  LATICACIDVEN 2.1* 0.9    Recent Results (from the past 240 hour(s))  Resp Panel by RT-PCR (Flu A&B, Covid) Nasopharyngeal Swab     Status: None   Collection Time: 06/22/20 10:40 PM   Specimen: Nasopharyngeal Swab; Nasopharyngeal(NP) swabs in vial transport medium  Result Value Ref Range Status   SARS Coronavirus 2 by RT PCR NEGATIVE NEGATIVE Final    Comment: (NOTE) SARS-CoV-2 target nucleic acids are NOT DETECTED.  The SARS-CoV-2 RNA is generally detectable in upper respiratory specimens during  the acute phase of infection. The lowest concentration of SARS-CoV-2 viral copies this assay can detect is 138 copies/mL. A negative result does not preclude SARS-Cov-2 infection and should not be used as the sole basis for treatment or other patient management decisions. A negative result may occur with  improper specimen collection/handling, submission of specimen other than nasopharyngeal swab, presence of viral mutation(s) within the areas targeted by this assay, and inadequate number of viral copies(<138 copies/mL). A negative result must be combined with clinical observations, patient history, and epidemiological information. The expected result is Negative.  Fact Sheet for Patients:  EntrepreneurPulse.com.au  Fact Sheet for Healthcare Providers:  IncredibleEmployment.be  This test is no t yet approved or cleared by the [place] FDA and  has been authorized for detection and/or diagnosis of SARS-CoV-2 by FDA under an Emergency Use Authorization (EUA). This EUA will remain  in effect (meaning this test can be used) for the duration of the COVID-19 declaration under Section 564(b)(1) of the Act, 21 U.S.C.section 360bbb-3(b)(1), unless the authorization is terminated  or revoked sooner.       Influenza A by PCR NEGATIVE NEGATIVE Final   Influenza B by PCR NEGATIVE NEGATIVE Final    Comment: (NOTE) The Xpert Xpress SARS-CoV-2/FLU/RSV plus assay is intended as an aid in the diagnosis of influenza from Nasopharyngeal swab specimens and should not be used as a sole basis for treatment. Nasal washings and aspirates are unacceptable for Xpert Xpress SARS-CoV-2/FLU/RSV testing.  Fact Sheet for Patients: EntrepreneurPulse.com.au  Fact Sheet for Healthcare Providers: IncredibleEmployment.be  This test is not yet approved or cleared by the [place] FDA and has been authorized for detection and/or  diagnosis of SARS-CoV-2 by FDA under an Emergency Use Authorization (EUA). This EUA will remain in effect (meaning this test can be used) for the duration of the COVID-19 declaration under Section 564(b)(1) of the Act, 21 U.S.C. section 360bbb-3(b)(1), unless the authorization is terminated or revoked.  Performed at [facility], [address], [place], [place] [address]       Radiology Studies: MR 3D Recon At Scanner  Result Date: 06/24/2020 CLINICAL  DATA:  Right upper quadrant abdominal pain. Status post cholecystectomy. EXAM: MRI ABDOMEN WITHOUT AND WITH CONTRAST (INCLUDING MRCP) TECHNIQUE: Multiplanar multisequence MR imaging of the abdomen was performed both before and after the administration of intravenous contrast. Heavily T2-weighted images of the biliary and pancreatic ducts were obtained, and three-dimensional MRCP images were rendered by post processing. CONTRAST:  42m GADAVIST GADOBUTROL 1 MMOL/ML IV SOLN COMPARISON:  CT AP 11 scratch set CT AP 06/22/2020 and right upper quadrant ultrasound 06/22/2020 FINDINGS: Lower chest: New small bilateral pleural effusions. Hepatobiliary: Exam detail diminished due to motion artifact. No focal liver abnormality identified. Status post cholecystectomy. Mild intrahepatic bile duct dilatation. The common bile duct is increased in caliber measuring 8 mm, image 10/16. Filling defect within the distal CBD is identified measuring 1 cm in length and 4 mm in diameter. This may represent 1 elongated stone or multiple stacked stones. Pancreas: No main duct dilatation. No focal fluid collection or mass identified. The equivocal edema. Spleen:  Within normal limits in size and appearance. Adrenals/Urinary Tract: Normal adrenal glands. No kidney mass or hydronephrosis. Stomach/Bowel: Visualized portions within the abdomen are unremarkable. Vascular/Lymphatic: No pathologically enlarged lymph nodes identified. No abdominal aortic aneurysm demonstrated.  Other: No free fluid or fluid collections. Trace retroperitoneal fluid/edema, nonspecific. Musculoskeletal: No suspicious bone lesions identified. IMPRESSION: 1. Exam detail diminished due to motion artifact. 2. Filling defect within the distal CBD may represent 1 cm elongated stone or multiple stacked stones. 3. Mild intrahepatic and common bile duct dilatation. 4. Equivocal edema within the pancreas. Correlate for any clinical signs of pancreatitis. 5. New small bilateral pleural effusions. Mild edema within the mesenteric fat and retroperitoneal fat. Correlate for any clinical signs or symptoms of CHF/fluid overload. 6. These results will be called to the ordering clinician or representative by the Radiologist Assistant, and communication documented in the PACS or [employer] Electronically Signed   By: TKerby MoorsM.D.   On: 06/24/2020 07:51   DG ERCP  Result Date: 06/24/2020 CLINICAL DATA:  87year old male with choledocholithiasis EXAM: ERCP TECHNIQUE: Multiple spot images obtained with the fluoroscopic device and submitted for interpretation post-procedure. FLUOROSCOPY TIME:  Fluoroscopy Time:  4 minutes 32 seconds Radiation Exposure Index (if provided by the fluoroscopic device): 65.2 mGy Number of Acquired Spot Images: 0 COMPARISON:  MRCP 06/23/2020 FINDINGS: A total of 8 intraoperative saved images are submitted for review. The images demonstrate a flexible duodenal scope in the descending duodenum with wire cannulation of the left intrahepatic ducts. Subsequent cholangiogram demonstrates mild biliary ductal dilatation and surgical changes of cholecystectomy. Filling defect present within the distal common bile duct consistent with choledocholithiasis. Subsequent images document sphincterotomy and balloon sweeping of the common duct. IMPRESSION: 1. Choledocholithiasis. 2. ERCP with sphincterotomy and balloon sweep of the common duct. These images were submitted for radiologic interpretation  only. Please see the procedural report for the amount of contrast and the fluoroscopy time utilized. Electronically Signed   By: HJacqulynn CadetM.D.   On: 06/24/2020 12:35   MR ABDOMEN MRCP W WO CONTAST  Result Date: 06/24/2020 CLINICAL DATA:  Right upper quadrant abdominal pain. Status post cholecystectomy. EXAM: MRI ABDOMEN WITHOUT AND WITH CONTRAST (INCLUDING MRCP) TECHNIQUE: Multiplanar multisequence MR imaging of the abdomen was performed both before and after the administration of intravenous contrast. Heavily T2-weighted images of the biliary and pancreatic ducts were obtained, and three-dimensional MRCP images were rendered by post processing. CONTRAST:  912mGADAVIST GADOBUTROL 1 MMOL/ML IV SOLN COMPARISON:  CT AP  11 scratch set CT AP 06/22/2020 and right upper quadrant ultrasound 06/22/2020 FINDINGS: Lower chest: New small bilateral pleural effusions. Hepatobiliary: Exam detail diminished due to motion artifact. No focal liver abnormality identified. Status post cholecystectomy. Mild intrahepatic bile duct dilatation. The common bile duct is increased in caliber measuring 8 mm, image 10/16. Filling defect within the distal CBD is identified measuring 1 cm in length and 4 mm in diameter. This may represent 1 elongated stone or multiple stacked stones. Pancreas: No main duct dilatation. No focal fluid collection or mass identified. The equivocal edema. Spleen:  Within normal limits in size and appearance. Adrenals/Urinary Tract: Normal adrenal glands. No kidney mass or hydronephrosis. Stomach/Bowel: Visualized portions within the abdomen are unremarkable. Vascular/Lymphatic: No pathologically enlarged lymph nodes identified. No abdominal aortic aneurysm demonstrated. Other: No free fluid or fluid collections. Trace retroperitoneal fluid/edema, nonspecific. Musculoskeletal: No suspicious bone lesions identified. IMPRESSION: 1. Exam detail diminished due to motion artifact. 2. Filling defect within the  distal CBD may represent 1 cm elongated stone or multiple stacked stones. 3. Mild intrahepatic and common bile duct dilatation. 4. Equivocal edema within the pancreas. Correlate for any clinical signs of pancreatitis. 5. New small bilateral pleural effusions. Mild edema within the mesenteric fat and retroperitoneal fat. Correlate for any clinical signs or symptoms of CHF/fluid overload. 6. These results will be called to the ordering clinician or representative by the Radiologist Assistant, and communication documented in the PACS or [employer]. Electronically Signed   By: Kerby Moors M.D.   On: 06/24/2020 07:51    Scheduled Meds: . finasteride  5 mg Oral Daily  . pantoprazole  40 mg Oral Daily  . sodium chloride flush  3 mL Intravenous Q12H   Continuous Infusions: . levofloxacin (LEVAQUIN) IV 250 mg (06/24/20 2242)  . metronidazole 500 mg (06/25/20 0605)     LOS: 2 days   Time spent: 28 minutes   Darliss Cheney, MD [facility]  06/25/2020, 11:25 AM   How to contact the [facility] Attending or Consulting provider [place] or covering provider during after hours [place], for this patient?  1. Check the care team in [facility] and look for a) attending/consulting [facility] provider listed and b) the [facility] team listed. Page or secure chat [phone]. 2. Log into [email or web address] and use [place]'s universal password to access. If you do not have the password, please contact the hospital operator. 3. Locate the [facility] provider you are looking for under [facility] and page to a number that you can be directly reached. 4. If you still have difficulty reaching the provider, please page the [facility] (Director on Call) for the Hospitalists listed on amion for assistance.

## 2020-06-25 NOTE — Progress Notes (Signed)
OT Cancellation Note  Patient Details Name: Terry Gregory MRN: [id number] DOB: 1921-08-08   Cancelled Treatment:    Reason Eval/Treat Not Completed: OT screened, no needs identified, will sign off.  Per daughter, patient is at her baseline for ADL/IADL completion.  No acute OT needs identified.   Linkon Siverson D Roshell Brigham 06/25/2020, 3:51 PM

## 2020-06-26 ENCOUNTER — Encounter (HOSPITAL_COMMUNITY): Payer: Self-pay | Admitting: Gastroenterology

## 2020-06-26 DIAGNOSIS — K8050 Calculus of bile duct without cholangitis or cholecystitis without obstruction: Secondary | ICD-10-CM

## 2020-06-26 DIAGNOSIS — K831 Obstruction of bile duct: Secondary | ICD-10-CM

## 2020-06-26 LAB — COMPREHENSIVE METABOLIC PANEL
ALT: 157 U/L — ABNORMAL HIGH (ref 0–44)
AST: 83 U/L — ABNORMAL HIGH (ref 15–41)
Albumin: 2.6 g/dL — ABNORMAL LOW (ref 3.5–5.0)
Alkaline Phosphatase: 121 U/L (ref 38–126)
Anion gap: 7 (ref 5–15)
BUN: 14 mg/dL (ref 8–23)
CO2: 27 mmol/L (ref 22–32)
Calcium: 8.7 mg/dL — ABNORMAL LOW (ref 8.9–10.3)
Chloride: 104 mmol/L (ref 98–111)
Creatinine, Ser: 0.89 mg/dL (ref 0.61–1.24)
GFR, Estimated: >60 mL/min (ref >60)
Glucose, Bld: 108 mg/dL — ABNORMAL HIGH (ref 70–99)
Potassium: 3.9 mmol/L (ref 3.5–5.1)
Sodium: 138 mmol/L (ref 135–145)
Total Bilirubin: 1 mg/dL (ref 0.3–1.2)
Total Protein: 5.4 g/dL — ABNORMAL LOW (ref 6.5–8.1)

## 2020-06-26 LAB — MAGNESIUM: Magnesium: 1.8 mg/dL (ref 1.7–2.4)

## 2020-06-26 MED ORDER — LEVOFLOXACIN 500 MG PO TABS: 750.0000 mg | ORAL_TABLET | Freq: Every day | ORAL | Status: DC

## 2020-06-26 MED ORDER — METRONIDAZOLE 500 MG PO TABS: 500.0000 mg | ORAL_TABLET | Freq: Three times a day (TID) | ORAL | Status: DC

## 2020-06-26 NOTE — Progress Notes (Signed)
Pharmacy Antibiotic Note  Terry Gregory is a 87 y.o. male admitted on 06/22/2020 with intra-abdominal infection.  Pharmacy has been consulted for Levaquin dosing. On metronidazole per MD.  Day 5 abx. S/p ERCP with removal of CBD stone on 5/13. Patient remains afebrile, WBC wnl. Renal function improved, back to baseline. LFTs trending down, advanced to regular diet.    Plan: Will transition abx to PO given improved oral intake Increase Levaquin dose to 750 mg PO q24h with improved renal function Continue Flagyl per MD Monitor renal function, f/u LOT  Height: 6\' 3"  (190.5 cm) Weight: 90.7 kg (199 lb 15.3 oz) IBW/kg (Calculated) : 84.5  Temp (24hrs), Avg:97.9 F (36.6 C), Min:97.4 F (36.3 C), Max:98.3 F (36.8 C)  Recent Labs  Lab 06/22/20 1849 06/23/20 0611 06/23/20 1710 06/24/20 0335 06/24/20 0832 06/25/20 0339 06/26/20 0233  WBC 12.1* 19.6*  --   --  13.2* 8.1  --   CREATININE 0.99 1.20  --  1.09  --  0.98 0.89  LATICACIDVEN  --  2.1* 0.9  --   --   --   --     Estimated Creatinine Clearance: 56.7 mL/min (by C-G formula based on SCr of 0.89 mg/dL).    Allergies  Allergen Reactions  . Erythromycin Hives and Rash  . Penicillins Hives and Rash    .Marland KitchenHas patient had a PCN reaction causing immediate rash, facial/tongue/throat swelling, SOB or lightheadedness with hypotension: Yes Has patient had a PCN reaction causing severe rash involving mucus membranes or skin necrosis: No Has patient had a PCN reaction that required hospitalization No Has patient had a PCN reaction occurring within the last 10 years: No If all of the above answers are "NO", then may proceed with Cephalosporin use.    Antimicrobials this admission: Levaquin 5/11>> Metronidazole 5/12>>  Microbiology results: No cultures ordered at this time  Thank you for allowing pharmacy to be a part of this patient's care.  Berenice Bouton, PharmD, MS, BCPS PGY2 Pharmacy Resident Phone between 7 am - 3:30 pm:  [phone]  Please check AMION for all [place] phone numbers After 10:00 PM, call [place] [phone]  06/26/2020 7:42 AM

## 2020-06-26 NOTE — Discharge Instructions (Signed)
Biliary Colic, Adult  Biliary colic is severe pain caused by a problem with the gallbladder. The gallbladder is a small organ in the upper right part of the abdomen. The gallbladder stores a digestive fluid produced in the liver (bile) that helps the body break down fat. Bile and other digestive enzymes are carried from the liver to the small intestine through tube-like structures called bile ducts. The gallbladder and the bile ducts form the biliary tract. Sometimes, hard deposits of digestive fluids (gallstones) form in the gallbladder and block the flow of bile from the gallbladder, causing biliary colic. This condition is also called a gallbladder attack. Gallstones can be as small as a grain of sand or as big as a golf ball. There could be just one gallstone in the gallbladder, or there could be many. What are the causes? This condition is usually caused by gallstones. Less often, a tumor could block the flow of bile from the gallbladder and trigger biliary colic. What increases the risk? The following factors may make you more likely to develop this condition:  Being male.  Having a family history of gallstones.  Being obese.  Losing weight suddenly or quickly.  Eating a diet that is high in calories, low in fiber, and rich in refined carbohydrates, such as white bread and white rice.  Having certain health conditions, such as: ? An intestinal disease that affects nutrient absorption, such as Crohn's disease. ? A metabolic condition, such as diabetes or metabolic syndrome. Metabolic syndrome occurs when someone has high blood pressure, high cholesterol, and diabetes. ? A blood condition, such as hemolytic anemia or sickle cell disease. What are the signs or symptoms? The main symptom of this condition is severe pain in the upper right side of the abdomen. You may feel this pain below the chest but above the hip. This pain often occurs at night or after eating a meal that is high in  fat. This pain may get worse for up to an hour and last as long as 12 hours. In most cases, the pain fades (subsides) within 2 hours. Other symptoms of this condition include:  Nausea and vomiting.  Pain under the right shoulder. How is this diagnosed? This condition is diagnosed based on your medical history, your symptoms, and a physical exam. You may also have tests, including:  Blood tests to rule out infection or inflammation of the bile ducts, gallbladder, pancreas, or liver.  Imaging studies, such as: ? An ultrasound. ? A CT scan. ? An MRI. In some cases, you may need to have an imaging study done using a small amount of radioactive material (nuclear medicine) to confirm the diagnosis. How is this treated? This condition may be treated with medicines to:  Relieve your pain or nausea.  Dissolve the gallstones. It may take months or years before the gallstones are completely gone. If you have gallstones, or if you have a tumor in the gallbladder that is causing biliary colic, you may need surgery to remove the gallbladder (cholecystectomy). Follow these instructions at home: Eating and drinking  Drink enough fluid to keep your urine pale yellow.  Follow instructions from your health care provider about eating or drinking restrictions. These may include avoiding: ? Fatty, greasy, and fried foods. ? Any foods that make the pain worse. ? Overeating. ? Having a large meal after not eating for a while. General instructions  Take over-the-counter and prescription medicines only as told by your health care provider.  Keep all  weight. Getting regular exercise. Eating a healthy diet that is high in fiber and low in fat. Limiting how much sugar and refined carbohydrates you eat. Contact a health care provider if: Your pain lasts more than 5 hours. You vomit. You  have a fever and chills. Your pain gets worse. Get help right away if: Your skin or the whites of your eyes look yellow (jaundice). Your have tea-colored urine and light-colored stools (feces). You are dizzy or you faint. Summary Biliary colic is severe pain caused by a problem with the gallbladder. The gallbladder is a small organ in the upper right part of your abdomen. Treatment for this condition may include medicine to relieve your pain or nausea, or medicine to slowly dissolve the gallstones. If you have gallstones, or if you have a tumor in the gallbladder that is causing biliary colic, you may need surgery to remove the gallbladder (cholecystectomy). This information is not intended to replace advice given to you by your health care provider. Make sure you discuss any questions you have with your health care provider. Document Revised: 02/10/2019 Document Reviewed: 12/02/2018 [employer] Patient Education  2021 [employer]  

## 2020-06-26 NOTE — Discharge Summary (Signed)
Physician Discharge Summary  Terry Gregory NOB:[id number] DOB: 07/21/1921 DOA: 06/22/2020  PCP: Burnard Bunting, MD  Admit date: 06/22/2020 Discharge date: 06/26/2020 30 Day Unplanned Readmission Risk Score   Flowsheet Row ED to Hosp-Admission (Current) from 06/22/2020 in [facility]  30 Day Unplanned Readmission Risk Score (%) 9.27 Filed at 06/26/2020 0400     This score is the patient's risk of an unplanned readmission within 30 days of being discharged (0 -100%). The score is based on dignosis, age, lab data, medications, orders, and past utilization.   Low:  0-14.9   Medium: 15-21.9   High: 22-29.9   Extreme: 30 and above         Admitted From: Home Disposition: Home  Recommendations for Outpatient Follow-up:  1. Follow up with PCP in 1-2 weeks 2. Please obtain BMP/CBC in one week 3. Please follow up with your PCP on the following pending results: Unresulted Labs (From admission, onward)          Start     Ordered   06/26/20 0500  Comprehensive metabolic panel  Daily,   R      06/25/20 1129            Home Health: None Equipment/Devices: None  Discharge Condition: Stable CODE STATUS: Full code Diet recommendation: Cardiac  Subjective: Seen and examined.  Daughter at the bedside.  He has no complaint.  No abdominal pain.  Tolerating regular diet.  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 In the ED, afebrile. WBC 12.1, AST 1074, ALT 485, alk phos  211, T bili 2.3. Lipase normal. Acute hepatitis panel negative. Acetaminophen level undetectable. Screening COVID and influenza PCR negative. CT abdomen did not reveal cause of acute abdominal pain and elevated LFTs. Right upper quadrant ultrasound showing no acute abnormality. ED physician discussed the case with Dr. Cristina Gong from gastroenterology who recommended admission for antibiotics.  Underwent MRCP on 06/23/2020 which showed filling defect in distal CBD representing multiple stacked stones.  Underwent ERCP 06/24/2020 with removal of large CBD stone.  His liver function has improved significantly.  Has normal alkaline phosphatase and total bilirubin today.  Has no pain.  Tolerating regular diet.  Seen by PT OT who did not think that he needs further PT.  He is being discharged in stable condition.  He was provided with IV antibiotics throughout this hospitalization however no oral antibiotics being prescribed as per GI recommendations.  Discharge Diagnoses:  Principal Problem:   Elevated LFTs Active Problems:   HTN (hypertension)   Near syncope   BPH (benign prostatic hyperplasia)   Common bile duct (CBD) obstruction   Common bile duct stone    Discharge Instructions   Allergies as of 06/26/2020      Reactions   Erythromycin Hives, Rash   Penicillins Hives, Rash   .Marland KitchenHas patient had a PCN reaction causing immediate rash, facial/tongue/throat swelling, SOB or lightheadedness with hypotension: Yes Has patient had a PCN reaction causing severe rash involving mucus membranes or skin necrosis: No Has patient had a PCN reaction that required hospitalization No Has patient had a  PCN reaction occurring within the last 10 years: No If all of the above answers are "NO", then may proceed with Cephalosporin use.      Medication List    TAKE these medications   aspirin EC 81 MG tablet Take 81 mg by mouth daily.   finasteride 5 MG tablet Commonly known as: PROSCAR Take 5 mg by mouth  daily.       Follow-up Information    Burnard Bunting, MD Follow up in 1 week(s).   Specialty: Internal Medicine Contact information: [place] [address] [phone]        Sanda Klein, MD .   Specialty: Cardiology Contact information: [address] [place] [place] [address] [phone]              Allergies  Allergen Reactions  . Erythromycin Hives and Rash  . Penicillins Hives and Rash    .Marland KitchenHas patient had a PCN reaction causing immediate rash, facial/tongue/throat swelling, SOB or lightheadedness with hypotension: Yes Has patient had a PCN reaction causing severe rash involving mucus membranes or skin necrosis: No Has patient had a PCN reaction that required hospitalization No Has patient had a PCN reaction occurring within the last 10 years: No If all of the above answers are "NO", then may proceed with Cephalosporin use.     Consultations: GI   Procedures/Studies: CT ABDOMEN PELVIS WO CONTRAST  Result Date: 06/22/2020 CLINICAL DATA:  87 year old with acute abdominal pain. Weakness. Fall in the bathroom this morning. EXAM: CT ABDOMEN AND PELVIS WITHOUT CONTRAST TECHNIQUE: Multidetector CT imaging of the abdomen and pelvis was performed following the standard protocol without IV contrast. COMPARISON:  CT 06/23/2014 FINDINGS: Mild motion artifact limitations. Lower chest: No acute airspace disease or pleural effusion. Pacemaker wires partially included. Small hiatal hernia with mild distal esophageal wall thickening. Hepatobiliary: No focal hepatic abnormality on noncontrast exam. Cholecystectomy without biliary dilatation. Pancreas: No ductal dilatation or inflammation. Spleen: Normal in size without focal abnormality. Adrenals/Urinary Tract: No adrenal nodule or hemorrhage. No hydronephrosis or renal calculi. Bilateral renal parenchymal thinning and perinephric edema, chronic. Previous right renal collecting system duplication was  better assessed on prior contrast-enhanced exam. No evidence of focal renal lesion. Urinary bladder is partially distended, streak artifact from right hip arthroplasty partially obscures detailed assessment. Stomach/Bowel: Small hiatal hernia with mild wall thickening of the distal esophagus. Stomach is partially distended, grossly unremarkable. There is a duodenal diverticulum. Scattered fecalization of small bowel contents without abnormal distension or obstruction. Appendix not definitively visualized, no appendicitis. Diffuse colonic diverticulosis. No evidence of diverticulitis. Moderate stool burden with colonic redundancy. Rectum is distended with stool, 8.4 cm. No definite associated wall thickening. There is no colonic wall thickening or pericolonic edema. Vascular/Lymphatic: Aortic atherosclerosis. No aortic aneurysm. 13 mm left periaortic node, series 2, image 33. 10 mm anterior periaortic node, series 2, image 38. Multiple additional small retroperitoneal nodes are not enlarged by size criteria. Few scattered mesenteric nodes. There is mild generalized edema of the retroperitoneal fat. This does not appear centered on the aorta. No definite pelvic adenopathy, evaluation of the right pelvis partially obscured by streak artifact from right hip arthroplasty. Reproductive: Enlarged prostate gland spanning 7.2 x 5.9 x 5.2 cm (volume = 120 cm^3). Other: No free air or free fluid. Laxity of the anterior abdominal wall musculature without frank hernia. There is fat in the left greater than right inguinal canal. Musculoskeletal: Right hip arthroplasty. No acute fracture of the included spine, ribs, or  pelvis. No focal bone lesion. No focal body wall soft tissue abnormality. IMPRESSION: 1. Mild generalized edema of the retroperitoneal fat with scattered mildly enlarged retroperitoneal nodes. This is of uncertain significance. Possibility of retroperitoneal fibrosis is raised. Inflammatory changes do not appear  centered on the aorta are vascular structures, and vasculitis or acute vascular process is felt unlikely. 2. Moderate stool burden with colonic redundancy and fecalization of small bowel contents, consistent with slow transit. No bowel obstruction or inflammation. 3. Colonic diverticulosis without diverticulitis. 4. Enlarged prostate gland. 5. Small hiatal hernia with mild distal esophageal wall thickening, can be seen with reflux or esophagitis. Aortic Atherosclerosis (ICD10-I70.0). Electronically Signed   By: Keith Rake M.D.   On: 06/22/2020 20:18   MR 3D Recon At Scanner  Result Date: 06/24/2020 CLINICAL DATA:  Right upper quadrant abdominal pain. Status post cholecystectomy. EXAM: MRI ABDOMEN WITHOUT AND WITH CONTRAST (INCLUDING MRCP) TECHNIQUE: Multiplanar multisequence MR imaging of the abdomen was performed both before and after the administration of intravenous contrast. Heavily T2-weighted images of the biliary and pancreatic ducts were obtained, and three-dimensional MRCP images were rendered by post processing. CONTRAST:  14m GADAVIST GADOBUTROL 1 MMOL/ML IV SOLN COMPARISON:  CT AP 11 scratch set CT AP 06/22/2020 and right upper quadrant ultrasound 06/22/2020 FINDINGS: Lower chest: New small bilateral pleural effusions. Hepatobiliary: Exam detail diminished due to motion artifact. No focal liver abnormality identified. Status post cholecystectomy. Mild intrahepatic bile duct dilatation. The common bile duct is increased in caliber measuring 8 mm, image 10/16. Filling defect within the distal CBD is identified measuring 1 cm in length and 4 mm in diameter. This may represent 1 elongated stone or multiple stacked stones. Pancreas: No main duct dilatation. No focal fluid collection or mass identified. The equivocal edema. Spleen:  Within normal limits in size and appearance. Adrenals/Urinary Tract: Normal adrenal glands. No kidney mass or hydronephrosis. Stomach/Bowel: Visualized portions within  the abdomen are unremarkable. Vascular/Lymphatic: No pathologically enlarged lymph nodes identified. No abdominal aortic aneurysm demonstrated. Other: No free fluid or fluid collections. Trace retroperitoneal fluid/edema, nonspecific. Musculoskeletal: No suspicious bone lesions identified. IMPRESSION: 1. Exam detail diminished due to motion artifact. 2. Filling defect within the distal CBD may represent 1 cm elongated stone or multiple stacked stones. 3. Mild intrahepatic and common bile duct dilatation. 4. Equivocal edema within the pancreas. Correlate for any clinical signs of pancreatitis. 5. New small bilateral pleural effusions. Mild edema within the mesenteric fat and retroperitoneal fat. Correlate for any clinical signs or symptoms of CHF/fluid overload. 6. These results will be called to the ordering clinician or representative by the Radiologist Assistant, and communication documented in the PACS or [employer] Electronically Signed   By: TKerby MoorsM.D.   On: 06/24/2020 07:51   DG ERCP  Result Date: 06/24/2020 CLINICAL DATA:  87year old male with choledocholithiasis EXAM: ERCP TECHNIQUE: Multiple spot images obtained with the fluoroscopic device and submitted for interpretation post-procedure. FLUOROSCOPY TIME:  Fluoroscopy Time:  4 minutes 32 seconds Radiation Exposure Index (if provided by the fluoroscopic device): 65.2 mGy Number of Acquired Spot Images: 0 COMPARISON:  MRCP 06/23/2020 FINDINGS: A total of 8 intraoperative saved images are submitted for review. The images demonstrate a flexible duodenal scope in the descending duodenum with wire cannulation of the left intrahepatic ducts. Subsequent cholangiogram demonstrates mild biliary ductal dilatation and surgical changes of cholecystectomy. Filling defect present within the distal common bile duct consistent with choledocholithiasis. Subsequent images document sphincterotomy and balloon sweeping of the common  duct. IMPRESSION: 1.  Choledocholithiasis. 2. ERCP with sphincterotomy and balloon sweep of the common duct. These images were submitted for radiologic interpretation only. Please see the procedural report for the amount of contrast and the fluoroscopy time utilized. Electronically Signed   By: Jacqulynn Cadet M.D.   On: 06/24/2020 12:35   MR ABDOMEN MRCP W WO CONTAST  Result Date: 06/24/2020 CLINICAL DATA:  Right upper quadrant abdominal pain. Status post cholecystectomy. EXAM: MRI ABDOMEN WITHOUT AND WITH CONTRAST (INCLUDING MRCP) TECHNIQUE: Multiplanar multisequence MR imaging of the abdomen was performed both before and after the administration of intravenous contrast. Heavily T2-weighted images of the biliary and pancreatic ducts were obtained, and three-dimensional MRCP images were rendered by post processing. CONTRAST:  2m GADAVIST GADOBUTROL 1 MMOL/ML IV SOLN COMPARISON:  CT AP 11 scratch set CT AP 06/22/2020 and right upper quadrant ultrasound 06/22/2020 FINDINGS: Lower chest: New small bilateral pleural effusions. Hepatobiliary: Exam detail diminished due to motion artifact. No focal liver abnormality identified. Status post cholecystectomy. Mild intrahepatic bile duct dilatation. The common bile duct is increased in caliber measuring 8 mm, image 10/16. Filling defect within the distal CBD is identified measuring 1 cm in length and 4 mm in diameter. This may represent 1 elongated stone or multiple stacked stones. Pancreas: No main duct dilatation. No focal fluid collection or mass identified. The equivocal edema. Spleen:  Within normal limits in size and appearance. Adrenals/Urinary Tract: Normal adrenal glands. No kidney mass or hydronephrosis. Stomach/Bowel: Visualized portions within the abdomen are unremarkable. Vascular/Lymphatic: No pathologically enlarged lymph nodes identified. No abdominal aortic aneurysm demonstrated. Other: No free fluid or fluid collections. Trace retroperitoneal fluid/edema, nonspecific.  Musculoskeletal: No suspicious bone lesions identified. IMPRESSION: 1. Exam detail diminished due to motion artifact. 2. Filling defect within the distal CBD may represent 1 cm elongated stone or multiple stacked stones. 3. Mild intrahepatic and common bile duct dilatation. 4. Equivocal edema within the pancreas. Correlate for any clinical signs of pancreatitis. 5. New small bilateral pleural effusions. Mild edema within the mesenteric fat and retroperitoneal fat. Correlate for any clinical signs or symptoms of CHF/fluid overload. 6. These results will be called to the ordering clinician or representative by the Radiologist Assistant, and communication documented in the PACS or [employer] Electronically Signed   By: TKerby MoorsM.D.   On: 06/24/2020 07:51   CUP PACEART REMOTE DEVICE CHECK  Result Date: 06/21/2020 Scheduled remote reviewed. Normal device function.  Next remote 91 days- JBox, RN/CVRS  UKoreaAbdomen Limited RUQ (LIVER/GB)  Result Date: 06/22/2020 CLINICAL DATA:  Elevated LFTs EXAM: ULTRASOUND ABDOMEN LIMITED RIGHT UPPER QUADRANT COMPARISON:  CT from earlier in the same day. FINDINGS: Gallbladder: Surgically removed Common bile duct: Diameter: 3.1 mm. Liver: No focal lesion identified. Within normal limits in parenchymal echogenicity. Portal vein is patent on color Doppler imaging with normal direction of blood flow towards the liver. Other: None. IMPRESSION: Status post cholecystectomy.  No other focal abnormality is noted. Electronically Signed   By: MInez CatalinaM.D.   On: 06/22/2020 21:33      Discharge Exam: Vitals:   06/25/20 1954 06/26/20 0728  BP: (!) 149/94 (!) 149/96  Pulse: 76 60  Resp: 18 17  Temp: 97.7 F (36.5 C) (!) 97.4 F (36.3 C)  SpO2: 96% 97%   Vitals:   06/25/20 0806 06/25/20 1505 06/25/20 1954 06/26/20 0728  BP: (!) 149/78 (!) 154/75 (!) 149/94 (!) 149/96  Pulse: 67 75 76 60  Resp: '17 17 18 ' 17  Temp: 98.3 F (36.8 C) 98.1 F (36.7 C) 97.7 F  (36.5 C) (!) 97.4 F (36.3 C)  TempSrc: Oral Oral Oral Oral  SpO2: 94% 97% 96% 97%  Weight:      Height:        General: Pt is alert, awake, not in acute distress Cardiovascular: RRR, S1/S2 +, no rubs, no gallops Respiratory: CTA bilaterally, no wheezing, no rhonchi Abdominal: Soft, NT, ND, bowel sounds + Extremities: no edema, no cyanosis    The results of significant diagnostics from this hospitalization (including imaging, microbiology, ancillary and laboratory) are listed below for reference.     Microbiology: Recent Results (from the past 240 hour(s))  Resp Panel by RT-PCR (Flu A&B, Covid) Nasopharyngeal Swab     Status: None   Collection Time: 06/22/20 10:40 PM   Specimen: Nasopharyngeal Swab; Nasopharyngeal(NP) swabs in vial transport medium  Result Value Ref Range Status   SARS Coronavirus 2 by RT PCR NEGATIVE NEGATIVE Final    Comment: (NOTE) SARS-CoV-2 target nucleic acids are NOT DETECTED.  The SARS-CoV-2 RNA is generally detectable in upper respiratory specimens during the acute phase of infection. The lowest concentration of SARS-CoV-2 viral copies this assay can detect is 138 copies/mL. A negative result does not preclude SARS-Cov-2 infection and should not be used as the sole basis for treatment or other patient management decisions. A negative result may occur with  improper specimen collection/handling, submission of specimen other than nasopharyngeal swab, presence of viral mutation(s) within the areas targeted by this assay, and inadequate number of viral copies(<138 copies/mL). A negative result must be combined with clinical observations, patient history, and epidemiological information. The expected result is Negative.  Fact Sheet for Patients:  EntrepreneurPulse.com.au  Fact Sheet for Healthcare Providers:  IncredibleEmployment.be  This test is no t yet approved or cleared by the [place] FDA and  has  been authorized for detection and/or diagnosis of SARS-CoV-2 by FDA under an Emergency Use Authorization (EUA). This EUA will remain  in effect (meaning this test can be used) for the duration of the COVID-19 declaration under Section 564(b)(1) of the Act, 21 U.S.C.section 360bbb-3(b)(1), unless the authorization is terminated  or revoked sooner.       Influenza A by PCR NEGATIVE NEGATIVE Final   Influenza B by PCR NEGATIVE NEGATIVE Final    Comment: (NOTE) The Xpert Xpress SARS-CoV-2/FLU/RSV plus assay is intended as an aid in the diagnosis of influenza from Nasopharyngeal swab specimens and should not be used as a sole basis for treatment. Nasal washings and aspirates are unacceptable for Xpert Xpress SARS-CoV-2/FLU/RSV testing.  Fact Sheet for Patients: EntrepreneurPulse.com.au  Fact Sheet for Healthcare Providers: IncredibleEmployment.be  This test is not yet approved or cleared by the [place] FDA and has been authorized for detection and/or diagnosis of SARS-CoV-2 by FDA under an Emergency Use Authorization (EUA). This EUA will remain in effect (meaning this test can be used) for the duration of the COVID-19 declaration under Section 564(b)(1) of the Act, 21 U.S.C. section 360bbb-3(b)(1), unless the authorization is terminated or revoked.  Performed at [facility], [address] [place], [place] [address]      Labs: BNP (last 3 results) No results for input(s): BNP in the last 8760 hours. Basic Metabolic Panel: Recent Labs  Lab 06/22/20 1849 06/23/20 0611 06/24/20 0335 06/25/20 0339 06/26/20 0233  NA 134* 136 136 136 138  K 3.8 4.2 3.6 4.4 3.9  CL 101 105 104 105 104  CO2 '23 22 23 24 27  ' GLUCOSE 147* 115* 86 112* 108*  BUN 29* 29* 26* 20 14  CREATININE 0.99 1.20 1.09 0.98 0.89  CALCIUM 8.6* 8.4* 8.5* 8.3* 8.7*  MG  --  1.9  --   --  1.8   Liver Function Tests: Recent Labs  Lab 06/22/20 1849  06/23/20 0611 06/24/20 0335 06/25/20 0339 06/26/20 0233  AST 1,074* 713* 327* 160* 83*  ALT 485* 514* 342* 228* 157*  ALKPHOS 211* 150* 139* 125 121  BILITOT 2.3* 3.1* 2.3* 1.3* 1.0  PROT 6.9 5.6* 5.5* 5.5* 5.4*  ALBUMIN 3.6 3.0* 2.8* 2.8* 2.6*   Recent Labs  Lab 06/22/20 1849  LIPASE 24   No results for input(s): AMMONIA in the last 168 hours. CBC: Recent Labs  Lab 06/22/20 1849 06/23/20 0611 06/24/20 0832 06/25/20 0339  WBC 12.1* 19.6* 13.2* 8.1  NEUTROABS 11.7*  --  11.2* 6.4  HGB 12.4* 10.8* 10.7* 11.2*  HCT 36.3* 32.5* 33.4* 33.9*  MCV 100.3* 100.9* 101.8* 101.5*  PLT 155 130* 125* 122*   Cardiac Enzymes: No results for input(s): CKTOTAL, CKMB, CKMBINDEX, TROPONINI in the last 168 hours. BNP: Invalid input(s): POCBNP CBG: No results for input(s): GLUCAP in the last 168 hours. D-Dimer No results for input(s): DDIMER in the last 72 hours. Hgb A1c No results for input(s): HGBA1C in the last 72 hours. Lipid Profile No results for input(s): CHOL, HDL, LDLCALC, TRIG, CHOLHDL, LDLDIRECT in the last 72 hours. Thyroid function studies No results for input(s): TSH, T4TOTAL, T3FREE, THYROIDAB in the last 72 hours.  Invalid input(s): FREET3 Anemia work up No results for input(s): VITAMINB12, FOLATE, FERRITIN, TIBC, IRON, RETICCTPCT in the last 72 hours. Urinalysis    Component Value Date/Time   COLORURINE YELLOW 06/22/2020 1952   APPEARANCEUR CLEAR 06/22/2020 1952   LABSPEC 1.015 06/22/2020 1952   PHURINE 6.0 06/22/2020 1952   GLUCOSEU NEGATIVE 06/22/2020 1952   HGBUR SMALL (A) 06/22/2020 1952   BILIRUBINUR SMALL (A) 06/22/2020 1952   KETONESUR NEGATIVE 06/22/2020 1952   PROTEINUR NEGATIVE 06/22/2020 1952   UROBILINOGEN 0.2 10/27/2007 1029   NITRITE NEGATIVE 06/22/2020 1952   LEUKOCYTESUR NEGATIVE 06/22/2020 1952   Sepsis Labs Invalid input(s): PROCALCITONIN,  WBC,  LACTICIDVEN Microbiology Recent Results (from the past 240 hour(s))  Resp Panel by RT-PCR  (Flu A&B, Covid) Nasopharyngeal Swab     Status: None   Collection Time: 06/22/20 10:40 PM   Specimen: Nasopharyngeal Swab; Nasopharyngeal(NP) swabs in vial transport medium  Result Value Ref Range Status   SARS Coronavirus 2 by RT PCR NEGATIVE NEGATIVE Final    Comment: (NOTE) SARS-CoV-2 target nucleic acids are NOT DETECTED.  The SARS-CoV-2 RNA is generally detectable in upper respiratory specimens during the acute phase of infection. The lowest concentration of SARS-CoV-2 viral copies this assay can detect is 138 copies/mL. A negative result does not preclude SARS-Cov-2 infection and should not be used as the sole basis for treatment or other patient management decisions. A negative result may occur with  improper specimen collection/handling, submission of specimen other than nasopharyngeal swab, presence of viral mutation(s) within the areas targeted by this assay, and inadequate number of viral copies(<138 copies/mL). A negative result must be combined with clinical observations, patient history, and epidemiological information. The expected result is Negative.  Fact Sheet for Patients:  EntrepreneurPulse.com.au  Fact Sheet for Healthcare Providers:  IncredibleEmployment.be  This test is no t yet approved or cleared by the [place] FDA and  has been  authorized for detection and/or diagnosis of SARS-CoV-2 by FDA under an Emergency Use Authorization (EUA). This EUA will remain  in effect (meaning this test can be used) for the duration of the COVID-19 declaration under Section 564(b)(1) of the Act, 21 U.S.C.section 360bbb-3(b)(1), unless the authorization is terminated  or revoked sooner.       Influenza A by PCR NEGATIVE NEGATIVE Final   Influenza B by PCR NEGATIVE NEGATIVE Final    Comment: (NOTE) The Xpert Xpress SARS-CoV-2/FLU/RSV plus assay is intended as an aid in the diagnosis of influenza from Nasopharyngeal swab specimens  and should not be used as a sole basis for treatment. Nasal washings and aspirates are unacceptable for Xpert Xpress SARS-CoV-2/FLU/RSV testing.  Fact Sheet for Patients: EntrepreneurPulse.com.au  Fact Sheet for Healthcare Providers: IncredibleEmployment.be  This test is not yet approved or cleared by the [place] FDA and has been authorized for detection and/or diagnosis of SARS-CoV-2 by FDA under an Emergency Use Authorization (EUA). This EUA will remain in effect (meaning this test can be used) for the duration of the COVID-19 declaration under Section 564(b)(1) of the Act, 21 U.S.C. section 360bbb-3(b)(1), unless the authorization is terminated or revoked.  Performed at [facility], [address] [place], [place] [address]      Time coordinating discharge: Over 30 minutes  SIGNED:   Darliss Cheney, MD  [facility] 06/26/2020, 7:52 AM  If 7PM-7AM, please contact night-coverage [email or web address]

## 2020-07-13 NOTE — Progress Notes (Signed)
Remote pacemaker transmission.   

## 2020-09-20 ENCOUNTER — Ambulatory Visit (INDEPENDENT_AMBULATORY_CARE_PROVIDER_SITE_OTHER): Payer: Medicare Other

## 2020-09-20 DIAGNOSIS — I441 Atrioventricular block, second degree: Secondary | ICD-10-CM | POA: Diagnosis not present

## 2020-09-20 LAB — CUP PACEART REMOTE DEVICE CHECK
Battery Remaining Longevity: 129 mo
Battery Voltage: 3.12 V
Brady Statistic AP VP Percent: 53.04 %
Brady Statistic AP VS Percent: 0.19 %
Brady Statistic AS VP Percent: 44.71 %
Brady Statistic AS VS Percent: 2.05 %
Brady Statistic RA Percent Paced: 53.73 %
Brady Statistic RV Percent Paced: 97.75 %
Date Time Interrogation Session: 20220808232223
Implantable Lead Implant Date: 20220207
Implantable Lead Implant Date: 20220207
Implantable Lead Location: 753859
Implantable Lead Location: 753860
Implantable Lead Model: 5076
Implantable Lead Model: 5076
Implantable Pulse Generator Implant Date: 20220207
Lead Channel Impedance Value: 342 Ohm
Lead Channel Impedance Value: 361 Ohm
Lead Channel Impedance Value: 437 Ohm
Lead Channel Impedance Value: 513 Ohm
Lead Channel Pacing Threshold Amplitude: 0.75 V
Lead Channel Pacing Threshold Amplitude: 0.875 V
Lead Channel Pacing Threshold Pulse Width: 0.4 ms
Lead Channel Pacing Threshold Pulse Width: 0.4 ms
Lead Channel Sensing Intrinsic Amplitude: 13.125 mV
Lead Channel Sensing Intrinsic Amplitude: 13.125 mV
Lead Channel Sensing Intrinsic Amplitude: 4.125 mV
Lead Channel Sensing Intrinsic Amplitude: 4.125 mV
Lead Channel Setting Pacing Amplitude: 2 V
Lead Channel Setting Pacing Amplitude: 2.5 V
Lead Channel Setting Pacing Pulse Width: 0.4 ms
Lead Channel Setting Sensing Sensitivity: 1.2 mV

## 2020-10-13 NOTE — Progress Notes (Signed)
Remote pacemaker transmission.   

## 2020-12-20 ENCOUNTER — Ambulatory Visit (INDEPENDENT_AMBULATORY_CARE_PROVIDER_SITE_OTHER): Payer: Medicare Other

## 2020-12-20 DIAGNOSIS — I441 Atrioventricular block, second degree: Secondary | ICD-10-CM | POA: Diagnosis not present

## 2020-12-20 LAB — CUP PACEART REMOTE DEVICE CHECK
Battery Remaining Longevity: 125 mo
Battery Voltage: 3.05 V
Brady Statistic AP VP Percent: 46.28 %
Brady Statistic AP VS Percent: 0.01 %
Brady Statistic AS VP Percent: 52.97 %
Brady Statistic AS VS Percent: 0.74 %
Brady Statistic RA Percent Paced: 46.37 %
Brady Statistic RV Percent Paced: 99.24 %
Date Time Interrogation Session: 20221108025330
Implantable Lead Implant Date: 20220207
Implantable Lead Implant Date: 20220207
Implantable Lead Location: 753859
Implantable Lead Location: 753860
Implantable Lead Model: 5076
Implantable Lead Model: 5076
Implantable Pulse Generator Implant Date: 20220207
Lead Channel Impedance Value: 342 Ohm
Lead Channel Impedance Value: 342 Ohm
Lead Channel Impedance Value: 437 Ohm
Lead Channel Impedance Value: 494 Ohm
Lead Channel Pacing Threshold Amplitude: 0.75 V
Lead Channel Pacing Threshold Amplitude: 0.75 V
Lead Channel Pacing Threshold Pulse Width: 0.4 ms
Lead Channel Pacing Threshold Pulse Width: 0.4 ms
Lead Channel Sensing Intrinsic Amplitude: 10.875 mV
Lead Channel Sensing Intrinsic Amplitude: 10.875 mV
Lead Channel Sensing Intrinsic Amplitude: 3.875 mV
Lead Channel Sensing Intrinsic Amplitude: 3.875 mV
Lead Channel Setting Pacing Amplitude: 2 V
Lead Channel Setting Pacing Amplitude: 2.5 V
Lead Channel Setting Pacing Pulse Width: 0.4 ms
Lead Channel Setting Sensing Sensitivity: 1.2 mV

## 2020-12-28 NOTE — Progress Notes (Signed)
Remote pacemaker transmission.   

## 2021-03-21 ENCOUNTER — Ambulatory Visit (INDEPENDENT_AMBULATORY_CARE_PROVIDER_SITE_OTHER): Payer: Medicare Other

## 2021-03-21 DIAGNOSIS — I441 Atrioventricular block, second degree: Secondary | ICD-10-CM | POA: Diagnosis not present

## 2021-03-21 LAB — CUP PACEART REMOTE DEVICE CHECK
Battery Remaining Longevity: 122 mo
Battery Voltage: 3.02 V
Brady Statistic AP VP Percent: 48.46 %
Brady Statistic AP VS Percent: 0 %
Brady Statistic AS VP Percent: 51.29 %
Brady Statistic AS VS Percent: 0.24 %
Brady Statistic RA Percent Paced: 48.24 %
Brady Statistic RV Percent Paced: 99.75 %
Date Time Interrogation Session: 20230206202108
Implantable Lead Implant Date: 20220207
Implantable Lead Implant Date: 20220207
Implantable Lead Location: 753859
Implantable Lead Location: 753860
Implantable Lead Model: 5076
Implantable Lead Model: 5076
Implantable Pulse Generator Implant Date: 20220207
Lead Channel Impedance Value: 342 Ohm
Lead Channel Impedance Value: 361 Ohm
Lead Channel Impedance Value: 437 Ohm
Lead Channel Impedance Value: 456 Ohm
Lead Channel Pacing Threshold Amplitude: 0.625 V
Lead Channel Pacing Threshold Amplitude: 0.75 V
Lead Channel Pacing Threshold Pulse Width: 0.4 ms
Lead Channel Pacing Threshold Pulse Width: 0.4 ms
Lead Channel Sensing Intrinsic Amplitude: 10.875 mV
Lead Channel Sensing Intrinsic Amplitude: 10.875 mV
Lead Channel Sensing Intrinsic Amplitude: 4 mV
Lead Channel Sensing Intrinsic Amplitude: 4 mV
Lead Channel Setting Pacing Amplitude: 2 V
Lead Channel Setting Pacing Amplitude: 2.5 V
Lead Channel Setting Pacing Pulse Width: 0.4 ms
Lead Channel Setting Sensing Sensitivity: 1.2 mV

## 2021-03-24 NOTE — Progress Notes (Signed)
Remote pacemaker transmission.   

## 2021-04-20 ENCOUNTER — Telehealth: Payer: Self-pay

## 2021-04-20 NOTE — Telephone Encounter (Signed)
MDT alert for AT/AF in progress, controlled ventricular rates ?Burden 0.8%, no OAC ?Remote hx of flutter ?Route to triage ?LA ? ?Unsuccessful telephone encounter to patient to follow up on s/s of current episode of AFL. Of note current date and time is 04/20/21 at 2:21pm. Event is logged as 04/20/21 at 1628 which has not occurred yet. Spoke with industry who feels strongly device clock is 12 hours off which would put onset of event 0428 this morning. If correct patient has been in AFL for approx 10 hours current time. [employer] compliant VM message left requesting call back to [phone]. ? ? ? ? ? ? ? ? ? ?

## 2021-04-20 NOTE — Telephone Encounter (Signed)
He had one other episode of atrial flutter in 2017 and this is the first event in one year since device implantation. Questionable risk-benefit ratio for anticoagulants at age 88 . Please make him a non-urgent office appointment to discuss the arrhythmia. ?

## 2021-04-20 NOTE — Telephone Encounter (Signed)
Patient/Daughter returning call. Advised of AFL (new onset??). Assisted daughter and patient with sending manual transmission to confirm. Patient remains in AF/AFL Remote history per Dr. Victorino December last [place] note. No OAC. Patient denying symptoms of dizziness, fatigue, shortness of breath, chest pain/pressure. V rates controlled with pacing. Will route to Dr. Sallyanne Kuster for advisement.  ? ? ? ? ? ? ? ?

## 2021-04-21 NOTE — Telephone Encounter (Signed)
Appointment scheduled for 4/10 with Dr. Sallyanne Kuster ?

## 2021-05-22 ENCOUNTER — Ambulatory Visit (INDEPENDENT_AMBULATORY_CARE_PROVIDER_SITE_OTHER): Payer: Medicare Other | Admitting: Cardiovascular Disease

## 2021-05-22 ENCOUNTER — Encounter: Payer: Self-pay | Admitting: Cardiovascular Disease

## 2021-05-22 VITALS — BP 126/74 | HR 74 | Ht 75.0 in | Wt 208.0 lb

## 2021-05-22 DIAGNOSIS — Z950 Presence of cardiac pacemaker: Secondary | ICD-10-CM

## 2021-05-22 DIAGNOSIS — I493 Ventricular premature depolarization: Secondary | ICD-10-CM

## 2021-05-22 DIAGNOSIS — I483 Typical atrial flutter: Secondary | ICD-10-CM | POA: Diagnosis not present

## 2021-05-22 DIAGNOSIS — I441 Atrioventricular block, second degree: Secondary | ICD-10-CM | POA: Diagnosis not present

## 2021-05-22 DIAGNOSIS — I3139 Other pericardial effusion (noninflammatory): Secondary | ICD-10-CM

## 2021-05-22 DIAGNOSIS — I10 Essential (primary) hypertension: Secondary | ICD-10-CM

## 2021-05-22 NOTE — Progress Notes (Signed)
? ?Cardiology Office Note   ? ?Date:  05/22/2021  ? ?ID:  Terry Gregory, DOB [birth year], MRN [id number] ? ?PCP:  Terry Bunting, MD  ?Cardiologist:   Terry Klein, MD  ? ?Chief complaint: atrial flutter ? ?History of Present Illness:  ?Terry Gregory is a 88 y.o. 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   ? ?As always he is accompanied to the clinic by his daughter. ? ?He had a single episode of atrial flutter with 4: 1 AV conduction in 2017, without clinical or event monitor evidence for recurrence.  For the first time since pacemaker implantation more than a year ago his device has recorded sustained atrial tachyarrhythmia, specifically a 16-hour episode of atrial flutter that occurred on 04/20/2021. ? ?Presenting rhythm today is AV sequential pacing.  He has not had any recurrence of atrial flutter or other meaningful atrial tachyarrhythmia in the last month.  He was never aware of the atrial flutter. ? ?Otherwise device function is normal.  Estimated generator longevity is over 10 years.  He has 50% atrial pacing and 99% ventricular pacing.  Heart rate histogram distribution is appropriate.  Lead parameters are excellent. ? ?His biggest concern remains a rather unsteady gait, although he has not had any falls.  He denies shortness of breath or angina.  He has not had any focal neurological events that would suggest stroke or TIA.  He does not have edema, orthopnea or PND.  He denies any palpitations. ? ?He had an isolated event of documented atrial arrhythmia, atrial flutter with 4: 1 AV conduction in June 2017, recurrence not seen on a subsequent event monitor.  Atrial flutter was again recorded in March 2023, 16-hour episode recorded by his pacemaker (first time in 14 months of monitoring).  In view of his  advanced age, low prevalence of the arrhythmia, absence of history of neurological events, rather unsteady gait and personal preference, anticoagulants were not prescribed.   ? ?Long-standing AV conduction abnormalities with Mobitz type I second-degree AV block, otherwise with very long first-degree AV block, right bundle branch block.  For a long time this was asymptomatic, but he started developing near syncope in late 2021/early 2022 and a dual-chamber Medtronic pacemaker was implanted on 03/21/2020 ? 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  ? ?Past Medical History:  ?Diagnosis Date  ? BPH (benign prostatic hyperplasia)   ? Diverticulosis   ? Pericardial effusion   ? ? ?Past Surgical History:  ?Procedure Laterality Date  ? APPENDECTOMY  1960  ? CATARACT EXTRACTION  2009  ? both eyes  ? CHOLECYSTECTOMY  2000  ? ERCP N/A 06/24/2020  ? Procedure: ENDOSCOPIC RETROGRADE CHOLANGIOPANCREATOGRAPHY (ERCP);  Surgeon: Terry Juniper, MD;  Location: [facility];  Service: Gastroenterology;  Laterality: N/A;  ? INGUINAL HERNIA REPAIR  1980's  ? PACEMAKER IMPLANT N/A 03/21/2020  ? Procedure: PACEMAKER IMPLANT;  Surgeon: Terry Klein, MD;  Location: [facility];  Service: Cardiovascular;  Laterality: N/A;  ? REMOVAL OF STONES  06/24/2020  ? Procedure: REMOVAL OF STONES;  Surgeon: Terry Juniper, MD;  Location: [facility];  Service: Gastroenterology;;  ? SPHINCTEROTOMY  06/24/2020  ? Procedure: SPHINCTEROTOMY;  Surgeon: Terry Juniper, MD;  Location: [facility];  Service: Gastroenterology;;  ? STONE EXTRACTION WITH BASKET  06/24/2020  ? Procedure: STONE EXTRACTION WITH BASKET;  Surgeon: Terry Juniper, MD;  Location: [facility];  Service: Gastroenterology;;  Basket crushed stones  ? TOTAL HIP ARTHROPLASTY  2003  ? right  ? WRIST SURGERY  2009  ? left  ? ? ?Current Medications: ?Outpatient Medications Prior to Visit  ?Medication Sig Dispense Refill  ? aspirin EC 81 MG tablet  Take 81 mg by mouth daily.    ? finasteride (PROSCAR) 5 MG tablet Take 5 mg by mouth daily.    ? ?Facility-Administered Medications Prior to Visit  ?Medication Dose Route Frequency Provider Last Rate Last Admin  ? sodium chloride flush (NS) 0.9 % injection 3 mL  3 mL Intravenous Q12H Terry Taite, MD      ?  ? ?Allergies:   Erythromycin and Penicillins  ? ?Social History  ? ?Socioeconomic History  ? Marital status: Widowed  ?  Spouse name: Not on file  ? Number of children: Not on file  ? Years of education: Not on file  ? Highest education level: Not on file  ?Occupational History  ? Not on file  ?Tobacco Use  ? Smoking status: Never  ? Smokeless tobacco: Never  ?Substance and Sexual Activity  ? Alcohol use: No  ? Drug use: No  ? Sexual activity: Not on file  ?Other Topics Concern  ? Not on file  ?Social History Narrative  ? Not on file  ? ?Social Determinants of Health  ? ?Financial Resource Strain: Not on file  ?Food Insecurity: Not on file  ?Transportation Needs: Not on file  ?Physical Activity: Not on file  ?Stress: Not on file  ?Social Connections: Not on file  ?  ? ?Family History:  The patient's family history includes Cancer in his sister; Diabetes in his sister; Heart attack in his father; Heart failure in his brother; Stroke in his brother and mother.  ? ?ROS:   ?Please see the history of present illness.    ?All other systems are reviewed and are negative.  ? ?PHYSICAL EXAM:   ?VS:  BP 126/74   Pulse 74   Ht '6\' 3"'$  (1.905 m)   Wt 208 lb (94.3 kg)   SpO2 97%   BMI 26.00 kg/m?    ? ?General: Alert, oriented x3, no distress, healthy left subclavian pacemaker site ?Head: no evidence of trauma, PERRL, EOMI, no exophtalmos or lid lag, no myxedema, no xanthelasma; normal ears, nose and oropharynx ?Neck: normal jugular venous pulsations and no hepatojugular reflux; brisk carotid pulses without delay and no carotid bruits ?Chest: clear to auscultation, no signs of consolidation by percussion or palpation,  normal fremitus, symmetrical and full respiratory excursions ?Cardiovascular: normal position and quality of the apical impulse, regular rhythm, normal first and paradoxically split second heart sounds, no murmurs, rubs or gallops ?Abdomen: no tenderness or distention, no masses by palpation, no abnormal pulsatility or arterial bruits, normal bowel sounds, no hepatosplenomegaly ?Extremities: no clubbing, cyanosis or edema; 2+ radial, ulnar and brachial pulses bilaterally; 2+ right femoral, posterior tibial and dorsalis pedis pulses; 2+ left femoral, posterior tibial and dorsalis pedis pulses; no subclavian or femoral bruits ?Neurological: grossly nonfocal ?Psych: Normal mood and affect ? ? ? ?Wt Readings from Last 3 Encounters:  ?05/22/21 208 lb (94.3 kg)  ?06/24/20 199 lb 15.3 oz (90.7 kg)  ?06/20/20 210 lb 6.4 oz (95.4 kg)  ?  ? ? ?Studies/Labs Reviewed:  ? ?EKG:  EKG is ordered and personally reviewed.  It shows atrial ventricular sequential pacing the QRS is broad at 192 ms, QTc  486 ms. ? ?ASSESSMENT:   ? ?1. Typical atrial flutter ([place])   ?2. Pacemaker   ?3. Second degree AV block, Mobitz type I   ?4. PVCs (premature ventricular contractions)   ?5. Essential hypertension   ?6. Idiopathic pericardial effusion   ? ? ? ?PLAN:  ?In order of problems listed above: ? ?Atrial flutter: This is the second documented episode in roughly 5 years.  It is the first time we have documented any significant atrial arrhythmia in roughly 14 months of pacemaker monitoring.  Although his CHA2DS2-VASc score is elevated at 2-3 (age, questionable hypertension), I think in this particular patient's case the risk of anticoagulation exceeds the benefits.  He has an unsteady gait.  He is almost 88 years old.  Unless there is a marked increase in the burden of arrhythmia or he has an actual neurological event, I would continue treatment with aspirin alone.   ?Pacemaker: As anticipated, he has a very high prevalence of ventricular pacing  due to significant AV conduction abnormalities.  Fortunately, this is not associated with any evidence of heart failure.  Changed mode to AAIR-DDDR today, while in clinic he had sinus rhythm with a long AV cond

## 2021-05-22 NOTE — Patient Instructions (Signed)

## 2021-06-20 ENCOUNTER — Ambulatory Visit (INDEPENDENT_AMBULATORY_CARE_PROVIDER_SITE_OTHER): Payer: Medicare Other

## 2021-06-20 DIAGNOSIS — I441 Atrioventricular block, second degree: Secondary | ICD-10-CM | POA: Diagnosis not present

## 2021-06-20 LAB — CUP PACEART REMOTE DEVICE CHECK
Battery Remaining Longevity: 118 mo
Battery Voltage: 3.01 V
Brady Statistic AP VP Percent: 81.67 %
Brady Statistic AP VS Percent: 0.78 %
Brady Statistic AS VP Percent: 17.04 %
Brady Statistic AS VS Percent: 0.5 %
Brady Statistic RA Percent Paced: 82.27 %
Brady Statistic RV Percent Paced: 98.72 %
Date Time Interrogation Session: 20230509065807
Implantable Lead Implant Date: 20220207
Implantable Lead Implant Date: 20220207
Implantable Lead Location: 753859
Implantable Lead Location: 753860
Implantable Lead Model: 5076
Implantable Lead Model: 5076
Implantable Pulse Generator Implant Date: 20220207
Lead Channel Impedance Value: 323 Ohm
Lead Channel Impedance Value: 361 Ohm
Lead Channel Impedance Value: 437 Ohm
Lead Channel Impedance Value: 475 Ohm
Lead Channel Pacing Threshold Amplitude: 0.625 V
Lead Channel Pacing Threshold Amplitude: 0.75 V
Lead Channel Pacing Threshold Pulse Width: 0.4 ms
Lead Channel Pacing Threshold Pulse Width: 0.4 ms
Lead Channel Sensing Intrinsic Amplitude: 3.75 mV
Lead Channel Sensing Intrinsic Amplitude: 3.75 mV
Lead Channel Sensing Intrinsic Amplitude: 6.375 mV
Lead Channel Sensing Intrinsic Amplitude: 6.375 mV
Lead Channel Setting Pacing Amplitude: 2 V
Lead Channel Setting Pacing Amplitude: 2.5 V
Lead Channel Setting Pacing Pulse Width: 0.4 ms
Lead Channel Setting Sensing Sensitivity: 1.2 mV

## 2021-07-04 NOTE — Progress Notes (Signed)
Remote pacemaker transmission.   

## 2021-09-19 ENCOUNTER — Ambulatory Visit (INDEPENDENT_AMBULATORY_CARE_PROVIDER_SITE_OTHER): Payer: Medicare Other

## 2021-09-19 DIAGNOSIS — I441 Atrioventricular block, second degree: Secondary | ICD-10-CM | POA: Diagnosis not present

## 2021-09-19 LAB — CUP PACEART REMOTE DEVICE CHECK
Battery Remaining Longevity: 116 mo
Battery Voltage: 3.01 V
Brady Statistic AP VP Percent: 79.66 %
Brady Statistic AP VS Percent: 0.85 %
Brady Statistic AS VP Percent: 18.36 %
Brady Statistic AS VS Percent: 1.13 %
Brady Statistic RA Percent Paced: 80.31 %
Brady Statistic RV Percent Paced: 98.02 %
Date Time Interrogation Session: 20230808013957
Implantable Lead Implant Date: 20220207
Implantable Lead Implant Date: 20220207
Implantable Lead Location: 753859
Implantable Lead Location: 753860
Implantable Lead Model: 5076
Implantable Lead Model: 5076
Implantable Pulse Generator Implant Date: 20220207
Lead Channel Impedance Value: 323 Ohm
Lead Channel Impedance Value: 399 Ohm
Lead Channel Impedance Value: 456 Ohm
Lead Channel Impedance Value: 475 Ohm
Lead Channel Pacing Threshold Amplitude: 0.5 V
Lead Channel Pacing Threshold Amplitude: 0.75 V
Lead Channel Pacing Threshold Pulse Width: 0.4 ms
Lead Channel Pacing Threshold Pulse Width: 0.4 ms
Lead Channel Sensing Intrinsic Amplitude: 2.875 mV
Lead Channel Sensing Intrinsic Amplitude: 2.875 mV
Lead Channel Sensing Intrinsic Amplitude: 5 mV
Lead Channel Sensing Intrinsic Amplitude: 5 mV
Lead Channel Setting Pacing Amplitude: 2 V
Lead Channel Setting Pacing Amplitude: 2.5 V
Lead Channel Setting Pacing Pulse Width: 0.4 ms
Lead Channel Setting Sensing Sensitivity: 1.2 mV

## 2021-10-25 NOTE — Progress Notes (Signed)
Remote pacemaker transmission.   

## 2021-12-19 ENCOUNTER — Ambulatory Visit (INDEPENDENT_AMBULATORY_CARE_PROVIDER_SITE_OTHER): Payer: Medicare Other

## 2021-12-19 DIAGNOSIS — I441 Atrioventricular block, second degree: Secondary | ICD-10-CM

## 2021-12-19 LAB — CUP PACEART REMOTE DEVICE CHECK
Battery Remaining Longevity: 110 mo
Battery Voltage: 3 V
Brady Statistic AP VP Percent: 73.08 %
Brady Statistic AP VS Percent: 0.21 %
Brady Statistic AS VP Percent: 26.11 %
Brady Statistic AS VS Percent: 0.6 %
Brady Statistic RA Percent Paced: 73.14 %
Brady Statistic RV Percent Paced: 99.19 %
Date Time Interrogation Session: 20231107003358
Implantable Lead Connection Status: 753985
Implantable Lead Connection Status: 753985
Implantable Lead Implant Date: 20220207
Implantable Lead Implant Date: 20220207
Implantable Lead Location: 753859
Implantable Lead Location: 753860
Implantable Lead Model: 5076
Implantable Lead Model: 5076
Implantable Pulse Generator Implant Date: 20220207
Lead Channel Impedance Value: 323 Ohm
Lead Channel Impedance Value: 361 Ohm
Lead Channel Impedance Value: 437 Ohm
Lead Channel Impedance Value: 456 Ohm
Lead Channel Pacing Threshold Amplitude: 0.625 V
Lead Channel Pacing Threshold Amplitude: 0.75 V
Lead Channel Pacing Threshold Pulse Width: 0.4 ms
Lead Channel Pacing Threshold Pulse Width: 0.4 ms
Lead Channel Sensing Intrinsic Amplitude: 4 mV
Lead Channel Sensing Intrinsic Amplitude: 4 mV
Lead Channel Sensing Intrinsic Amplitude: 5.625 mV
Lead Channel Sensing Intrinsic Amplitude: 5.625 mV
Lead Channel Setting Pacing Amplitude: 2 V
Lead Channel Setting Pacing Amplitude: 2.5 V
Lead Channel Setting Pacing Pulse Width: 0.4 ms
Lead Channel Setting Sensing Sensitivity: 1.2 mV
Zone Setting Status: 755011

## 2022-01-12 NOTE — Progress Notes (Signed)
Remote pacemaker transmission.   

## 2022-03-20 ENCOUNTER — Ambulatory Visit: Payer: Medicare Other

## 2022-03-20 DIAGNOSIS — I441 Atrioventricular block, second degree: Secondary | ICD-10-CM | POA: Diagnosis not present

## 2022-03-20 LAB — CUP PACEART REMOTE DEVICE CHECK
Battery Remaining Longevity: 108 mo
Battery Voltage: 3 V
Brady Statistic AP VP Percent: 79.85 %
Brady Statistic AP VS Percent: 0.35 %
Brady Statistic AS VP Percent: 19.21 %
Brady Statistic AS VS Percent: 0.59 %
Brady Statistic RA Percent Paced: 79.97 %
Brady Statistic RV Percent Paced: 99.06 %
Date Time Interrogation Session: 20240206002811
Implantable Lead Connection Status: 753985
Implantable Lead Connection Status: 753985
Implantable Lead Implant Date: 20220207
Implantable Lead Implant Date: 20220207
Implantable Lead Location: 753859
Implantable Lead Location: 753860
Implantable Lead Model: 5076
Implantable Lead Model: 5076
Implantable Pulse Generator Implant Date: 20220207
Lead Channel Impedance Value: 342 Ohm
Lead Channel Impedance Value: 380 Ohm
Lead Channel Impedance Value: 456 Ohm
Lead Channel Impedance Value: 456 Ohm
Lead Channel Pacing Threshold Amplitude: 0.625 V
Lead Channel Pacing Threshold Amplitude: 0.625 V
Lead Channel Pacing Threshold Pulse Width: 0.4 ms
Lead Channel Pacing Threshold Pulse Width: 0.4 ms
Lead Channel Sensing Intrinsic Amplitude: 3.875 mV
Lead Channel Sensing Intrinsic Amplitude: 3.875 mV
Lead Channel Sensing Intrinsic Amplitude: 6 mV
Lead Channel Sensing Intrinsic Amplitude: 6 mV
Lead Channel Setting Pacing Amplitude: 2 V
Lead Channel Setting Pacing Amplitude: 2.5 V
Lead Channel Setting Pacing Pulse Width: 0.4 ms
Lead Channel Setting Sensing Sensitivity: 1.2 mV
Zone Setting Status: 755011

## 2022-04-23 NOTE — Progress Notes (Signed)
Remote pacemaker transmission.   

## 2022-06-19 ENCOUNTER — Ambulatory Visit (INDEPENDENT_AMBULATORY_CARE_PROVIDER_SITE_OTHER): Payer: Medicare Other

## 2022-06-19 DIAGNOSIS — I441 Atrioventricular block, second degree: Secondary | ICD-10-CM

## 2022-06-19 LAB — CUP PACEART REMOTE DEVICE CHECK
Battery Remaining Longevity: 102 mo
Battery Voltage: 2.99 V
Brady Statistic AP VP Percent: 86.09 %
Brady Statistic AP VS Percent: 0.08 %
Brady Statistic AS VP Percent: 13.75 %
Brady Statistic AS VS Percent: 0.08 %
Brady Statistic RA Percent Paced: 86.09 %
Brady Statistic RV Percent Paced: 99.84 %
Date Time Interrogation Session: 20240507011921
Implantable Lead Connection Status: 753985
Implantable Lead Connection Status: 753985
Implantable Lead Implant Date: 20220207
Implantable Lead Implant Date: 20220207
Implantable Lead Location: 753859
Implantable Lead Location: 753860
Implantable Lead Model: 5076
Implantable Lead Model: 5076
Implantable Pulse Generator Implant Date: 20220207
Lead Channel Impedance Value: 323 Ohm
Lead Channel Impedance Value: 361 Ohm
Lead Channel Impedance Value: 418 Ohm
Lead Channel Impedance Value: 437 Ohm
Lead Channel Pacing Threshold Amplitude: 0.625 V
Lead Channel Pacing Threshold Amplitude: 0.625 V
Lead Channel Pacing Threshold Pulse Width: 0.4 ms
Lead Channel Pacing Threshold Pulse Width: 0.4 ms
Lead Channel Sensing Intrinsic Amplitude: 3.875 mV
Lead Channel Sensing Intrinsic Amplitude: 3.875 mV
Lead Channel Sensing Intrinsic Amplitude: 5.125 mV
Lead Channel Sensing Intrinsic Amplitude: 5.125 mV
Lead Channel Setting Pacing Amplitude: 2 V
Lead Channel Setting Pacing Amplitude: 2.5 V
Lead Channel Setting Pacing Pulse Width: 0.4 ms
Lead Channel Setting Sensing Sensitivity: 1.2 mV
Zone Setting Status: 755011

## 2022-06-23 ENCOUNTER — Emergency Department (HOSPITAL_COMMUNITY)
Admission: EM | Admit: 2022-06-23 | Discharge: 2022-06-24 | Disposition: A | Discharge status: Home / Self Care | Payer: Medicare Other | Attending: Emergency Medicine | Admitting: Emergency Medicine

## 2022-06-23 ENCOUNTER — Emergency Department (HOSPITAL_COMMUNITY): Payer: Medicare Other

## 2022-06-23 ENCOUNTER — Other Ambulatory Visit: Payer: Self-pay

## 2022-06-23 DIAGNOSIS — Z23 Encounter for immunization: Secondary | ICD-10-CM | POA: Diagnosis not present

## 2022-06-23 DIAGNOSIS — J90 Pleural effusion, not elsewhere classified: Secondary | ICD-10-CM | POA: Insufficient documentation

## 2022-06-23 DIAGNOSIS — I10 Essential (primary) hypertension: Secondary | ICD-10-CM | POA: Insufficient documentation

## 2022-06-23 DIAGNOSIS — I700 Atherosclerosis of aorta: Secondary | ICD-10-CM | POA: Diagnosis not present

## 2022-06-23 DIAGNOSIS — I7121 Aneurysm of the ascending aorta, without rupture: Secondary | ICD-10-CM | POA: Diagnosis not present

## 2022-06-23 DIAGNOSIS — K449 Diaphragmatic hernia without obstruction or gangrene: Secondary | ICD-10-CM | POA: Diagnosis not present

## 2022-06-23 DIAGNOSIS — Z7982 Long term (current) use of aspirin: Secondary | ICD-10-CM | POA: Diagnosis not present

## 2022-06-23 DIAGNOSIS — S0100XA Unspecified open wound of scalp, initial encounter: Secondary | ICD-10-CM | POA: Insufficient documentation

## 2022-06-23 DIAGNOSIS — S51812A Laceration without foreign body of left forearm, initial encounter: Secondary | ICD-10-CM | POA: Diagnosis not present

## 2022-06-23 DIAGNOSIS — S20411A Abrasion of right back wall of thorax, initial encounter: Secondary | ICD-10-CM | POA: Insufficient documentation

## 2022-06-23 DIAGNOSIS — I2510 Atherosclerotic heart disease of native coronary artery without angina pectoris: Secondary | ICD-10-CM | POA: Diagnosis not present

## 2022-06-23 DIAGNOSIS — I6789 Other cerebrovascular disease: Secondary | ICD-10-CM | POA: Diagnosis not present

## 2022-06-23 DIAGNOSIS — W19XXXA Unspecified fall, initial encounter: Secondary | ICD-10-CM

## 2022-06-23 DIAGNOSIS — W01198A Fall on same level from slipping, tripping and stumbling with subsequent striking against other object, initial encounter: Secondary | ICD-10-CM | POA: Diagnosis not present

## 2022-06-23 LAB — CBC WITH DIFFERENTIAL/PLATELET
Abs Immature Granulocytes: 0.03 10*3/uL (ref 0.00–0.07)
Basophils Absolute: 0 10*3/uL (ref 0.0–0.1)
Basophils Relative: 0 %
Eosinophils Absolute: 0 10*3/uL (ref 0.0–0.5)
Eosinophils Relative: 1 %
HCT: 38 % — ABNORMAL LOW (ref 39.0–52.0)
Hemoglobin: 12.3 g/dL — ABNORMAL LOW (ref 13.0–17.0)
Immature Granulocytes: 1 %
Lymphocytes Relative: 17 %
Lymphs Abs: 1.1 10*3/uL (ref 0.7–4.0)
MCH: 33 pg (ref 26.0–34.0)
MCHC: 32.4 g/dL (ref 30.0–36.0)
MCV: 101.9 fL — ABNORMAL HIGH (ref 80.0–100.0)
Monocytes Absolute: 0.5 10*3/uL (ref 0.1–1.0)
Monocytes Relative: 8 %
Neutro Abs: 4.6 10*3/uL (ref 1.7–7.7)
Neutrophils Relative %: 73 %
Platelets: 147 10*3/uL — ABNORMAL LOW (ref 150–400)
RBC: 3.73 MIL/uL — ABNORMAL LOW (ref 4.22–5.81)
RDW: 14.7 % (ref 11.5–15.5)
WBC: 6.3 10*3/uL (ref 4.0–10.5)
nRBC: 0 % (ref 0.0–0.2)

## 2022-06-23 LAB — BASIC METABOLIC PANEL
Anion gap: 9 (ref 5–15)
BUN: 23 mg/dL (ref 8–23)
CO2: 24 mmol/L (ref 22–32)
Calcium: 9 mg/dL (ref 8.9–10.3)
Chloride: 102 mmol/L (ref 98–111)
Creatinine, Ser: 0.78 mg/dL (ref 0.61–1.24)
GFR, Estimated: >60 mL/min (ref >60)
Glucose, Bld: 128 mg/dL — ABNORMAL HIGH (ref 70–99)
Potassium: 4.1 mmol/L (ref 3.5–5.1)
Sodium: 135 mmol/L (ref 135–145)

## 2022-06-23 LAB — I-STAT CHEM 8, ED
BUN: 28 mg/dL — ABNORMAL HIGH (ref 8–23)
Calcium, Ion: 1.03 mmol/L — ABNORMAL LOW (ref 1.15–1.40)
Chloride: 105 mmol/L (ref 98–111)
Creatinine, Ser: 0.7 mg/dL (ref 0.61–1.24)
Glucose, Bld: 122 mg/dL — ABNORMAL HIGH (ref 70–99)
HCT: 36 % — ABNORMAL LOW (ref 39.0–52.0)
Hemoglobin: 12.2 g/dL — ABNORMAL LOW (ref 13.0–17.0)
Potassium: 4.3 mmol/L (ref 3.5–5.1)
Sodium: 137 mmol/L (ref 135–145)
TCO2: 23 mmol/L (ref 22–32)

## 2022-06-23 MED ORDER — IOHEXOL 350 MG/ML SOLN
50.0000 mL | Freq: Once | INTRAVENOUS | Status: AC | PRN
  Administered 2022-06-23: 50 mL via INTRAVENOUS

## 2022-06-23 MED ORDER — ACETAMINOPHEN 325 MG PO TABS
650.0000 mg | ORAL_TABLET | Freq: Once | ORAL | Status: AC
  Administered 2022-06-23: 650 mg via ORAL
  Filled 2022-06-23: qty 2

## 2022-06-23 MED ORDER — LIDOCAINE-EPINEPHRINE-TETRACAINE (LET) TOPICAL GEL
3.0000 mL | Freq: Once | TOPICAL | Status: AC
  Administered 2022-06-23: 3 mL via TOPICAL
  Filled 2022-06-23: qty 3

## 2022-06-23 MED ORDER — TETANUS-DIPHTH-ACELL PERTUSSIS 5-2.5-18.5 LF-MCG/0.5 IM SUSY
0.5000 mL | PREFILLED_SYRINGE | Freq: Once | INTRAMUSCULAR | Status: AC
  Administered 2022-06-23: 0.5 mL via INTRAMUSCULAR
  Filled 2022-06-23: qty 0.5

## 2022-06-23 NOTE — ED Notes (Signed)
Returned from CT.

## 2022-06-23 NOTE — ED Provider Notes (Signed)
[place] EMERGENCY DEPARTMENT AT [facility] Provider Note   CSN: [id number] Arrival date & time: 06/23/22  1845     History  Chief Complaint  Patient presents with   Fall    Pt fell over backwards and hit the back of his head on the wooden rocker. He also has a skin tear on his L forearm.    Terry Gregory is a 89 y.o. male.  HPI Patient presents after a fall.  Medical history includes RBBB, pericardial effusion, HTN, atrial flutter, BPH.  He takes a baby aspirin daily.  He lives with his daughter.  At baseline, he has poor balance and ambulates with a walker.  This evening, he describes an episode where he was leaning forward to look at something on the floor.  This caused him to lose his balance and he ultimately fell backwards.  It is believed that he struck the back of his head against the foot of a rocking chair.  He did sustain a wound to the back of his head.  EMS was called to the scene.  EMS noted hypertension during transit.  Vital signs were otherwise normal.  Patient reportedly has a history of hypertension but is not currently on any medications for it.    Home Medications Prior to Admission medications   Medication Sig Start Date End Date Taking? Authorizing Provider  aspirin EC 81 MG tablet Take 81 mg by mouth daily.    [provider]  finasteride (PROSCAR) 5 MG tablet Take 5 mg by mouth daily. 09/27/17   [provider]      Allergies    Erythromycin and Penicillins    Review of Systems   Review of Systems  Skin:  Positive for wound.  Neurological:  Positive for headaches.  All other systems reviewed and are negative.   Physical Exam Updated Vital Signs BP (!) 176/99   Pulse 68   Temp 98 F (36.7 C) (Oral)   Resp 13   Ht 6\' 3"  (1.905 m)   Wt 95.3 kg   SpO2 98%   BMI 26.25 kg/m  Physical Exam Vitals and nursing note reviewed.  Constitutional:      General: He is not in acute distress.    Appearance: Normal appearance.  He is well-developed. He is not ill-appearing, toxic-appearing or diaphoretic.  HENT:     Head: Normocephalic.     Comments: Small hematoma and wounds to occiput    Right Ear: External ear normal.     Left Ear: External ear normal.     Nose: Nose normal.     Mouth/Throat:     Mouth: Mucous membranes are moist.  Eyes:     Extraocular Movements: Extraocular movements intact.     Conjunctiva/sclera: Conjunctivae normal.  Cardiovascular:     Rate and Rhythm: Normal rate and regular rhythm.  Pulmonary:     Effort: Pulmonary effort is normal. No respiratory distress.  Chest:     Chest wall: No tenderness.  Abdominal:     General: There is no distension.     Palpations: Abdomen is soft.     Tenderness: There is no abdominal tenderness.  Musculoskeletal:        General: No swelling.     Cervical back: Normal range of motion and neck supple.     Right lower leg: No edema.     Left lower leg: No edema.     Comments: Swelling and abrasion to right thoracic back.  Skin:  General: Skin is warm and dry.     Capillary Refill: Capillary refill takes less than 2 seconds.     Coloration: Skin is not jaundiced or pale.     Comments: Small skin tear to left forearm  Neurological:     General: No focal deficit present.     Mental Status: He is alert and oriented to person, place, and time.     Cranial Nerves: No cranial nerve deficit.     Sensory: No sensory deficit.     Motor: No weakness.     Coordination: Coordination normal.  Psychiatric:        Mood and Affect: Mood normal.        Behavior: Behavior normal.        Thought Content: Thought content normal.        Judgment: Judgment normal.     ED Results / Procedures / Treatments   Labs (all labs ordered are listed, but only abnormal results are displayed) Labs Reviewed  BASIC METABOLIC PANEL - Abnormal; Notable for the following components:      Result Value   Glucose, Bld 128 (*)    All other components within normal limits   CBC WITH DIFFERENTIAL/PLATELET - Abnormal; Notable for the following components:   RBC 3.73 (*)    Hemoglobin 12.3 (*)    HCT 38.0 (*)    MCV 101.9 (*)    Platelets 147 (*)    All other components within normal limits  I-STAT CHEM 8, ED - Abnormal; Notable for the following components:   BUN 28 (*)    Glucose, Bld 122 (*)    Calcium, Ion 1.03 (*)    Hemoglobin 12.2 (*)    HCT 36.0 (*)    All other components within normal limits    EKG EKG Interpretation  Date/Time:  Saturday Jun 23 2022 19:09:56 EDT Ventricular Rate:  68 PR Interval:    QRS Duration: 213 QT Interval:  505 QTC Calculation: 538 R Axis:   -72 Text Interpretation: VENTRICULAR PACED RHYTHM Ventricular premature complex Confirmed by Gloris Manchester [phone]) on 06/23/2022 8:10:04 PM  Radiology DG Chest Port 1 View  Result Date: 06/23/2022 CLINICAL DATA:  Trauma. EXAM: PORTABLE CHEST 1 VIEW COMPARISON:  03/21/2020, 12/21/2014. FINDINGS: Chest: The heart is enlarged and mediastinal contours are within normal limits. Atherosclerotic calcification of the aorta is noted. Interstitial prominence is noted bilaterally and there is mild airspace disease at the lung bases. No effusion or pneumothorax. A dual lead pacemaker is present over the left chest. No acute osseous abnormality. Cholecystectomy clips are present in the right upper quadrant. IMPRESSION: Interstitial prominence bilaterally with mild airspace at the lung bases, portable atelectasis, edema, or infiltrate. Electronically Signed   By: Thornell Sartorius M.D.   On: 06/23/2022 20:16   DG Pelvis Portable  Result Date: 06/23/2022 CLINICAL DATA:  Trauma. EXAM: PORTABLE CHEST 1 VIEW COMPARISON:  03/21/2020, 12/21/2014. FINDINGS: Chest: The heart is enlarged and mediastinal contours are within normal limits. Atherosclerotic calcification of the aorta is noted. Interstitial prominence is noted bilaterally and there is mild airspace disease at the lung bases. No effusion or  pneumothorax. A dual lead pacemaker is present over the left chest. No acute osseous abnormality. Cholecystectomy clips are present in the right upper quadrant. IMPRESSION: Interstitial prominence bilaterally with mild airspace at the lung bases, portable atelectasis, edema, or infiltrate. Electronically Signed   By: Thornell Sartorius M.D.   On: 06/23/2022 20:16   CT CERVICAL SPINE WO  CONTRAST  Result Date: 06/23/2022 CLINICAL DATA:  Status post trauma. EXAM: CT CERVICAL SPINE WITHOUT CONTRAST TECHNIQUE: Multidetector CT imaging of the cervical spine was performed without intravenous contrast. Multiplanar CT image reconstructions were also generated. RADIATION DOSE REDUCTION: This exam was performed according to the departmental dose-optimization program which includes automated exposure control, adjustment of the mA and/or kV according to patient size and/or use of iterative reconstruction technique. COMPARISON:  None Available. FINDINGS: Alignment: Normal. Skull base and vertebrae: No acute fracture. No primary bone lesion or focal pathologic process. Soft tissues and spinal canal: No prevertebral fluid or swelling. No visible canal hematoma. Disc levels: Moderate to marked severity multilevel endplate sclerosis is seen. This is most prominent at the level of C6-C7. Mild to moderate severity multilevel anterior osteophyte formation and posterior bony spurring is also seen. There is marked severity narrowing of the anterior atlantoaxial articulation. Moderate to marked severity intervertebral disc space narrowing is seen, most prominent at the levels of C5-C6 and C6-C7. Bilateral marked severity multilevel facet joint hypertrophy is noted. Upper chest: Mild, anterior right apical scarring and/or atelectasis is seen. Other: None. IMPRESSION: 1. No acute fracture or subluxation in the cervical spine. 2. Moderate to marked severity multilevel degenerative changes. 3. Mild, anterior right apical scarring and/or  atelectasis. Electronically Signed   By: Aram Candela M.D.   On: 06/23/2022 20:09   CT HEAD WO CONTRAST  Result Date: 06/23/2022 CLINICAL DATA:  Status post trauma. EXAM: CT HEAD WITHOUT CONTRAST TECHNIQUE: Contiguous axial images were obtained from the base of the skull through the vertex without intravenous contrast. RADIATION DOSE REDUCTION: This exam was performed according to the departmental dose-optimization program which includes automated exposure control, adjustment of the mA and/or kV according to patient size and/or use of iterative reconstruction technique. COMPARISON:  December 21, 2014 FINDINGS: Brain: There is mild cerebral atrophy with widening of the extra-axial spaces and ventricular dilatation. There are areas of decreased attenuation within the white matter tracts of the supratentorial brain, consistent with microvascular disease changes. Vascular: No hyperdense vessel or unexpected calcification. Skull: Normal. Negative for fracture or focal lesion. Sinuses/Orbits: There is mild to moderate severity left ethmoid sinus mucosal thickening. Other: Mild occipital scalp soft tissue swelling is seen along the midline. IMPRESSION: 1. Mild occipital scalp soft tissue swelling without evidence for an acute fracture or acute intracranial abnormality. 2. Generalized cerebral atrophy and microvascular disease changes of the supratentorial brain. Electronically Signed   By: Aram Candela M.D.   On: 06/23/2022 19:51    Procedures Procedures    Medications Ordered in ED Medications  Tdap (BOOSTRIX) injection 0.5 mL (0.5 mLs Intramuscular Given 06/23/22 1910)  acetaminophen (TYLENOL) tablet 650 mg (650 mg Oral Given 06/23/22 1911)  lidocaine-EPINEPHrine-tetracaine (LET) topical gel (3 mLs Topical Given 06/23/22 1950)    ED Course/ Medical Decision Making/ A&P                             Medical Decision Making Amount and/or Complexity of Data Reviewed Labs: ordered. Radiology:  ordered. ECG/medicine tests: ordered.  Risk OTC drugs. Prescription drug management.   This patient presents to the ED for concern of fall, this involves an extensive number of treatment options, and is a complaint that carries with it a high risk of complications and morbidity.  The differential diagnosis includes acute injuries   Co morbidities that complicate the patient evaluation  RBBB, pericardial effusion, HTN, atrial flutter, BPH  Additional history obtained:  Additional history obtained from patient's daughter External records from outside source obtained and reviewed including EMR   Lab Tests:  I Ordered, and personally interpreted labs.  The pertinent results include: Anemia improved from baseline, baseline macrocytosis, no leukocytosis, normal kidney function   Imaging Studies ordered:  I ordered imaging studies including x-ray of chest and pelvis, CT scan of head, cervical spine, and chest I independently visualized and interpreted imaging which showed no acute findings on CT of chest or cervical spine.  Chest x-ray shows some interstitial prominence bilaterally with mild airspace disease at lung bases.  This to be further evaluated on CT.  CT was pending at time of signout. I agree with the radiologist interpretation   Cardiac Monitoring: / EKG:  The patient was maintained on a cardiac monitor.  I personally viewed and interpreted the cardiac monitored which showed an underlying rhythm of: Paced rhythm   Problem List / ED Course / Critical interventions / Medication management  Patient presents after mechanical fall.  During this fall, he did sustain a wound to the back of his head as well as a small skin tear to his left forearm.  Patient arrives via EMS.  On arrival, patient is alert and well-appearing.  Tylenol was ordered for analgesia.  CT imaging was ordered.  Daughter reports last tetanus was in 2018.  Tetanus was updated.  No acute findings were  identified on CT of head and cervical spine.  Occipital scalp wound was cleaned and wound consisted only of macerated skin.  Dermabond was applied.  At time of signout, CT of chest was pending. I ordered medication including Tylenol for analgesia; Tdap for tetanus prophylaxis; let gel for topical anesthetic Reevaluation of the patient after these medicines showed that the patient improved I have reviewed the patients home medicines and have made adjustments as needed   Social Determinants of Health:  Lives at home with family         Final Clinical Impression(s) / ED Diagnoses Final diagnoses:  Fall, initial encounter    Rx / DC Orders ED Discharge Orders     None         Gloris Manchester, MD 06/23/22 2131

## 2022-06-23 NOTE — ED Notes (Signed)
Dermabond at bedside.  

## 2022-06-23 NOTE — ED Notes (Signed)
Family informed RN that pt had soiled his depends. Sheets, gown, and depends changed. Soiled linen removed from room. New dressing applied to wound on L elbow

## 2022-07-11 NOTE — Progress Notes (Signed)
Remote pacemaker transmission.   

## 2022-08-24 ENCOUNTER — Inpatient Hospital Stay (HOSPITAL_BASED_OUTPATIENT_CLINIC_OR_DEPARTMENT_OTHER)
Admission: EM | Admit: 2022-08-24 | Discharge: 2022-08-26 | DRG: 689 | Disposition: A | Discharge status: Home Health | Payer: Medicare Other | Attending: Internal Medicine | Admitting: Internal Medicine

## 2022-08-24 ENCOUNTER — Emergency Department (HOSPITAL_BASED_OUTPATIENT_CLINIC_OR_DEPARTMENT_OTHER): Payer: Medicare Other

## 2022-08-24 ENCOUNTER — Other Ambulatory Visit: Payer: Self-pay

## 2022-08-24 ENCOUNTER — Encounter (HOSPITAL_BASED_OUTPATIENT_CLINIC_OR_DEPARTMENT_OTHER): Payer: Self-pay

## 2022-08-24 DIAGNOSIS — N400 Enlarged prostate without lower urinary tract symptoms: Secondary | ICD-10-CM | POA: Diagnosis present

## 2022-08-24 DIAGNOSIS — R410 Disorientation, unspecified: Secondary | ICD-10-CM | POA: Diagnosis not present

## 2022-08-24 DIAGNOSIS — I5043 Acute on chronic combined systolic (congestive) and diastolic (congestive) heart failure: Secondary | ICD-10-CM | POA: Diagnosis present

## 2022-08-24 DIAGNOSIS — I447 Left bundle-branch block, unspecified: Secondary | ICD-10-CM | POA: Diagnosis present

## 2022-08-24 DIAGNOSIS — N179 Acute kidney failure, unspecified: Secondary | ICD-10-CM | POA: Diagnosis present

## 2022-08-24 DIAGNOSIS — Z79899 Other long term (current) drug therapy: Secondary | ICD-10-CM

## 2022-08-24 DIAGNOSIS — Z881 Allergy status to other antibiotic agents status: Secondary | ICD-10-CM | POA: Diagnosis not present

## 2022-08-24 DIAGNOSIS — I483 Typical atrial flutter: Secondary | ICD-10-CM | POA: Diagnosis present

## 2022-08-24 DIAGNOSIS — Z66 Do not resuscitate: Secondary | ICD-10-CM | POA: Diagnosis present

## 2022-08-24 DIAGNOSIS — I5033 Acute on chronic diastolic (congestive) heart failure: Secondary | ICD-10-CM | POA: Diagnosis present

## 2022-08-24 DIAGNOSIS — Z8249 Family history of ischemic heart disease and other diseases of the circulatory system: Secondary | ICD-10-CM

## 2022-08-24 DIAGNOSIS — G9341 Metabolic encephalopathy: Secondary | ICD-10-CM | POA: Diagnosis present

## 2022-08-24 DIAGNOSIS — I4891 Unspecified atrial fibrillation: Secondary | ICD-10-CM | POA: Diagnosis present

## 2022-08-24 DIAGNOSIS — I110 Hypertensive heart disease with heart failure: Secondary | ICD-10-CM | POA: Diagnosis present

## 2022-08-24 DIAGNOSIS — N390 Urinary tract infection, site not specified: Secondary | ICD-10-CM | POA: Diagnosis present

## 2022-08-24 DIAGNOSIS — I10 Essential (primary) hypertension: Secondary | ICD-10-CM | POA: Diagnosis present

## 2022-08-24 DIAGNOSIS — R001 Bradycardia, unspecified: Secondary | ICD-10-CM | POA: Diagnosis present

## 2022-08-24 DIAGNOSIS — F05 Delirium due to known physiological condition: Secondary | ICD-10-CM | POA: Diagnosis not present

## 2022-08-24 DIAGNOSIS — Z9049 Acquired absence of other specified parts of digestive tract: Secondary | ICD-10-CM | POA: Diagnosis not present

## 2022-08-24 DIAGNOSIS — R1310 Dysphagia, unspecified: Secondary | ICD-10-CM | POA: Diagnosis present

## 2022-08-24 DIAGNOSIS — Z9842 Cataract extraction status, left eye: Secondary | ICD-10-CM

## 2022-08-24 DIAGNOSIS — I5031 Acute diastolic (congestive) heart failure: Secondary | ICD-10-CM | POA: Diagnosis not present

## 2022-08-24 DIAGNOSIS — Z96641 Presence of right artificial hip joint: Secondary | ICD-10-CM | POA: Diagnosis present

## 2022-08-24 DIAGNOSIS — N3000 Acute cystitis without hematuria: Principal | ICD-10-CM | POA: Diagnosis present

## 2022-08-24 DIAGNOSIS — Z9841 Cataract extraction status, right eye: Secondary | ICD-10-CM

## 2022-08-24 DIAGNOSIS — Z7982 Long term (current) use of aspirin: Secondary | ICD-10-CM

## 2022-08-24 DIAGNOSIS — Z880 Allergy status to penicillin: Secondary | ICD-10-CM

## 2022-08-24 DIAGNOSIS — R4182 Altered mental status, unspecified: Principal | ICD-10-CM

## 2022-08-24 LAB — URINALYSIS, MICROSCOPIC (REFLEX)
RBC / HPF: >50 RBC/hpf (ref 0–5)
WBC, UA: >50 WBC/hpf (ref 0–5)

## 2022-08-24 LAB — URINALYSIS, ROUTINE W REFLEX MICROSCOPIC
Bilirubin Urine: NEGATIVE
Glucose, UA: NEGATIVE mg/dL
Ketones, ur: NEGATIVE mg/dL
Nitrite: NEGATIVE
Protein, ur: 30 mg/dL — AB
Specific Gravity, Urine: >=1.03 (ref 1.005–1.030)
pH: 6 (ref 5.0–8.0)

## 2022-08-24 LAB — CBC WITH DIFFERENTIAL/PLATELET
Abs Immature Granulocytes: 0.04 10*3/uL (ref 0.00–0.07)
Basophils Absolute: 0 10*3/uL (ref 0.0–0.1)
Basophils Relative: 0 %
Eosinophils Absolute: 0 10*3/uL (ref 0.0–0.5)
Eosinophils Relative: 0 %
HCT: 33.9 % — ABNORMAL LOW (ref 39.0–52.0)
Hemoglobin: 11.3 g/dL — ABNORMAL LOW (ref 13.0–17.0)
Immature Granulocytes: 0 %
Lymphocytes Relative: 6 %
Lymphs Abs: 0.8 10*3/uL (ref 0.7–4.0)
MCH: 33.2 pg (ref 26.0–34.0)
MCHC: 33.3 g/dL (ref 30.0–36.0)
MCV: 99.7 fL (ref 80.0–100.0)
Monocytes Absolute: 0.3 10*3/uL (ref 0.1–1.0)
Monocytes Relative: 3 %
Neutro Abs: 10.9 10*3/uL — ABNORMAL HIGH (ref 1.7–7.7)
Neutrophils Relative %: 91 %
Platelets: 157 10*3/uL (ref 150–400)
RBC: 3.4 MIL/uL — ABNORMAL LOW (ref 4.22–5.81)
RDW: 16.1 % — ABNORMAL HIGH (ref 11.5–15.5)
WBC: 12.1 10*3/uL — ABNORMAL HIGH (ref 4.0–10.5)
nRBC: 0 % (ref 0.0–0.2)

## 2022-08-24 LAB — COMPREHENSIVE METABOLIC PANEL
ALT: 18 U/L (ref 0–44)
AST: 25 U/L (ref 15–41)
Albumin: 3.1 g/dL — ABNORMAL LOW (ref 3.5–5.0)
Alkaline Phosphatase: 86 U/L (ref 38–126)
Anion gap: 10 (ref 5–15)
BUN: 30 mg/dL — ABNORMAL HIGH (ref 8–23)
CO2: 23 mmol/L (ref 22–32)
Calcium: 8.8 mg/dL — ABNORMAL LOW (ref 8.9–10.3)
Chloride: 101 mmol/L (ref 98–111)
Creatinine, Ser: 0.99 mg/dL (ref 0.61–1.24)
GFR, Estimated: >60 mL/min (ref >60)
Glucose, Bld: 189 mg/dL — ABNORMAL HIGH (ref 70–99)
Potassium: 4.2 mmol/L (ref 3.5–5.1)
Sodium: 134 mmol/L — ABNORMAL LOW (ref 135–145)
Total Bilirubin: 0.6 mg/dL (ref 0.3–1.2)
Total Protein: 6.7 g/dL (ref 6.5–8.1)

## 2022-08-24 LAB — TROPONIN I (HIGH SENSITIVITY): Troponin I (High Sensitivity): 10 ng/L (ref <18)

## 2022-08-24 LAB — I-STAT VENOUS BLOOD GAS, ED
Acid-Base Excess: 2 mmol/L (ref 0.0–2.0)
Bicarbonate: 27.7 mmol/L (ref 20.0–28.0)
Calcium, Ion: 1.24 mmol/L (ref 1.15–1.40)
HCT: 35 % — ABNORMAL LOW (ref 39.0–52.0)
Hemoglobin: 11.9 g/dL — ABNORMAL LOW (ref 13.0–17.0)
O2 Saturation: 80 %
Patient temperature: 97.5
Potassium: 4.3 mmol/L (ref 3.5–5.1)
Sodium: 136 mmol/L (ref 135–145)
TCO2: 29 mmol/L (ref 22–32)
pCO2, Ven: 43.5 mmHg — ABNORMAL LOW (ref 44–60)
pH, Ven: 7.41 (ref 7.25–7.43)
pO2, Ven: 42 mmHg (ref 32–45)

## 2022-08-24 LAB — CBG MONITORING, ED: Glucose-Capillary: 186 mg/dL — ABNORMAL HIGH (ref 70–99)

## 2022-08-24 LAB — BRAIN NATRIURETIC PEPTIDE: B Natriuretic Peptide: 378.6 pg/mL — ABNORMAL HIGH (ref 0.0–100.0)

## 2022-08-24 MED ORDER — FUROSEMIDE 10 MG/ML IJ SOLN
20.0000 mg | Freq: Once | INTRAMUSCULAR | Status: AC
  Administered 2022-08-24: 20 mg via INTRAVENOUS
  Filled 2022-08-24: qty 2

## 2022-08-24 MED ORDER — SODIUM CHLORIDE 0.9 % IV SOLN
1.0000 g | Freq: Once | INTRAVENOUS | Status: AC
  Administered 2022-08-24: 1 g via INTRAVENOUS
  Filled 2022-08-24: qty 10

## 2022-08-24 MED ORDER — SODIUM CHLORIDE 0.9 % IV SOLN
100.0000 mg | Freq: Once | INTRAVENOUS | Status: AC
  Administered 2022-08-24: 100 mg via INTRAVENOUS
  Filled 2022-08-24: qty 100

## 2022-08-24 NOTE — ED Notes (Signed)
Patient given meal and a drink.  Family at bedside to assist.

## 2022-08-24 NOTE — ED Notes (Signed)
ED TO INPATIENT HANDOFF REPORT  ED Nurse Name and Phone #: Evette Cristal, RN [phone]  S Name/Age/Gender Terry Gregory 89 y.o. male Room/Bed: MH06/MH06  Code Status   Code Status: Prior  Home/SNF/Other Home Patient oriented to: self, place, time, and situation Is this baseline? Yes   Triage Complete: Triage complete  Chief Complaint Metabolic encephalopathy [G93.41] Acute metabolic encephalopathy [G93.41]  Triage Note Patient has been sleeping more often since Monday. He has been having episodes of confusion. Family stated that his mouth is more drawn that normal and he is drooling over the last week.    Allergies Allergies  Allergen Reactions   Erythromycin Hives and Rash   Penicillins Hives and Rash    .Marland KitchenHas patient had a PCN reaction causing immediate rash, facial/tongue/throat swelling, SOB or lightheadedness with hypotension: Yes Has patient had a PCN reaction causing severe rash involving mucus membranes or skin necrosis: No Has patient had a PCN reaction that required hospitalization No Has patient had a PCN reaction occurring within the last 10 years: No If all of the above answers are "NO", then may proceed with Cephalosporin use.     Level of Care/Admitting Diagnosis ED Disposition     ED Disposition  Admit   Condition  --   Comment  Hospital Area: [facility] [[id number]]  Level of Care: Telemetry Cardiac [[id number]]  May admit patient to [facility] or [facility] if equivalent level of care is available:: No  Interfacility transfer: Yes  Covid Evaluation: Asymptomatic - no recent exposure (last 10 days) testing not required  Diagnosis: Acute metabolic encephalopathy [2831517]  Admitting Physician: Coralie Keens [[id number]]  Attending Physician: Coralie Keens [[id number]]  Certification:: I certify this patient will need inpatient services for at least 2 midnights          B Medical/Surgery History Past Medical History:   Diagnosis Date   BPH (benign prostatic hyperplasia)    Diverticulosis    Pericardial effusion    Past Surgical History:  Procedure Laterality Date   APPENDECTOMY  1960   CATARACT EXTRACTION  2009   both eyes   CHOLECYSTECTOMY  2000   ERCP N/A 06/24/2020   Procedure: ENDOSCOPIC RETROGRADE CHOLANGIOPANCREATOGRAPHY (ERCP);  Surgeon: Kerin Salen, MD;  Location: [facility] ENDOSCOPY;  Service: Gastroenterology;  Laterality: N/A;   INGUINAL HERNIA REPAIR  1980's   PACEMAKER IMPLANT N/A 03/21/2020   Procedure: PACEMAKER IMPLANT;  Surgeon: Thurmon Fair, MD;  Location: [facility] INVASIVE CV LAB;  Service: Cardiovascular;  Laterality: N/A;   REMOVAL OF STONES  06/24/2020   Procedure: REMOVAL OF STONES;  Surgeon: Kerin Salen, MD;  Location: [facility] ENDOSCOPY;  Service: Gastroenterology;;   Dennison Mascot  06/24/2020   Procedure: Dennison Mascot;  Surgeon: Kerin Salen, MD;  Location: [facility] ENDOSCOPY;  Service: Gastroenterology;;   STONE EXTRACTION WITH BASKET  06/24/2020   Procedure: STONE EXTRACTION WITH BASKET;  Surgeon: Kerin Salen, MD;  Location: [facility] ENDOSCOPY;  Service: Gastroenterology;;  Basket crushed stones   TOTAL HIP ARTHROPLASTY  2003   right   WRIST SURGERY  2009   left     A IV Location/Drains/Wounds Patient Lines/Drains/Airways Status     Active Line/Drains/Airways     Name Placement date Placement time Site Days   Peripheral IV 08/24/22 20 G Left Antecubital 08/24/22  1407  Antecubital  less than 1            Intake/Output Last 24 hours  Intake/Output Summary (Last 24 hours) at 08/24/2022 2048 Last data filed  at 08/24/2022 2024 Gross per 24 hour  Intake 350 ml  Output --  Net 350 ml    Labs/Imaging Results for orders placed or performed during the hospital encounter of 08/24/22 (from the past 48 hour(s))  CBG monitoring, ED     Status: Abnormal   Collection Time: 08/24/22  2:01 PM  Result Value Ref Range   Glucose-Capillary 186 (H) 70 - 99 mg/dL    Comment: Glucose reference range  applies only to samples taken after fasting for at least 8 hours.  Comprehensive metabolic panel     Status: Abnormal   Collection Time: 08/24/22  2:03 PM  Result Value Ref Range   Sodium 134 (L) 135 - 145 mmol/L   Potassium 4.2 3.5 - 5.1 mmol/L   Chloride 101 98 - 111 mmol/L   CO2 23 22 - 32 mmol/L   Glucose, Bld 189 (H) 70 - 99 mg/dL    Comment: Glucose reference range applies only to samples taken after fasting for at least 8 hours.   BUN 30 (H) 8 - 23 mg/dL   Creatinine, Ser 1.61 0.61 - 1.24 mg/dL   Calcium 8.8 (L) 8.9 - 10.3 mg/dL   Total Protein 6.7 6.5 - 8.1 g/dL   Albumin 3.1 (L) 3.5 - 5.0 g/dL   AST 25 15 - 41 U/L   ALT 18 0 - 44 U/L   Alkaline Phosphatase 86 38 - 126 U/L   Total Bilirubin 0.6 0.3 - 1.2 mg/dL   GFR, Estimated >09 >60 mL/min    Comment: (NOTE) Calculated using the CKD-EPI Creatinine Equation (2021)    Anion gap 10 5 - 15    Comment: Performed at [facility], [address] [place], [place] [address]  CBC with Differential     Status: Abnormal   Collection Time: 08/24/22  2:03 PM  Result Value Ref Range   WBC 12.1 (H) 4.0 - 10.5 K/uL   RBC 3.40 (L) 4.22 - 5.81 MIL/uL   Hemoglobin 11.3 (L) 13.0 - 17.0 g/dL   HCT 81.1 (L) 91.4 - 78.2 %   MCV 99.7 80.0 - 100.0 fL   MCH 33.2 26.0 - 34.0 pg   MCHC 33.3 30.0 - 36.0 g/dL   RDW 95.6 (H) 21.3 - 08.6 %   Platelets 157 150 - 400 K/uL   nRBC 0.0 0.0 - 0.2 %   Neutrophils Relative % 91 %   Neutro Abs 10.9 (H) 1.7 - 7.7 K/uL   Lymphocytes Relative 6 %   Lymphs Abs 0.8 0.7 - 4.0 K/uL   Monocytes Relative 3 %   Monocytes Absolute 0.3 0.1 - 1.0 K/uL   Eosinophils Relative 0 %   Eosinophils Absolute 0.0 0.0 - 0.5 K/uL   Basophils Relative 0 %   Basophils Absolute 0.0 0.0 - 0.1 K/uL   Immature Granulocytes 0 %   Abs Immature Granulocytes 0.04 0.00 - 0.07 K/uL    Comment: Performed at [facility], [address] [place], [place] [address]  Brain natriuretic peptide     Status: Abnormal    Collection Time: 08/24/22  2:03 PM  Result Value Ref Range   B Natriuretic Peptide 378.6 (H) 0.0 - 100.0 pg/mL    Comment: Performed at [facility], [address] [place], [place] [address]  Troponin I (High Sensitivity)     Status: None   Collection Time: 08/24/22  2:03 PM  Result Value Ref Range   Troponin  I (High Sensitivity) 10 <18 ng/L    Comment: (NOTE) Elevated high sensitivity troponin I (hsTnI) values and significant  changes across serial measurements may suggest ACS but many other  chronic and acute conditions are known to elevate hsTnI results.  Refer to the "Links" section for chest pain algorithms and additional  guidance. Performed at [facility], [address] [place], [place] [address]   I-Stat venous blood gas, [facility] ED, [facility], [facility])     Status: Abnormal   Collection Time: 08/24/22  4:14 PM  Result Value Ref Range   pH, Ven 7.410 7.25 - 7.43   pCO2, Ven 43.5 (L) 44 - 60 mmHg   pO2, Ven 42 32 - 45 mmHg   Bicarbonate 27.7 20.0 - 28.0 mmol/L   TCO2 29 22 - 32 mmol/L   O2 Saturation 80 %   Acid-Base Excess 2.0 0.0 - 2.0 mmol/L   Sodium 136 135 - 145 mmol/L   Potassium 4.3 3.5 - 5.1 mmol/L   Calcium, Ion 1.24 1.15 - 1.40 mmol/L   HCT 35.0 (L) 39.0 - 52.0 %   Hemoglobin 11.9 (L) 13.0 - 17.0 g/dL   Patient temperature 60.4 F    Sample type VENOUS   Urinalysis, Routine w reflex microscopic -Urine, Clean Catch     Status: Abnormal   Collection Time: 08/24/22  4:26 PM  Result Value Ref Range   Color, Urine YELLOW YELLOW   APPearance CLOUDY (A) CLEAR   Specific Gravity, Urine >=1.030 1.005 - 1.030   pH 6.0 5.0 - 8.0   Glucose, UA NEGATIVE NEGATIVE mg/dL   Hgb urine dipstick TRACE (A) NEGATIVE   Bilirubin Urine NEGATIVE NEGATIVE   Ketones, ur NEGATIVE NEGATIVE mg/dL   Protein, ur 30 (A) NEGATIVE mg/dL   Nitrite NEGATIVE NEGATIVE   Leukocytes,Ua LARGE (A) NEGATIVE    Comment: Performed at [facility], [address]  [place], [place] [address]  Urinalysis, Microscopic (reflex)     Status: Abnormal   Collection Time: 08/24/22  4:26 PM  Result Value Ref Range   RBC / HPF >50 0 - 5 RBC/hpf   WBC, UA >50 0 - 5 WBC/hpf   Bacteria, UA MANY (A) NONE SEEN   Squamous Epithelial / HPF 0-5 0 - 5 /HPF    Comment: Performed at [facility], [address] [place], [place] [address]   CT Head Wo Contrast  Result Date: 08/24/2022 CLINICAL DATA:  Provided history: Mental status change, unknown cause. EXAM: CT HEAD WITHOUT CONTRAST TECHNIQUE: Contiguous axial images were obtained from the base of the skull through the vertex without intravenous contrast. RADIATION DOSE REDUCTION: This exam was performed according to the departmental dose-optimization program which includes automated exposure control, adjustment of the mA and/or kV according to patient size and/or use of iterative reconstruction technique. COMPARISON:  Head CT 06/23/2022. FINDINGS: Brain: Generalized cerebral atrophy. Redemonstrated chronic lacunar infarcts within the left corona radiata/basal ganglia. There is no acute intracranial hemorrhage. No demarcated cortical infarct. No extra-axial fluid collection. No evidence of an intracranial mass. No midline shift. Vascular: No hyperdense vessel.  Atherosclerotic calcifications. Skull: No calvarial fracture or aggressive osseous lesion. Sinuses/Orbits: No mass or acute finding within the imaged orbits. Mild mucosal thickening within the bilateral ethmoid sinuses. Minimal mucosal thickening within the right maxillary sinus at the imaged levels. IMPRESSION: 1.  No evidence of an acute intracranial abnormality. 2. Chronic lacunar infarcts within the left corona radiata/basal ganglia, unchanged  from the prior head CT of 06/23/2022. 3. Generalized cerebral atrophy. 4. Mild paranasal sinus disease at the imaged levels, as described. Electronically Signed   By: Jackey Loge D.O.   On: 08/24/2022 15:57   DG Chest  Portable 1 View  Result Date: 08/24/2022 CLINICAL DATA:  confusion EXAM: PORTABLE CHEST 1 VIEW COMPARISON:  CXR 06/23/22 FINDINGS: Left-sided dual lead cardiac device with unchanged lead positioning. Cardiomegaly. Small bilateral pleural effusions. There are prominent bilateral interstitial opacities that likely represent mild pulmonary edema. There are more focal hazy airspace opacities in the bilateral lung bases that could represent superimposed atelectasis or infection. No radiographically apparent displaced rib fractures. Visualized upper abdomen is unremarkable. IMPRESSION: 1. Cardiomegaly with small bilateral pleural effusions and mild pulmonary edema. 2. Hazy airspace opacities in the bilateral lung bases could represent superimposed atelectasis or infection. Electronically Signed   By: Lorenza Cambridge M.D.   On: 08/24/2022 15:56    Pending Labs Unresulted Labs (From admission, onward)    None       Vitals/Pain Today's Vitals   08/24/22 1930 08/24/22 1945 08/24/22 2000 08/24/22 2015  BP: (!) 149/80 139/81 134/81 139/79  Pulse: 65 69 66 64  Resp:      Temp:      TempSrc:      SpO2: 95% 91% 93% 93%  Weight:      Height:      PainSc:        Isolation Precautions No active isolations  Medications Medications  cefTRIAXone (ROCEPHIN) 1 g in sodium chloride 0.9 % 100 mL IVPB (0 g Intravenous Stopped 08/24/22 1815)  doxycycline (VIBRAMYCIN) 100 mg in sodium chloride 0.9 % 250 mL IVPB (0 mg Intravenous Stopped 08/24/22 2024)  furosemide (LASIX) injection 20 mg (20 mg Intravenous Given 08/24/22 1732)    Mobility walks with device     Focused Assessments    R Recommendations: See Admitting Provider Note  Report given to: Toniann Fail, RN   Additional Notes:  Has on briefs but able to stand with assistance to urinate,

## 2022-08-24 NOTE — ED Notes (Addendum)
[facility] in route to get pt, ETA 5 min, family and pt made aware of POC and verbalized understanding

## 2022-08-24 NOTE — ED Provider Notes (Signed)
[place] [facility] Provider Note   CSN: [id number] Arrival date & time: 08/24/22  1343     History  Chief Complaint  Patient presents with   Altered Mental Status    Terry Gregory is a 89 y.o. male with a past medical history significant for BPH and history of pericardial effusion who presents to the ED due to altered mental status for the past 4 days.  Daughter at bedside provided history.  Daughter states patient has been drowsier than normal over the past 4 days.  Difficult to arouse in the morning.  He also has been taking longer than normal naps.  Daughter also notes that he has been drooling more than normal (from left side).  Appears confused upon waking up.  Daughter notes that patient has been clearing his throat more than normal.  No cough, runny nose, or fever.  No nausea, vomiting, or diarrhea.  Patient had a recent fall the end of June and has ecchymosis to the left side of his abdomen and chest.  Patient denies any pain.  No chest pain or shortness of breath.  No abdominal pain.  No urinary symptoms.  Daughter also notes patient's lower extremities have been more edematous than normal.  No history of CHF.  History obtained from patient, daughter, and past medical records. No interpreter used during encounter.        Home Medications Prior to Admission medications   Medication Sig Start Date End Date Taking? Authorizing Provider  aspirin EC 81 MG tablet Take 81 mg by mouth daily.    [provider]  finasteride (PROSCAR) 5 MG tablet Take 5 mg by mouth daily. 09/27/17   [provider]      Allergies    Erythromycin and Penicillins    Review of Systems   Review of Systems  Constitutional:  Positive for activity change and fatigue. Negative for chills and fever.  Respiratory:  Negative for shortness of breath.   Cardiovascular:  Positive for leg swelling. Negative for chest pain.  Gastrointestinal:  Negative for  abdominal pain.    Physical Exam Updated Vital Signs BP 106/66   Pulse (!) 59   Temp (!) 97.5 F (36.4 C) (Oral)   Resp 11   Ht 6\' 3"  (1.905 m)   Wt 95.3 kg   SpO2 90%   BMI 26.25 kg/m  Physical Exam Vitals and nursing note reviewed.  Constitutional:      General: He is not in acute distress.    Appearance: He is not ill-appearing.  HENT:     Head: Normocephalic.  Eyes:     Pupils: Pupils are equal, round, and reactive to light.  Cardiovascular:     Rate and Rhythm: Normal rate and regular rhythm.     Pulses: Normal pulses.     Heart sounds: Normal heart sounds. No murmur heard.    No friction rub. No gallop.  Pulmonary:     Effort: Pulmonary effort is normal.     Breath sounds: Normal breath sounds.  Abdominal:     General: Abdomen is flat. There is no distension.     Palpations: Abdomen is soft.     Tenderness: There is no abdominal tenderness. There is no guarding or rebound.  Musculoskeletal:        General: Normal range of motion.     Cervical back: Neck supple.  Skin:    General: Skin is warm and dry.  Neurological:  General: No focal deficit present.     Mental Status: He is alert.     Comments: Soft speech, able to follow commands CN III-XII intact Normal strength in upper and lower extremities bilaterally including dorsiflexion and plantar flexion, strong and equal grip strength Sensation grossly intact throughout Moves extremities without ataxia, coordination intact No pronator drift   Psychiatric:        Mood and Affect: Mood normal.        Behavior: Behavior normal.     ED Results / Procedures / Treatments   Labs (all labs ordered are listed, but only abnormal results are displayed) Labs Reviewed  COMPREHENSIVE METABOLIC PANEL - Abnormal; Notable for the following components:      Result Value   Sodium 134 (*)    Glucose, Bld 189 (*)    BUN 30 (*)    Calcium 8.8 (*)    Albumin 3.1 (*)    All other components within normal limits  CBC  WITH DIFFERENTIAL/PLATELET - Abnormal; Notable for the following components:   WBC 12.1 (*)    RBC 3.40 (*)    Hemoglobin 11.3 (*)    HCT 33.9 (*)    RDW 16.1 (*)    Neutro Abs 10.9 (*)    All other components within normal limits  URINALYSIS, ROUTINE W REFLEX MICROSCOPIC - Abnormal; Notable for the following components:   APPearance CLOUDY (*)    Hgb urine dipstick TRACE (*)    Protein, ur 30 (*)    Leukocytes,Ua LARGE (*)    All other components within normal limits  BRAIN NATRIURETIC PEPTIDE - Abnormal; Notable for the following components:   B Natriuretic Peptide 378.6 (*)    All other components within normal limits  URINALYSIS, MICROSCOPIC (REFLEX) - Abnormal; Notable for the following components:   Bacteria, UA MANY (*)    All other components within normal limits  CBG MONITORING, ED - Abnormal; Notable for the following components:   Glucose-Capillary 186 (*)    All other components within normal limits  I-STAT VENOUS BLOOD GAS, ED - Abnormal; Notable for the following components:   pCO2, Ven 43.5 (*)    HCT 35.0 (*)    Hemoglobin 11.9 (*)    All other components within normal limits  TROPONIN I (HIGH SENSITIVITY)    EKG EKG Interpretation Date/Time:  Friday August 24 2022 13:59:34 EDT Ventricular Rate:  66 PR Interval:    QRS Duration:  196 QT Interval:  465 QTC Calculation: 488 R Axis:   -70  Text Interpretation: VENTRICULAR PACED RHYTHM Left bundle branch block Interpretation limited secondary to artifact PVCs resolved otherwise no significant change from last EKG Confirmed by Elayne Snare ([id number]) on 08/24/2022 3:34:31 PM  Radiology CT Head Wo Contrast  Result Date: 08/24/2022 CLINICAL DATA:  Provided history: Mental status change, unknown cause. EXAM: CT HEAD WITHOUT CONTRAST TECHNIQUE: Contiguous axial images were obtained from the base of the skull through the vertex without intravenous contrast. RADIATION DOSE REDUCTION: This exam was performed according  to the departmental dose-optimization program which includes automated exposure control, adjustment of the mA and/or kV according to patient size and/or use of iterative reconstruction technique. COMPARISON:  Head CT 06/23/2022. FINDINGS: Brain: Generalized cerebral atrophy. Redemonstrated chronic lacunar infarcts within the left corona radiata/basal ganglia. There is no acute intracranial hemorrhage. No demarcated cortical infarct. No extra-axial fluid collection. No evidence of an intracranial mass. No midline shift. Vascular: No hyperdense vessel.  Atherosclerotic calcifications. Skull: No calvarial fracture or  aggressive osseous lesion. Sinuses/Orbits: No mass or acute finding within the imaged orbits. Mild mucosal thickening within the bilateral ethmoid sinuses. Minimal mucosal thickening within the right maxillary sinus at the imaged levels. IMPRESSION: 1.  No evidence of an acute intracranial abnormality. 2. Chronic lacunar infarcts within the left corona radiata/basal ganglia, unchanged from the prior head CT of 06/23/2022. 3. Generalized cerebral atrophy. 4. Mild paranasal sinus disease at the imaged levels, as described. Electronically Signed   By: Jackey Loge D.O.   On: 08/24/2022 15:57   DG Chest Portable 1 View  Result Date: 08/24/2022 CLINICAL DATA:  confusion EXAM: PORTABLE CHEST 1 VIEW COMPARISON:  CXR 06/23/22 FINDINGS: Left-sided dual lead cardiac device with unchanged lead positioning. Cardiomegaly. Small bilateral pleural effusions. There are prominent bilateral interstitial opacities that likely represent mild pulmonary edema. There are more focal hazy airspace opacities in the bilateral lung bases that could represent superimposed atelectasis or infection. No radiographically apparent displaced rib fractures. Visualized upper abdomen is unremarkable. IMPRESSION: 1. Cardiomegaly with small bilateral pleural effusions and mild pulmonary edema. 2. Hazy airspace opacities in the bilateral lung  bases could represent superimposed atelectasis or infection. Electronically Signed   By: Lorenza Cambridge M.D.   On: 08/24/2022 15:56    Procedures Procedures    Medications Ordered in ED Medications  cefTRIAXone (ROCEPHIN) 1 g in sodium chloride 0.9 % 100 mL IVPB (has no administration in time range)  doxycycline (VIBRAMYCIN) 100 mg in sodium chloride 0.9 % 250 mL IVPB (has no administration in time range)  furosemide (LASIX) injection 20 mg (has no administration in time range)    ED Course/ Medical Decision Making/ A&P                             Medical Decision Making Amount and/or Complexity of Data Reviewed Independent Historian: caregiver Labs: ordered. Decision-making details documented in ED Course. Radiology: ordered and independent interpretation performed. Decision-making details documented in ED Course. ECG/medicine tests: ordered and independent interpretation performed. Decision-making details documented in ED Course.  Risk Prescription drug management. Decision regarding hospitalization.   This patient presents to the ED for concern of AMS, this involves an extensive number of treatment options, and is a complaint that carries with it a high risk of complications and morbidity.  The differential diagnosis includes UTI, pneumonia, intracranial bleed, sepsis, etc  89 year old male presents to the ED due to altered mental status.  Daughter at bedside provided history.  Daughter notes patient has been confused and sleeping more than normal for the past 4 days.  She also notes patient has been having lower extremity edema.  No history of CHF.  Patient denies any pain.  Had a recent fall end of June.  Upon arrival patient afebrile, not tachycardic or hypoxic.  Patient in no acute distress.  Ecchymosis to left side of abdomen and chest.  No tenderness to ribs or abdomen, so will hold off on CT scans.  2+ pitting edema bilaterally.  Normal neurological exam without any  neurological deficits.  Routine labs ordered.  UA to rule out UTI.  CT head to rule out intracranial bleed or other acute abnormalities.  Pacemaker interrogated.  BNP to rule out onset CHF given lower extremity edema.  During initial evaluation patient's O2 saturation in the upper 80s with good waveform however patient appears to be sleeping.  Upon awakening patient's O2 saturation maintained in the mid 90s with good waveform.  No  history of sleep apnea. Possible undiagnosed?  CBC significant for leukocytosis at 12.1.  Hemoglobin 11.3.  CMP significant for hyperglycemia 189.  No anion gap.  Normal creatinine.  Mildly elevated.  Troponin normal.  Low suspicion for ACS.  VBG reassuring.  UA consistent with acute cystitis.  IV Rocephin started.  Chest x-ray personally reviewed and interpreted which demonstrates cardiomegaly with small bilateral pleural effusions and pulmonary edema.  Does also demonstrate hazy airspace opacities in bilateral lung bases could represent superimposed atelectasis versus infection.  Will add doxycycline to cover for pneumonia.  Lasix given as patient appears fluid overload on exam.  CT head negative for any acute abnormalities.  Does show old infarcts. Unable to obtain MRI [facility] at [facility], will defer further imaging to hospitialist during patient's admission.  Will discuss with hospitalist due to altered mental status likely multifactorial due to UTI and possible pneumonia.  5:59 PM Discussed with Dr. Ella Jubilee with [facility] who agrees to admit patient.   Discussed with Dr. Theresia Lo who evaluated patient at bedside and agrees with assessment and plan.   Lives with daughter Has PCP       Final Clinical Impression(s) / ED Diagnoses Final diagnoses:  Altered mental status, unspecified altered mental status type  Acute cystitis without hematuria    Rx / DC Orders ED Discharge Orders     None         Mannie Stabile, PA-C 08/24/22 1801    Rexford Maus,  DO 08/24/22 2345

## 2022-08-24 NOTE — ED Notes (Signed)
[employer] Pacemaker interrogated per order, sent per program

## 2022-08-24 NOTE — ED Triage Notes (Signed)
Patient has been sleeping more often since Monday. He has been having episodes of confusion. Family stated that his mouth is more drawn that normal and he is drooling over the last week.

## 2022-08-24 NOTE — ED Notes (Signed)
MD Theresia Lo states it is okay for pt to eat and drink. Will check with pt to see what he would like to have. Pt comfortable at this time. Urinals emptied. Abx completed and removed.

## 2022-08-24 NOTE — ED Notes (Signed)
[facility] called for trasnport

## 2022-08-25 ENCOUNTER — Inpatient Hospital Stay (HOSPITAL_COMMUNITY): Payer: Medicare Other

## 2022-08-25 ENCOUNTER — Encounter (HOSPITAL_COMMUNITY): Payer: Self-pay | Admitting: Internal Medicine

## 2022-08-25 DIAGNOSIS — I5031 Acute diastolic (congestive) heart failure: Secondary | ICD-10-CM | POA: Diagnosis not present

## 2022-08-25 DIAGNOSIS — N390 Urinary tract infection, site not specified: Secondary | ICD-10-CM | POA: Diagnosis present

## 2022-08-25 DIAGNOSIS — I5033 Acute on chronic diastolic (congestive) heart failure: Secondary | ICD-10-CM | POA: Diagnosis present

## 2022-08-25 DIAGNOSIS — G9341 Metabolic encephalopathy: Secondary | ICD-10-CM | POA: Diagnosis not present

## 2022-08-25 LAB — CBC
HCT: 32.5 % — ABNORMAL LOW (ref 39.0–52.0)
HCT: 31.6 % — ABNORMAL LOW (ref 39.0–52.0)
Hemoglobin: 10.8 g/dL — ABNORMAL LOW (ref 13.0–17.0)
Hemoglobin: 10.4 g/dL — ABNORMAL LOW (ref 13.0–17.0)
MCH: 33 pg (ref 26.0–34.0)
MCH: 32.6 pg (ref 26.0–34.0)
MCHC: 33.2 g/dL (ref 30.0–36.0)
MCHC: 32.9 g/dL (ref 30.0–36.0)
MCV: 99.4 fL (ref 80.0–100.0)
MCV: 99.1 fL (ref 80.0–100.0)
Platelets: 143 10*3/uL — ABNORMAL LOW (ref 150–400)
Platelets: 143 10*3/uL — ABNORMAL LOW (ref 150–400)
RBC: 3.27 MIL/uL — ABNORMAL LOW (ref 4.22–5.81)
RBC: 3.19 MIL/uL — ABNORMAL LOW (ref 4.22–5.81)
RDW: 16 % — ABNORMAL HIGH (ref 11.5–15.5)
RDW: 16.1 % — ABNORMAL HIGH (ref 11.5–15.5)
WBC: 5.7 10*3/uL (ref 4.0–10.5)
WBC: 4.8 10*3/uL (ref 4.0–10.5)
nRBC: 0 % (ref 0.0–0.2)
nRBC: 0 % (ref 0.0–0.2)

## 2022-08-25 LAB — BRAIN NATRIURETIC PEPTIDE: B Natriuretic Peptide: 374.9 pg/mL — ABNORMAL HIGH (ref 0.0–100.0)

## 2022-08-25 LAB — ECHOCARDIOGRAM COMPLETE
AR max vel: 2.04 cm2
AV Peak grad: 7.1 mmHg
Ao pk vel: 1.33 m/s
Area-P 1/2: 2.74 cm2
Calc EF: 42.2 %
Height: 75 in
S' Lateral: 4.1 cm
Single Plane A2C EF: 41.7 %
Single Plane A4C EF: 45.3 %
Weight: 3360 oz

## 2022-08-25 LAB — BASIC METABOLIC PANEL
Anion gap: 7 (ref 5–15)
BUN: 28 mg/dL — ABNORMAL HIGH (ref 8–23)
CO2: 26 mmol/L (ref 22–32)
Calcium: 8.7 mg/dL — ABNORMAL LOW (ref 8.9–10.3)
Chloride: 102 mmol/L (ref 98–111)
Creatinine, Ser: 0.99 mg/dL (ref 0.61–1.24)
GFR, Estimated: >60 mL/min (ref >60)
Glucose, Bld: 82 mg/dL (ref 70–99)
Potassium: 3.9 mmol/L (ref 3.5–5.1)
Sodium: 135 mmol/L (ref 135–145)

## 2022-08-25 LAB — PROCALCITONIN: Procalcitonin: <0.1 ng/mL

## 2022-08-25 LAB — MAGNESIUM
Magnesium: 1.8 mg/dL (ref 1.7–2.4)
Magnesium: 1.8 mg/dL (ref 1.7–2.4)

## 2022-08-25 LAB — C-REACTIVE PROTEIN: CRP: 11.8 mg/dL — ABNORMAL HIGH (ref <1.0)

## 2022-08-25 MED ORDER — HEPARIN SODIUM (PORCINE) 5000 UNIT/ML IJ SOLN
5000.0000 [IU] | Freq: Three times a day (TID) | INTRAMUSCULAR | Status: DC
  Administered 2022-08-25 – 2022-08-26 (×3): 5000 [IU] via SUBCUTANEOUS
  Filled 2022-08-25 (×3): qty 1

## 2022-08-25 MED ORDER — ACETAMINOPHEN 650 MG RE SUPP: 650.0000 mg | Freq: Four times a day (QID) | RECTAL | Status: DC | PRN

## 2022-08-25 MED ORDER — POTASSIUM CHLORIDE 20 MEQ PO PACK
40.0000 meq | PACK | Freq: Once | ORAL | Status: AC
  Administered 2022-08-25: 40 meq via ORAL
  Filled 2022-08-25: qty 2

## 2022-08-25 MED ORDER — POTASSIUM CHLORIDE CRYS ER 20 MEQ PO TBCR
40.0000 meq | EXTENDED_RELEASE_TABLET | Freq: Once | ORAL | Status: DC
  Filled 2022-08-25: qty 2

## 2022-08-25 MED ORDER — SODIUM CHLORIDE 0.9 % IV SOLN
1.0000 g | INTRAVENOUS | Status: DC
  Administered 2022-08-25 – 2022-08-26 (×2): 1 g via INTRAVENOUS
  Filled 2022-08-25 (×2): qty 10

## 2022-08-25 MED ORDER — METOPROLOL TARTRATE 12.5 MG HALF TABLET
12.5000 mg | ORAL_TABLET | Freq: Two times a day (BID) | ORAL | Status: DC
  Administered 2022-08-25 – 2022-08-26 (×3): 12.5 mg via ORAL
  Filled 2022-08-25 (×3): qty 1

## 2022-08-25 MED ORDER — FUROSEMIDE 10 MG/ML IJ SOLN: 40.0000 mg | Freq: Two times a day (BID) | INTRAMUSCULAR | Status: DC

## 2022-08-25 MED ORDER — SODIUM CHLORIDE 0.9 % IV SOLN
1.0000 g | INTRAVENOUS | Status: DC
  Filled 2022-08-25: qty 10

## 2022-08-25 MED ORDER — ACETAMINOPHEN 325 MG PO TABS: 650.0000 mg | ORAL_TABLET | Freq: Four times a day (QID) | ORAL | Status: DC | PRN

## 2022-08-25 MED ORDER — ASPIRIN 81 MG PO TBEC
81.0000 mg | DELAYED_RELEASE_TABLET | Freq: Every day | ORAL | Status: DC
  Administered 2022-08-25 – 2022-08-26 (×2): 81 mg via ORAL
  Filled 2022-08-25 (×2): qty 1

## 2022-08-25 MED ORDER — SENNOSIDES-DOCUSATE SODIUM 8.6-50 MG PO TABS: 1.0000 | ORAL_TABLET | Freq: Every evening | ORAL | Status: DC | PRN

## 2022-08-25 MED ORDER — PANTOPRAZOLE SODIUM 40 MG PO TBEC
40.0000 mg | DELAYED_RELEASE_TABLET | Freq: Every day | ORAL | Status: DC
  Administered 2022-08-25 – 2022-08-26 (×2): 40 mg via ORAL
  Filled 2022-08-25 (×2): qty 1

## 2022-08-25 MED ORDER — FUROSEMIDE 10 MG/ML IJ SOLN
20.0000 mg | Freq: Two times a day (BID) | INTRAMUSCULAR | Status: DC
  Administered 2022-08-25: 20 mg via INTRAVENOUS
  Filled 2022-08-25: qty 2

## 2022-08-25 MED ORDER — SODIUM CHLORIDE 0.9% FLUSH
3.0000 mL | Freq: Two times a day (BID) | INTRAVENOUS | Status: DC
  Administered 2022-08-25 – 2022-08-26 (×3): 3 mL via INTRAVENOUS

## 2022-08-25 MED ORDER — FINASTERIDE 5 MG PO TABS
5.0000 mg | ORAL_TABLET | Freq: Every day | ORAL | Status: DC
  Administered 2022-08-25 – 2022-08-26 (×2): 5 mg via ORAL
  Filled 2022-08-25 (×2): qty 1

## 2022-08-25 MED ORDER — FUROSEMIDE 10 MG/ML IJ SOLN
40.0000 mg | Freq: Two times a day (BID) | INTRAMUSCULAR | Status: AC
  Administered 2022-08-25 – 2022-08-26 (×2): 40 mg via INTRAVENOUS
  Filled 2022-08-25 (×2): qty 4

## 2022-08-25 NOTE — Procedures (Signed)
Modified Barium Swallow Study  Patient Details  Name: Terry Gregory MRN: [id number] Date of Birth: [birth year]  Today's Date: 08/25/2022  Modified Barium Swallow completed.  Full report located under Chart Review in the Imaging Section.   Clinical Impression   MBS was completed in the lateral projection with barium impregnated thin liquids via spoon and cup, mildly and moderately thick liquids via cup, pureed material and dual textured solids.  He presented with an oral and pharyngeal dysphagia.  In addition, he likely has an esophageal dysphagia as sweep revealed it slow to clear with retrograde flow back into the pyriform sinuses.   The patient was not aware he is having swallowing issues.  Oral deficits were noted for lingual control, lingual motion and bolus prep/mastication.  These deficits led to a delay in A/P transport, premature spillage and oral residue.  Thin liquids and moderately thick liquids were noted to resonate in the pyriform sinsues/laryngeal vestibule with all other textures resonating in the vallecula prior to the swallow trigger.  Pharyngeal deficits were noted for laryngeal elevation, anterior hyoid excursion, laryngeal vestibule closure, pharyngeal stripping wave and base of tongue retraction.  These deficits led to vallecula and pyriform sinsus residue that he did not appear to be sensate to.  Some of the pyriform sinus residue was due to backflow from the esophagus.  Residue occured across all textures but got worse as the material got thicker.  Penetration into the laryngael vestibule was seen during the swallow given thin liquids.  At some point while fluoroscopy was turned off aspiration occurred following thin liquids trials.  Coating was seen on the tracheal wall when fluoroscopy was turned back on.  Cued cough and reswallow helped to clear material in the vestibule but not from the airway.  Penetration to the level of the vocal folds was seen prior to the swallow with  aspiration occuring during the swallow given mildly thick liquids.  Spontaneous cough was seen in response to this aspiration event.  Penetration to the level of the vocal folds was seen after the swallow given moderately thick liquids.  Again cued cough was helpful to clear material from the laryngeal vestibule but not the airway.  No penetration/aspiration was seen given pureed material or dual textured solids.   There is concern for backflow though over the course of a meal leading to penetration/aspiration given pureed material and dual textured solids.  Discussion with the patient's daughter Larene Beach took place via phone and she'd like him to be able to continue to eat/drink.  Discussed risks of aspiration including aspiration PNA and educated on ways to mitigate that risk (ie oral care 2-3 times per day using a toothbrush and toothpaste).  Given this suggest a dysphagia 2 diet with thin liquids.  Please note patient will still penetrate/aspirate on this diet.  ST will follow to initiate swallowing therapy and patient/family education.  He would benefit from ongoing therapy at the next level of care.    Please see long note in imaging tab for more information regarding swallowing physiology.  Factors that may increase risk of adverse event in presence of aspiration Rubye Oaks & Clearance Coots 2021): Poor general health and/or compromised immunity;Reduced cognitive function;Limited mobility;Frail or deconditioned;Dependence for feeding and/or oral hygiene;Frequent aspiration of large volumes  Swallow Evaluation Recommendations Recommendations: PO diet PO Diet Recommendation:  (Given discussion with the patient's daughter decision was made to begin him on dysphagia 2 diet with thin liquids.) Medication Administration: Crushed with puree Supervision: Staff to assist with self-feeding  Swallowing strategies  : Minimize environmental distractions;Check for pocketing or oral holding;Follow solids with liquids Postural  changes: Stay upright 30-60 min after meals;Position pt fully upright for meals Oral care recommendations: Oral care QID (4x/day)     Dimas Aguas, MA, CCC-SLP Acute Rehab SLP [phone]  Fleet Contras 08/25/2022,1:14 PM

## 2022-08-25 NOTE — H&P (Addendum)
History and Physical    Terry Gregory JWJ:[id number] DOB: 1921-10-19 DOA: 08/24/2022  PCP: Geoffry Paradise, MD   Patient coming from: Home   Chief Complaint: Somnolence, confusion   HPI: Terry Gregory is a 89 y.o. male with medical history significant for symptomatic bradycardia with pacemaker, BPH, and atrial flutter not anticoagulated, who presented to the emergency department for evaluation of somnolence and confusion.  Patient's daughter notes that the patient has been sleeping more than usual during the daytime since 08/20/2022.  He has also had episodes of confusion.  Family also notes that patient has been drooling some for the past 2 weeks.  Patient himself has no complaints, feels that he is doing well, and does not understand why he is in the hospital.  [facility] ED Course: Upon arrival to the ED, patient is found to be afebrile and saturating low 90s on room air with normal heart rate and stable blood pressure.  EKG demonstrates atrial fibrillation with PVCs and LBBB.  Head CT is negative for acute intracranial abnormality and chest x-ray notable for cardiomegaly with small bilateral pleural effusions and mild pulmonary edema.  Labs are most notable for normal troponin, BNP 379, WBC 12,100, and BUN 30.  There is bacteriuria and pyuria on UA.  Patient was treated with antibiotics and IV Lasix in the ED.  He was transferred to [facility] for admission.  Review of Systems:  All other systems reviewed and apart from HPI, are negative.  Past Medical History:  Diagnosis Date   BPH (benign prostatic hyperplasia)    Diverticulosis    Pericardial effusion     Past Surgical History:  Procedure Laterality Date   APPENDECTOMY  1960   CATARACT EXTRACTION  2009   both eyes   CHOLECYSTECTOMY  2000   ERCP N/A 06/24/2020   Procedure: ENDOSCOPIC RETROGRADE CHOLANGIOPANCREATOGRAPHY (ERCP);  Surgeon: Kerin Salen, MD;  Location: [facility] ENDOSCOPY;  Service: Gastroenterology;  Laterality: N/A;    INGUINAL HERNIA REPAIR  1980's   PACEMAKER IMPLANT N/A 03/21/2020   Procedure: PACEMAKER IMPLANT;  Surgeon: Thurmon Fair, MD;  Location: [facility] INVASIVE CV LAB;  Service: Cardiovascular;  Laterality: N/A;   REMOVAL OF STONES  06/24/2020   Procedure: REMOVAL OF STONES;  Surgeon: Kerin Salen, MD;  Location: [facility] ENDOSCOPY;  Service: Gastroenterology;;   Dennison Mascot  06/24/2020   Procedure: Dennison Mascot;  Surgeon: Kerin Salen, MD;  Location: [facility] ENDOSCOPY;  Service: Gastroenterology;;   STONE EXTRACTION WITH BASKET  06/24/2020   Procedure: STONE EXTRACTION WITH BASKET;  Surgeon: Kerin Salen, MD;  Location: [facility] ENDOSCOPY;  Service: Gastroenterology;;  Basket crushed stones   TOTAL HIP ARTHROPLASTY  2003   right   WRIST SURGERY  2009   left    Social History:   reports that he has never smoked. He has never used smokeless tobacco. He reports that he does not drink alcohol and does not use drugs.  Allergies  Allergen Reactions   Erythromycin Hives and Rash   Penicillins Hives and Rash    .Marland KitchenHas patient had a PCN reaction causing immediate rash, facial/tongue/throat swelling, SOB or lightheadedness with hypotension: Yes Has patient had a PCN reaction causing severe rash involving mucus membranes or skin necrosis: No Has patient had a PCN reaction that required hospitalization No Has patient had a PCN reaction occurring within the last 10 years: No If all of the above answers are "NO", then may proceed with Cephalosporin use.     Family History  Problem Relation Age of Onset  Heart attack Father    Stroke Mother    Heart failure Brother    Stroke Brother    Diabetes Sister    Cancer Sister      Prior to Admission medications   Medication Sig Start Date End Date Taking? Authorizing Provider  aspirin EC 81 MG tablet Take 81 mg by mouth daily.   Yes [provider]  finasteride (PROSCAR) 5 MG tablet Take 5 mg by mouth daily. 09/27/17  Yes [provider]  omeprazole  (PRILOSEC) 20 MG capsule Take 20 mg by mouth daily.   Yes [provider]    Physical Exam: Vitals:   08/24/22 2100 08/24/22 2200 08/24/22 2300 08/25/22 0036  BP: 135/83 139/81  126/73  Pulse:  69 60   Resp:  18 12   Temp:  98.9 F (37.2 C)    TempSrc:  Oral    SpO2:  93% 92%   Weight:      Height:        Constitutional: NAD, no pallor or diaphoresis   Eyes: PERTLA, lids and conjunctivae normal ENMT: Mucous membranes are moist. Posterior pharynx clear of any exudate or lesions.   Neck: supple, no masses  Respiratory: no wheezing. No accessory muscle use.  Cardiovascular: S1 & S2 heard, regular rate and rhythm. Pitting edema involving b/l LEs. Abdomen: No distension, no tenderness, soft. Bowel sounds active.  Musculoskeletal: no clubbing / cyanosis. No joint deformity upper and lower extremities.   Skin: no significant rashes, lesions, ulcers. Warm, dry, well-perfused. Neurologic: Gross hearing deficit, CN 2-12 grossly intact otherwise. Sensation to light touch intact. Strength 5/5 in all 4 limbs. Alert and oriented to person only.  Psychiatric: Calm. Cooperative.    Labs and Imaging on Admission: I have personally reviewed following labs and imaging studies  CBC: Recent Labs  Lab 08/24/22 1403 08/24/22 1614  WBC 12.1*  --   NEUTROABS 10.9*  --   HGB 11.3* 11.9*  HCT 33.9* 35.0*  MCV 99.7  --   PLT 157  --    Basic Metabolic Panel: Recent Labs  Lab 08/24/22 1403 08/24/22 1614  NA 134* 136  K 4.2 4.3  CL 101  --   CO2 23  --   GLUCOSE 189*  --   BUN 30*  --   CREATININE 0.99  --   CALCIUM 8.8*  --    GFR: Estimated Creatinine Clearance: 48.6 mL/min (by C-G formula based on SCr of 0.99 mg/dL). Liver Function Tests: Recent Labs  Lab 08/24/22 1403  AST 25  ALT 18  ALKPHOS 86  BILITOT 0.6  PROT 6.7  ALBUMIN 3.1*   No results for input(s): "LIPASE", "AMYLASE" in the last 168 hours. No results for input(s): "AMMONIA" in the last 168  hours. Coagulation Profile: No results for input(s): "INR", "PROTIME" in the last 168 hours. Cardiac Enzymes: No results for input(s): "CKTOTAL", "CKMB", "CKMBINDEX", "TROPONINI" in the last 168 hours. BNP (last 3 results) No results for input(s): "PROBNP" in the last 8760 hours. HbA1C: No results for input(s): "HGBA1C" in the last 72 hours. CBG: Recent Labs  Lab 08/24/22 1401  GLUCAP 186*   Lipid Profile: No results for input(s): "CHOL", "HDL", "LDLCALC", "TRIG", "CHOLHDL", "LDLDIRECT" in the last 72 hours. Thyroid Function Tests: No results for input(s): "TSH", "T4TOTAL", "FREET4", "T3FREE", "THYROIDAB" in the last 72 hours. Anemia Panel: No results for input(s): "VITAMINB12", "FOLATE", "FERRITIN", "TIBC", "IRON", "RETICCTPCT" in the last 72 hours. Urine analysis:    Component Value  Date/Time   COLORURINE YELLOW 08/24/2022 1626   APPEARANCEUR CLOUDY (A) 08/24/2022 1626   LABSPEC >=1.030 08/24/2022 1626   PHURINE 6.0 08/24/2022 1626   GLUCOSEU NEGATIVE 08/24/2022 1626   HGBUR TRACE (A) 08/24/2022 1626   BILIRUBINUR NEGATIVE 08/24/2022 1626   KETONESUR NEGATIVE 08/24/2022 1626   PROTEINUR 30 (A) 08/24/2022 1626   UROBILINOGEN 0.2 10/27/2007 1029   NITRITE NEGATIVE 08/24/2022 1626   LEUKOCYTESUR LARGE (A) 08/24/2022 1626   Sepsis Labs: @LABRCNTIP (procalcitonin:4,lacticidven:4) )No results found for this or any previous visit (from the past 240 hour(s)).   Radiological Exams on Admission: CT Head Wo Contrast  Result Date: 08/24/2022 CLINICAL DATA:  Provided history: Mental status change, unknown cause. EXAM: CT HEAD WITHOUT CONTRAST TECHNIQUE: Contiguous axial images were obtained from the base of the skull through the vertex without intravenous contrast. RADIATION DOSE REDUCTION: This exam was performed according to the departmental dose-optimization program which includes automated exposure control, adjustment of the mA and/or kV according to patient size and/or use of  iterative reconstruction technique. COMPARISON:  Head CT 06/23/2022. FINDINGS: Brain: Generalized cerebral atrophy. Redemonstrated chronic lacunar infarcts within the left corona radiata/basal ganglia. There is no acute intracranial hemorrhage. No demarcated cortical infarct. No extra-axial fluid collection. No evidence of an intracranial mass. No midline shift. Vascular: No hyperdense vessel.  Atherosclerotic calcifications. Skull: No calvarial fracture or aggressive osseous lesion. Sinuses/Orbits: No mass or acute finding within the imaged orbits. Mild mucosal thickening within the bilateral ethmoid sinuses. Minimal mucosal thickening within the right maxillary sinus at the imaged levels. IMPRESSION: 1.  No evidence of an acute intracranial abnormality. 2. Chronic lacunar infarcts within the left corona radiata/basal ganglia, unchanged from the prior head CT of 06/23/2022. 3. Generalized cerebral atrophy. 4. Mild paranasal sinus disease at the imaged levels, as described. Electronically Signed   By: Jackey Loge D.O.   On: 08/24/2022 15:57   DG Chest Portable 1 View  Result Date: 08/24/2022 CLINICAL DATA:  confusion EXAM: PORTABLE CHEST 1 VIEW COMPARISON:  CXR 06/23/22 FINDINGS: Left-sided dual lead cardiac device with unchanged lead positioning. Cardiomegaly. Small bilateral pleural effusions. There are prominent bilateral interstitial opacities that likely represent mild pulmonary edema. There are more focal hazy airspace opacities in the bilateral lung bases that could represent superimposed atelectasis or infection. No radiographically apparent displaced rib fractures. Visualized upper abdomen is unremarkable. IMPRESSION: 1. Cardiomegaly with small bilateral pleural effusions and mild pulmonary edema. 2. Hazy airspace opacities in the bilateral lung bases could represent superimposed atelectasis or infection. Electronically Signed   By: Lorenza Cambridge M.D.   On: 08/24/2022 15:56    EKG: Independently  reviewed. Atrial fibrillation, PVCs, LBBB.   Assessment/Plan   1. Acute encephalopathy  - Presents with increased somnolence and intermittent confusion  - No acute findings on head CT  - Likely d/t UTI and/or acute CHF  - Treat UTI and CHF as below and expand workup if fails to improve as expected    2. Acute CHF  - EF was preserved on TTE from January 2021   - Presents with peripheral edema and noted to elevated BNP and have mild edema and pleural effusions on CXR  - Given 20 mg IV Lasix in ED  - Continue diuresis with IV Lasix, update echocardiogram, monitor weight and I/Os    3. UTI  - Not septic on admission  - Culture urine, continue Rocephin    4. Atrial fibrillation/flutter  - Followed by cardiology and not anticoagulated d/t bleeding risk  -  Continue ASA   DVT prophylaxis: sq heparin  Code Status: Full  Level of Care: Level of care: Telemetry Cardiac Family Communication: Daughter updated from ED  Disposition Plan:  Patient is from: Home  Anticipated d/c is to: TBD Anticipated d/c date is: 08/28/22  Patient currently: Pending echocardiogram, urine culture, improved/stable mental status  Consults called: None Admission status: Inpatient     Briscoe Deutscher, MD [facility]  08/25/2022, 2:27 AM

## 2022-08-25 NOTE — Plan of Care (Signed)

## 2022-08-25 NOTE — Progress Notes (Signed)
Echocardiogram 2D Echocardiogram has been performed.  Terry Gregory 08/25/2022, 3:59 PM

## 2022-08-25 NOTE — Progress Notes (Addendum)
PROGRESS NOTE                                                                                                                                                                                                             Patient Demographics:    Terry Gregory, is a 89 y.o. male, DOB - [birth year], ZOX:[id number]  Outpatient Primary MD for the patient is Geoffry Paradise, MD    LOS - 1  Admit date - 08/24/2022    Chief Complaint  Patient presents with   Altered Mental Status       Brief Narrative (HPI from H&P)   Koller is a 89 y.o. male with medical history significant for symptomatic bradycardia with pacemaker, BPH, and atrial flutter not anticoagulated, who presented to the emergency department for evaluation of somnolence and confusion consistent with UTI and possible acute on chronic diastolic CHF.   Subjective:    [name] today has, No headache, No chest pain, No abdominal pain - No Nausea, No new weakness tingling or numbness, no SOB   Assessment  & Plan :    1. Acute encephalopathy  - Presents with increased somnolence and intermittent confusion  - No acute findings on head CT  - Likely d/t UTI and/or acute CHF  - Treat UTI and CHF as below and expand workup if fails to improve as expected     2. Acute CHF on chronic diastolic CHF last EF 60% in 2022 -Echo pending -Continue diuresis with Lasix, low-dose beta-blocker and monitor   3. UTI  - Not septic on admission  - Culture urine, continue Rocephin, continue home alpha-blocker monitor postvoid residual bladder scans.   4. Atrial fibrillation/flutter  - Followed by cardiology and not anticoagulated d/t bleeding risk  - Continue ASA, low-dose beta-blocker and monitor  5.  Mild dysphagia - speech is following.      Condition - Extremely Guarded  Family Communication  : Called and left a detailed message for daughter Larene Beach [phone]  on 08/25/2022  at 8:40 AM  Code Status :  Full  Consults  :  None  PUD Prophylaxis :    Procedures  :     CT head no acute intracranial abnormality chronic lacunar infarcts.      Disposition Plan  :    Status is: Inpatient  DVT Prophylaxis  :  heparin injection 5,000 Units Start: 08/25/22 1400    Lab Results  Component Value Date   PLT 143 (L) 08/25/2022    Diet :  Diet Order             Diet NPO time specified Except for: Other (See Comments)  Diet effective now                    Inpatient Medications  Scheduled Meds:  aspirin EC  81 mg Oral Daily   finasteride  5 mg Oral Daily   furosemide  20 mg Intravenous BID   heparin  5,000 Units Subcutaneous Q8H   pantoprazole  40 mg Oral Daily   sodium chloride flush  3 mL Intravenous Q12H   Continuous Infusions:  cefTRIAXone (ROCEPHIN)  IV     PRN Meds:.acetaminophen **OR** acetaminophen, senna-docusate   Objective:   Vitals:   08/25/22 0400 08/25/22 0500 08/25/22 0600 08/25/22 0700  BP: 115/65     Pulse: (!) 59 60 60 60  Resp: 11 12 12 13   Temp: (!) 96.4 F (35.8 C)     TempSrc: Axillary     SpO2: 91% (!) 88% 92% 94%  Weight:      Height:        Wt Readings from Last 3 Encounters:  08/24/22 95.3 kg  06/23/22 95.3 kg  05/22/21 94.3 kg     Intake/Output Summary (Last 24 hours) at 08/25/2022 0849 Last data filed at 08/24/2022 2024 Gross per 24 hour  Intake 350 ml  Output --  Net 350 ml     Physical Exam  Awake, oriented x 1, minimally confused, No new F.N deficits,   [place].AT,PERRAL Supple Neck, No JVD,   Symmetrical Chest wall movement, Good air movement bilaterally, CTAB RRR,No Gallops,Rubs or new Murmurs,  +ve B.Sounds, Abd Soft, No tenderness,   1+ edema        Data Review:    Recent Labs  Lab 08/24/22 1403 08/24/22 1614 08/25/22 0544 08/25/22 0715  WBC 12.1*  --  5.7 4.8  HGB 11.3* 11.9* 10.8* 10.4*  HCT 33.9* 35.0* 32.5* 31.6*  PLT 157  --  143* 143*  MCV 99.7  --  99.4 99.1   MCH 33.2  --  33.0 32.6  MCHC 33.3  --  33.2 32.9  RDW 16.1*  --  16.0* 16.1*  LYMPHSABS 0.8  --   --   --   MONOABS 0.3  --   --   --   EOSABS 0.0  --   --   --   BASOSABS 0.0  --   --   --     Recent Labs  Lab 08/24/22 1403 08/24/22 1614 08/25/22 0544 08/25/22 0715  NA 134* 136 135  --   K 4.2 4.3 3.9  --   CL 101  --  102  --   CO2 23  --  26  --   ANIONGAP 10  --  7  --   GLUCOSE 189*  --  82  --   BUN 30*  --  28*  --   CREATININE 0.99  --  0.99  --   AST 25  --   --   --   ALT 18  --   --   --   ALKPHOS 86  --   --   --   BILITOT 0.6  --   --   --   ALBUMIN 3.1*  --   --   --  CRP  --   --   --  11.8*  BNP 378.6*  --   --  374.9*  MG  --   --  1.8 1.8  CALCIUM 8.8*  --  8.7*  --       Recent Labs  Lab 08/24/22 1403 08/25/22 0544 08/25/22 0715  CRP  --   --  11.8*  BNP 378.6*  --  374.9*  MG  --  1.8 1.8  CALCIUM 8.8* 8.7*  --       Radiology Reports DG Chest Port 1 View  Result Date: 08/25/2022 CLINICAL DATA:  89 year old male with history of shortness of breath. EXAM: PORTABLE CHEST 1 VIEW COMPARISON:  Chest x-ray 08/24/2022. FINDINGS: Lung volumes are low. Bibasilar opacities may reflect areas of atelectasis and/or consolidation. There is cephalization of the pulmonary vasculature and slight indistinctness of the interstitial markings suggestive of mild pulmonary edema. Small bilateral pleural effusions. Mild cardiomegaly. The patient is rotated to the right on today's exam, resulting in distortion of the mediastinal contours and reduced diagnostic sensitivity and specificity for mediastinal pathology. Atherosclerotic calcifications in the thoracic aorta. Left-sided pacemaker device in place with lead tips projecting over the expected location of the right atrium and right ventricle. IMPRESSION: 1. The appearance of the chest is once again suggestive of congestive heart failure, as above. 2. Additional bibasilar opacities may reflect areas of atelectasis  and/or consolidation, with superimposed small bilateral pleural effusions. 3. Aortic atherosclerosis. Electronically Signed   By: Trudie Reed M.D.   On: 08/25/2022 08:41   CT Head Wo Contrast  Result Date: 08/24/2022 CLINICAL DATA:  Provided history: Mental status change, unknown cause. EXAM: CT HEAD WITHOUT CONTRAST TECHNIQUE: Contiguous axial images were obtained from the base of the skull through the vertex without intravenous contrast. RADIATION DOSE REDUCTION: This exam was performed according to the departmental dose-optimization program which includes automated exposure control, adjustment of the mA and/or kV according to patient size and/or use of iterative reconstruction technique. COMPARISON:  Head CT 06/23/2022. FINDINGS: Brain: Generalized cerebral atrophy. Redemonstrated chronic lacunar infarcts within the left corona radiata/basal ganglia. There is no acute intracranial hemorrhage. No demarcated cortical infarct. No extra-axial fluid collection. No evidence of an intracranial mass. No midline shift. Vascular: No hyperdense vessel.  Atherosclerotic calcifications. Skull: No calvarial fracture or aggressive osseous lesion. Sinuses/Orbits: No mass or acute finding within the imaged orbits. Mild mucosal thickening within the bilateral ethmoid sinuses. Minimal mucosal thickening within the right maxillary sinus at the imaged levels. IMPRESSION: 1.  No evidence of an acute intracranial abnormality. 2. Chronic lacunar infarcts within the left corona radiata/basal ganglia, unchanged from the prior head CT of 06/23/2022. 3. Generalized cerebral atrophy. 4. Mild paranasal sinus disease at the imaged levels, as described. Electronically Signed   By: Jackey Loge D.O.   On: 08/24/2022 15:57   DG Chest Portable 1 View  Result Date: 08/24/2022 CLINICAL DATA:  confusion EXAM: PORTABLE CHEST 1 VIEW COMPARISON:  CXR 06/23/22 FINDINGS: Left-sided dual lead cardiac device with unchanged lead positioning.  Cardiomegaly. Small bilateral pleural effusions. There are prominent bilateral interstitial opacities that likely represent mild pulmonary edema. There are more focal hazy airspace opacities in the bilateral lung bases that could represent superimposed atelectasis or infection. No radiographically apparent displaced rib fractures. Visualized upper abdomen is unremarkable. IMPRESSION: 1. Cardiomegaly with small bilateral pleural effusions and mild pulmonary edema. 2. Hazy airspace opacities in the bilateral lung bases could represent superimposed atelectasis or infection. Electronically Signed  By: Lorenza Cambridge M.D.   On: 08/24/2022 15:56      Signature  -   Susa Raring M.D on 08/25/2022 at 8:49 AM   -  To page go to [email or web address]

## 2022-08-25 NOTE — Evaluation (Signed)
Clinical/Bedside Swallow Evaluation Patient Details  Name: Terry Gregory MRN: [id number] Date of Birth: May 24, 1921  Today's Date: 08/25/2022 Time: SLP Start Time (ACUTE ONLY): 0758 SLP Stop Time (ACUTE ONLY): 0815 SLP Time Calculation (min) (ACUTE ONLY): 17 min  Past Medical History:  Past Medical History:  Diagnosis Date   BPH (benign prostatic hyperplasia)    Diverticulosis    Pericardial effusion    Past Surgical History:  Past Surgical History:  Procedure Laterality Date   APPENDECTOMY  1960   CATARACT EXTRACTION  2009   both eyes   CHOLECYSTECTOMY  2000   ERCP N/A 06/24/2020   Procedure: ENDOSCOPIC RETROGRADE CHOLANGIOPANCREATOGRAPHY (ERCP);  Surgeon: Kerin Salen, MD;  Location: [facility] ENDOSCOPY;  Service: Gastroenterology;  Laterality: N/A;   INGUINAL HERNIA REPAIR  1980's   PACEMAKER IMPLANT N/A 03/21/2020   Procedure: PACEMAKER IMPLANT;  Surgeon: Thurmon Fair, MD;  Location: [facility] INVASIVE CV LAB;  Service: Cardiovascular;  Laterality: N/A;   REMOVAL OF STONES  06/24/2020   Procedure: REMOVAL OF STONES;  Surgeon: Kerin Salen, MD;  Location: [facility] ENDOSCOPY;  Service: Gastroenterology;;   Dennison Mascot  06/24/2020   Procedure: Dennison Mascot;  Surgeon: Kerin Salen, MD;  Location: [facility] ENDOSCOPY;  Service: Gastroenterology;;   STONE EXTRACTION WITH BASKET  06/24/2020   Procedure: STONE EXTRACTION WITH BASKET;  Surgeon: Kerin Salen, MD;  Location: [facility] ENDOSCOPY;  Service: Gastroenterology;;  Basket crushed stones   TOTAL HIP ARTHROPLASTY  2003   right   WRIST SURGERY  2009   left   HPI:  Terry Gregory is a 89 y.o. male with medical history significant for symptomatic bradycardia with pacemaker, BPH, and atrial flutter not anticoagulated, who presented to the emergency department for evaluation of somnolence and confusion.  Patient's daughter notes that the patient has been sleeping more than usual during the daytime since 08/20/2022.  He has also had episodes of confusion.  Family also notes  that patient has been drooling some for the past 2 weeks.  Patient himself has no complaints, feels that he is doing well, and does not understand why he is in the hospital.  He was diagnosed with acute encephalopathy, acute CHF, UTI and afib/flutter.  CT of the head was showing no evidence of an acute intracranial abnormality with chronic lacunar infarcts in the left corona radiata/basal ganglia.  Chest xray was showing small bilateral pleural effusions and hazy airspace opacities superimposed atelectasis or infection.    Assessment / Plan / Recommendation  Clinical Impression  Clinical swallowing evaluation was completed using thin liquids via spoon, cup and straw, pureed material and dry solids.  RN approved patient for PO intake.   Per orders he is on a regular diet.  The patient did not endorse any trouble swallowing prior to admission.  Cranial nerve exam was completed and remarkable for a facial droop on the left and mildly reduced labial strength. Otherwise, lingual and jaw range of motion and strength appeared to be adequate.  Facial sensation was unable to be reliably assessed.  He presented with a possible pharyngeal dysphagia.  Strong cough response with wet, gurgly vocal quality was noted on self fed cup and straw sips of thin liquids.  MBS will be completed to fully assess swallowing physiology and safety and to make least restrictive diet recommendation.  Patient may take medication whole in pureed material pending results of this evaluation. SLP Visit Diagnosis: Dysphagia, unspecified (R13.10)    Aspiration Risk  Severe aspiration risk;Moderate aspiration risk    Diet Recommendation  NPO pending MBS  Medication Administration: Crushed with puree    Other  Recommendations Oral Care Recommendations: Oral care BID    Recommendations for follow up therapy are one component of a multi-disciplinary discharge planning process, led by the attending physician.  Recommendations may be updated  based on patient status, additional functional criteria and insurance authorization.  Follow up Recommendations  (TBD)         Functional Status Assessment Patient has had a recent decline in their functional status and demonstrates the ability to make significant improvements in function in a reasonable and predictable amount of time.    Swallow Study   General Date of Onset: 08/24/22 HPI: Terry Gregory is a 89 y.o. male with medical history significant for symptomatic bradycardia with pacemaker, BPH, and atrial flutter not anticoagulated, who presented to the emergency department for evaluation of somnolence and confusion.  Patient's daughter notes that the patient has been sleeping more than usual during the daytime since 08/20/2022.  He has also had episodes of confusion.  Family also notes that patient has been drooling some for the past 2 weeks.  Patient himself has no complaints, feels that he is doing well, and does not understand why he is in the hospital.  He was diagnosed with acute encephalopathy, acute CHF, UTI and afib/flutter.  CT of the head was showing no evidence of an acute intracranial abnormality with chronic lacunar infarcts in the left corono radiata/basal ganglia.  Chest xray was showing small bilateral pleural effusions and hazy airspace opacities superimposed atelectasis or infection. Type of Study: Bedside Swallow Evaluation Previous Swallow Assessment: None noted at [facility]. Diet Prior to this Study: Regular;Thin liquids (Level 0) Temperature Spikes Noted: No Respiratory Status: Room air History of Recent Intubation: No Behavior/Cognition: Alert;Cooperative;Requires cueing Oral Cavity Assessment: Within Functional Limits Oral Care Completed by SLP: No Oral Cavity - Dentition: Adequate natural dentition Vision: Functional for self-feeding Self-Feeding Abilities: Needs assist Patient Positioning: Upright in bed Baseline Vocal Quality: Normal Volitional Swallow: Able to  elicit    Oral/Motor/Sensory Function Overall Oral Motor/Sensory Function: Mild impairment Facial ROM: Reduced left Facial Symmetry: Abnormal symmetry left Facial Strength: Reduced left Facial Sensation:  (Unable to assess) Lingual ROM: Within Functional Limits Lingual Symmetry: Within Functional Limits Lingual Strength: Within Functional Limits Mandible: Within Functional Limits   Ice Chips Ice chips: Not tested   Thin Liquid Thin Liquid: Impaired Presentation: Spoon;Cup;Straw Pharyngeal  Phase Impairments: Cough - Immediate (given cup and small straw sips)    Nectar Thick Nectar Thick Liquid: Not tested   Honey Thick Honey Thick Liquid: Not tested   Puree Puree: Not tested Presentation: Spoon   Solid     Solid: Within functional limits Presentation: Self Fed      Dimas Aguas, MA, CCC-SLP Acute Rehab SLP [phone]  Fleet Contras 08/25/2022,8:26 AM

## 2022-08-25 NOTE — Evaluation (Addendum)
Physical Therapy Evaluation Patient Details Name: Terry Gregory MRN: [id number] DOB: 05-28-1921 Today's Date: 08/25/2022  History of Present Illness  Pt is a 89 y.o. male brought to the ED for evaluation of somnolence and confusion.  W/U reveals acute enchephalopathy likely due to UTI or acute CHF.  PMH significant for symptomatic bradycardia with pacemaker, BPH, and atrial flutter not anticoagulated,  Clinical Impression  Patient presents with dependencies in gait and mobility due to decreased balance and generalized weakness.  Per daughter, patient has been declining physically over the past few months, requiring more assistance and falling more often.    Pt will benefit from PT in the hospital and after discharge to continue to address generalized weakness, balance and progressive mobility.  Family desires to take patient back home and I feel this is appropriate.        Assistance Recommended at Discharge Frequent or constant Supervision/Assistance  If plan is discharge home, recommend the following:  Can travel by private vehicle  A lot of help with bathing/dressing/bathroom;A lot of help with walking and/or transfers;Assistance with cooking/housework;Assist for transportation;Help with stairs or ramp for entrance        Equipment Recommendations None recommended by PT  Recommendations for Other Services       Functional Status Assessment Patient has had a recent decline in their functional status and demonstrates the ability to make significant improvements in function in a reasonable and predictable amount of time.     Precautions / Restrictions Precautions Precautions: Fall      Mobility  Bed Mobility Overal bed mobility: Needs Assistance Bed Mobility: Supine to Sit, Sit to Supine     Supine to sit: Mod assist Sit to supine: Mod assist        Transfers Overall transfer level: Needs assistance Equipment used: Rolling walker (2 wheels) Transfers: Sit to/from  Stand Sit to Stand: Mod assist                Ambulation/Gait Ambulation/Gait assistance: Min assist Gait Distance (Feet): 30 Feet Assistive device: Rolling walker (2 wheels) Gait Pattern/deviations: Step-to pattern, Decreased stride length, Decreased weight shift to right, Decreased weight shift to left, Shuffle Gait velocity: decreased     General Gait Details: cueing to take "big steps" otherwise patient would shuffle feet.  On occasion got "ahead" of his feet and required assistance to regain balance  Stairs            Wheelchair Mobility     Tilt Bed    Modified Rankin (Stroke Patients Only)       Balance Overall balance assessment: Needs assistance Sitting-balance support: No upper extremity supported, Feet supported Sitting balance-Leahy Scale: Fair     Standing balance support: Bilateral upper extremity supported, Reliant on assistive device for balance, During functional activity Standing balance-Leahy Scale: Poor                               Pertinent Vitals/Pain Pain Assessment Pain Assessment: No/denies pain    Home Living Family/patient expects to be discharged to:: Private residence Living Arrangements: Children Available Help at Discharge: Family;Available 24 hours/day Type of Home: House Home Access: Stairs to enter   [employer] of Steps: 2   Home Layout: One level Home Equipment: Agricultural consultant (2 wheels)      Prior Function Prior Level of Function : Needs assist       Physical Assist : Mobility (physical) Mobility (physical):  Bed mobility;Transfers;Gait;Stairs   Mobility Comments: Daughter reports patient requiring more assistance for mobility over the past few months.  Pt with recent falls (May and June).       Hand Dominance        Extremity/Trunk Assessment   Upper Extremity Assessment Upper Extremity Assessment: Generalized weakness    Lower Extremity Assessment Lower Extremity  Assessment: Generalized weakness;RLE deficits/detail;LLE deficits/detail RLE Deficits / Details: unable to fully extend knee, right > left LLE Deficits / Details: unable to fully extend knee    Cervical / Trunk Assessment Cervical / Trunk Assessment: Kyphotic  Communication   Communication: HOH  Cognition Arousal/Alertness: Awake/alert Behavior During Therapy: WFL for tasks assessed/performed Overall Cognitive Status: Within Functional Limits for tasks assessed                                          General Comments      Exercises General Exercises - Lower Extremity Ankle Circles/Pumps: AROM, Both, 10 reps, Seated Quad Sets: AROM, Both, 10 reps, Supine Gluteal Sets: AROM, Both, 10 reps, Supine   Assessment/Plan    PT Assessment Patient needs continued PT services  PT Problem List Decreased range of motion;Decreased activity tolerance;Decreased mobility;Decreased balance       PT Treatment Interventions Gait training;Therapeutic activities;Therapeutic exercise;Functional mobility training;Balance training;Patient/family education    PT Goals (Current goals can be found in the Care Plan section)  Acute Rehab PT Goals Patient Stated Goal: per daughter:  moving better PT Goal Formulation: With patient/family Time For Goal Achievement: 09/08/22 Potential to Achieve Goals: Fair    Frequency Min 2X/week     Co-evaluation               AM-PAC PT "6 Clicks" Mobility  Outcome Measure Help needed turning from your back to your side while in a flat bed without using bedrails?: A Little Help needed moving from lying on your back to sitting on the side of a flat bed without using bedrails?: A Lot Help needed moving to and from a bed to a chair (including a wheelchair)?: A Lot Help needed standing up from a chair using your arms (e.g., wheelchair or bedside chair)?: A Lot Help needed to walk in hospital room?: A Little Help needed climbing 3-5 steps with  a railing? : A Lot 6 Click Score: 14    End of Session Equipment Utilized During Treatment: Gait belt Activity Tolerance: Patient tolerated treatment well Patient left: in bed;with call bell/phone within reach;with family/visitor present   PT Visit Diagnosis: Unsteadiness on feet (R26.81);Muscle weakness (generalized) (M62.81);History of falling (Z91.81);Repeated falls (R29.6)    Time: 1610-9604 PT Time Calculation (min) (ACUTE ONLY): 22 min   Charges:   PT Evaluation $PT Eval Moderate Complexity: 1 Mod   PT General Charges $$ ACUTE PT VISIT: 1 Visit         08/25/2022 Delray Alt, PT Acute Rehabilitation Services Office:  [phone]   Olivia Canter 08/25/2022, 5:14 PM

## 2022-08-26 ENCOUNTER — Inpatient Hospital Stay (HOSPITAL_COMMUNITY): Payer: Medicare Other

## 2022-08-26 DIAGNOSIS — G9341 Metabolic encephalopathy: Secondary | ICD-10-CM | POA: Diagnosis not present

## 2022-08-26 LAB — CBC WITH DIFFERENTIAL/PLATELET
Abs Immature Granulocytes: 0.01 10*3/uL (ref 0.00–0.07)
Basophils Absolute: 0 10*3/uL (ref 0.0–0.1)
Basophils Relative: 0 %
Eosinophils Absolute: 0.1 10*3/uL (ref 0.0–0.5)
Eosinophils Relative: 2 %
HCT: 33.6 % — ABNORMAL LOW (ref 39.0–52.0)
Hemoglobin: 11.1 g/dL — ABNORMAL LOW (ref 13.0–17.0)
Immature Granulocytes: 0 %
Lymphocytes Relative: 27 %
Lymphs Abs: 1.5 10*3/uL (ref 0.7–4.0)
MCH: 33 pg (ref 26.0–34.0)
MCHC: 33 g/dL (ref 30.0–36.0)
MCV: 100 fL (ref 80.0–100.0)
Monocytes Absolute: 0.6 10*3/uL (ref 0.1–1.0)
Monocytes Relative: 10 %
Neutro Abs: 3.4 10*3/uL (ref 1.7–7.7)
Neutrophils Relative %: 61 %
Platelets: 165 10*3/uL (ref 150–400)
RBC: 3.36 MIL/uL — ABNORMAL LOW (ref 4.22–5.81)
RDW: 16 % — ABNORMAL HIGH (ref 11.5–15.5)
WBC: 5.6 10*3/uL (ref 4.0–10.5)
nRBC: 0 % (ref 0.0–0.2)

## 2022-08-26 LAB — BASIC METABOLIC PANEL
Anion gap: 14 (ref 5–15)
BUN: 33 mg/dL — ABNORMAL HIGH (ref 8–23)
CO2: 26 mmol/L (ref 22–32)
Calcium: 8.9 mg/dL (ref 8.9–10.3)
Chloride: 96 mmol/L — ABNORMAL LOW (ref 98–111)
Creatinine, Ser: 1.28 mg/dL — ABNORMAL HIGH (ref 0.61–1.24)
GFR, Estimated: 50 mL/min — ABNORMAL LOW (ref >60)
Glucose, Bld: 87 mg/dL (ref 70–99)
Potassium: 4.1 mmol/L (ref 3.5–5.1)
Sodium: 136 mmol/L (ref 135–145)

## 2022-08-26 LAB — C-REACTIVE PROTEIN: CRP: 10.6 mg/dL — ABNORMAL HIGH (ref <1.0)

## 2022-08-26 LAB — PROCALCITONIN: Procalcitonin: <0.1 ng/mL

## 2022-08-26 LAB — MAGNESIUM: Magnesium: 1.8 mg/dL (ref 1.7–2.4)

## 2022-08-26 LAB — MRSA NEXT GEN BY PCR, NASAL: MRSA by PCR Next Gen: NOT DETECTED

## 2022-08-26 LAB — BRAIN NATRIURETIC PEPTIDE: B Natriuretic Peptide: 260.3 pg/mL — ABNORMAL HIGH (ref 0.0–100.0)

## 2022-08-26 MED ORDER — POTASSIUM CHLORIDE ER 10 MEQ PO TBCR: 10.0000 meq | EXTENDED_RELEASE_TABLET | Freq: Every day | ORAL | 0 refills | Status: DC

## 2022-08-26 MED ORDER — FUROSEMIDE 40 MG PO TABS: 40.0000 mg | ORAL_TABLET | Freq: Every day | ORAL | 0 refills | Status: DC

## 2022-08-26 MED ORDER — METOPROLOL TARTRATE 25 MG PO TABS: ORAL_TABLET | ORAL | 0 refills | Status: DC

## 2022-08-26 MED ORDER — LEVOFLOXACIN 500 MG PO TABS: 500.0000 mg | ORAL_TABLET | ORAL | 0 refills | Status: AC

## 2022-08-26 NOTE — Progress Notes (Signed)
Discharge instructions (including medications) discussed with and copy provided to patient/caregiver. PIV x 1 removed and patient assisted with dressing. Home oxygen at bedside. Patient to be discharged home with family.

## 2022-08-26 NOTE — Discharge Instructions (Signed)
Follow with Primary MD Geoffry Paradise, MD in 7 days   Get CBC, CMP, 2 view Chest X ray -  checked next visit with your primary MD    Activity: As tolerated with Full fall precautions use walker/cane & assistance as needed  Disposition Home   Diet: Soft diet and sitting upright position with feeding assistance and aspiration precautions.  Strict 1.5 L fluid restriction per day.  Check your Weight same time everyday, if you gain over 2 pounds, or you develop in leg swelling, experience more shortness of breath or chest pain, call your Primary MD immediately. Follow Cardiac Low Salt Diet and 1.5 lit/day fluid restriction.  Special Instructions: If you have smoked or chewed Tobacco  in the last 2 yrs please stop smoking, stop any regular Alcohol  and or any Recreational drug use.  On your next visit with your primary care physician please Get Medicines reviewed and adjusted.  Please request your Prim.MD to go over all Hospital Tests and Procedure/Radiological results at the follow up, please get all Hospital records sent to your Prim MD by signing hospital release before you go home.  If you experience worsening of your admission symptoms, develop shortness of breath, life threatening emergency, suicidal or homicidal thoughts you must seek medical attention immediately by calling 911 or calling your MD immediately  if symptoms less severe.  You Must read complete instructions/literature along with all the possible adverse reactions/side effects for all the Medicines you take and that have been prescribed to you. Take any new Medicines after you have completely understood and accpet all the possible adverse reactions/side effects.

## 2022-08-26 NOTE — Plan of Care (Signed)
Patient currently between 2 and 3 L of oxygen at all times in the hospital at rest, he was observed with removal of oxygen, on room air, while at rest in the bed, pulse ox 87% at rest without any exertion, patient experiencing shortness of breath, upon application of 3 L nasal cannula oxygen pulse ox 92% and patient feels relieved, will be discharged on 3 L nasal cannula oxygen.

## 2022-08-26 NOTE — TOC Transition Note (Signed)
Transition of Care (TOC) - CM/SW Discharge Note Donn Pierini RN, BSN Transitions of Care Unit 4E- RN Case Manager See Treatment Team for direct phone # Weekend cross coverage  Patient Details  Name: Dracen Fluharty MRN: [id number] Date of Birth: 1921/06/08  Transition of Care [facility]) CM/SW Contact:  Darrold Span, RN Phone Number: 08/26/2022, 11:30 AM   Clinical Narrative:    Pt stable for transition home today, orders placed for [facility] and DME- 02 needs.   Cm spoke with pt and daughter at the bedside- discussed HH and DME needs. Choice offered for providers- with list provided Per CMS guidelines from PhoneFinancing[employer]pl website with star ratings (copy placed in shadow chart) Per daughter they would like to use [facility] for home 02 needs, and [facility] as first choice for [facility] with [facility] as backup.  Daughter voiced pt has 3n1/BSC at home- no need for new BSC at this time.   Address, phone # and PCP all confirmed, daughter to provide transport home.   Call made to [facility] w/e liaison for home 02 needs- portable 02 will be delivered to the room for transport home once processed w/ insurance.   Call made to [facility] for [facility] needs- referral has been accepted for RN/PT/aide/SLP/SW- they will contact within 48hr to schedule.   No further TOC needs noted.    Final next level of care: Home w Home Health Services Barriers to Discharge: No Barriers Identified   Patient Goals and CMS Choice CMS Medicare.gov Compare Post Acute Care list provided to:: Patient Choice offered to / list presented to : Patient, Adult Children  Discharge Placement                         Discharge Plan and Services Additional resources added to the After Visit Summary for     Discharge Planning Services: CM Consult Post Acute Care Choice: Durable Medical Equipment, Home Health          DME Arranged: Oxygen DME Agency: [facility] Date DME Agency Contacted: 08/26/22 Time DME  Agency Contacted: 1000   HH Arranged: RN, Disease Management, PT, Nurse's Aide, Speech Therapy, Social Work [employer] Agency: [facility] Date [facility] Agency Contacted: 08/26/22 Time HH Agency Contacted: 1005 Representative spoke with at [facility] Agency: Kandee Keen  Social Determinants of Health (SDOH) Interventions SDOH Screenings   Food Insecurity: No Food Insecurity (08/24/2022)  Housing: Low Risk  (08/24/2022)  Transportation Needs: No Transportation Needs (08/24/2022)  Utilities: Not At Risk (08/24/2022)  Tobacco Use: Low Risk  (08/25/2022)     Readmission Risk Interventions    08/26/2022   11:30 AM  Readmission Risk Prevention Plan  Post Dischage Appt Complete  Medication Screening Complete  Transportation Screening Complete

## 2022-08-26 NOTE — Discharge Summary (Signed)
Terry Gregory UJW:[id number] DOB: [birth year] DOA: 08/24/2022  PCP: Terry Paradise, MD  Admit date: 08/24/2022  Discharge date: 08/26/2022  Admitted From: Home   Disposition:  Home   Recommendations for Outpatient Follow-up:   Follow up with PCP in 1-2 weeks  PCP Please obtain BMP/CBC, 2 view CXR in 1week,  (see Discharge instructions)   PCP Please follow up on the following pending results: Monitor blood pressure, BMP closely, outpatient cardiology follow-up   Home Health: PT, speech, social work Equipment/Devices: As below Consultations: None  Discharge Condition: Stable    CODE STATUS: DNR Diet Recommendation: Dysphagia to soft diet with feeding assistance and aspiration precautions, 1.5 L fluid restriction per day  Chief Complaint  Patient presents with   Altered Mental Status     Brief history of present illness from the day of admission and additional interim summary     89 y.o. male with medical history significant for symptomatic bradycardia with pacemaker, BPH, and atrial flutter not anticoagulated, who presented to the emergency department for evaluation of somnolence and confusion consistent with UTI and possible acute on chronic diastolic CHF.                                                                   Hospital Course   1. Acute encephalopathy   - Presents with increased somnolence and intermittent confusion, no headache or focal deficits, head CT stable, this was likely due to combination of UTI and acute on chronic CHF, he was treated with antibiotics for UTI, cultures negative, diuresed for CHF, mentation improved.  He does have mild hospital-acquired delirium but overall stable, discussed the plan with daughter, he likely will get worse delirium in the hospital, he is DNR and goal of care  is quality of life and maintaining his dignity.  Will be discharged home on oral antibiotics and oral diuretics with home health and social work follow-up, if declines focus on comfort.   2. Acute CHF on chronic diastolic and systolic CHF last EF 60% in 2022, EF now dropped to 45% -Echo noted, responded well to IV diuretics, creatinine has bumped a little so diuretics will be cut back, blood pressure is soft hence only Lopressor, will be discharged on oral Lopressor lowest dose as blood pressure is low along with Lasix 40 mg daily and potassium supplement.  Outpatient follow-up with PCP and cardiology for blood pressure and BMP monitoring, plan discussed with daughter, at this age goal of care is maintaining his dignity and quality of life, if declines focus on comfort.   3. UTI   - Not septic on admission, currently review #1 above.  4. Atrial fibrillation/flutter - Followed by cardiology and not anticoagulated d/t bleeding risk , Continue ASA, low-dose beta-blocker and monitor   5.  Mild dysphagia -  speech is following, for comfort placed on dysphagia 2 diet.  Home speech therapy.  6.  Mild AKI.  Cut back diuretics, still has fluid overload, low-dose Lasix with outpatient follow-up with PCP.   Discharge diagnosis     Principal Problem:   Acute metabolic encephalopathy Active Problems:   HTN (hypertension)   Typical atrial flutter (HCC)   Acute on chronic heart failure with preserved ejection fraction (HFpEF) (HCC)   UTI (urinary tract infection)    Discharge instructions    Discharge Instructions     Discharge instructions   Complete by: As directed    Follow with Primary MD Terry Paradise, MD in 7 days   Get CBC, CMP, 2 view Chest X ray -  checked next visit with your primary MD    Activity: As tolerated with Full fall precautions use walker/cane & assistance as needed  Disposition Home   Diet: Soft diet and sitting upright position with feeding assistance and  aspiration precautions.  Strict 1.5 L fluid restriction per day.  Check your Weight same time everyday, if you gain over 2 pounds, or you develop in leg swelling, experience more shortness of breath or chest pain, call your Primary MD immediately. Follow Cardiac Low Salt Diet and 1.5 lit/day fluid restriction.  Special Instructions: If you have smoked or chewed Tobacco  in the last 2 yrs please stop smoking, stop any regular Alcohol  and or any Recreational drug use.  On your next visit with your primary care physician please Get Medicines reviewed and adjusted.  Please request your Prim.MD to go over all Hospital Tests and Procedure/Radiological results at the follow up, please get all Hospital records sent to your Prim MD by signing hospital release before you go home.  If you experience worsening of your admission symptoms, develop shortness of breath, life threatening emergency, suicidal or homicidal thoughts you must seek medical attention immediately by calling 911 or calling your MD immediately  if symptoms less severe.  You Must read complete instructions/literature along with all the possible adverse reactions/side effects for all the Medicines you take and that have been prescribed to you. Take any new Medicines after you have completely understood and accpet all the possible adverse reactions/side effects.   Increase activity slowly   Complete by: As directed        Discharge Medications   Allergies as of 08/26/2022       Reactions   Erythromycin Hives, Rash   Penicillins Hives, Rash        Medication List     TAKE these medications    aspirin EC 81 MG tablet Take 81 mg by mouth daily.   finasteride 5 MG tablet Commonly known as: PROSCAR Take 5 mg by mouth daily.   furosemide 40 MG tablet Commonly known as: Lasix Take 1 tablet (40 mg total) by mouth daily.   levofloxacin 500 MG tablet Commonly known as: Levaquin Take 1 tablet (500 mg total) by mouth every  other day for 4 days.   metoprolol tartrate 25 MG tablet Commonly known as: LOPRESSOR Take half a pill twice a day   omeprazole 20 MG capsule Commonly known as: PRILOSEC Take 20 mg by mouth daily.   potassium chloride 10 MEQ tablet Commonly known as: KLOR-CON Take 1 tablet (10 mEq total) by mouth daily.               Durable Medical Equipment  (From admission, onward)  Start     Ordered   08/26/22 0915  For home use only DME oxygen  Once       Question Answer Comment  Length of Need Lifetime   Mode or (Route) Nasal cannula   Liters per Minute 3   Frequency Continuous (stationary and portable oxygen unit needed)   Oxygen conserving device Yes   Oxygen delivery system Gas      08/26/22 0914   08/26/22 0915  For home use only DME Bedside commode  Once       Question:  Patient needs a bedside commode to treat with the following condition  Answer:  Weakness   08/26/22 0914             Follow-up Information     Terry Paradise, MD. Schedule an appointment as soon as possible for a visit in 1 week(s).   Specialty: Internal Medicine Contact information: [address] [place] [address] [phone]         Jake Bathe, MD. Schedule an appointment as soon as possible for a visit in 1 week(s).   Specialty: Cardiology Contact information: [address] [place] [address] [phone]                 Major procedures and Radiology Reports - PLEASE review detailed and final reports thoroughly  -       DG Chest Port 1 View  Result Date: 08/26/2022 CLINICAL DATA:  89 year old male with shortness of breath. EXAM: PORTABLE CHEST 1 VIEW COMPARISON:  Portable chest 08/25/2022 and earlier. FINDINGS: Portable AP upright view at 0737 hours. Persistent bilateral pleural effusions obscuring the diaphragm. Mildly improved lung volumes. Mildly regressed pulmonary vascularity. No pneumothorax. No air bronchograms. Stable  cardiac size and mediastinal contours. Stable left chest cardiac pacemaker. Visualized tracheal air column is within normal limits. Stable visualized osseous structures. IMPRESSION: Ongoing bilateral pleural effusions, probably small. Mildly regressed pulmonary edema and mildly improved lung volumes and ventilation since yesterday. Electronically Signed   By: Odessa Fleming M.D.   On: 08/26/2022 08:16   ECHOCARDIOGRAM COMPLETE  Result Date: 08/25/2022    ECHOCARDIOGRAM REPORT   Patient Name:   Terry SALDIERNA Date of Exam: 08/25/2022 Medical Rec #:  [id number]     Height:       75.0 in Accession #:    [id number]    Weight:       210.0 lb Date of Birth:  [birth year]      BSA:          2.240 m Patient Age:    89 years      BP:           132/77 mmHg Patient Gender: M             HR:           62 bpm. Exam Location:  Inpatient Procedure: Cardiac Doppler, 2D Echo and Color Doppler Indications:    CHF- Acute Diastolic  History:        Patient has prior history of Echocardiogram examinations.                 Pacemaker, Arrythmias:Atrial Flutter;                 Signs/Symptoms:Hypotension.  Sonographer:    Raeford Razor Referring Phys: [id number] TIMOTHY S OPYD IMPRESSIONS  1. Left ventricular ejection fraction, by estimation, is 40 to 45%. The left ventricle has mildly decreased function. Left ventricular  endocardial border not optimally defined to evaluate regional wall motion. There is mild left ventricular hypertrophy.  Left ventricular diastolic parameters are consistent with Grade I diastolic dysfunction (impaired relaxation).  2. Right ventricular systolic function is normal. The right ventricular size is normal.  3. The mitral valve is abnormal. Trivial mitral valve regurgitation. No evidence of mitral stenosis.  4. The tricuspid valve is abnormal. Tricuspid valve regurgitation is moderate.  5. The aortic valve has an indeterminant number of cusps. There is moderate calcification of the aortic valve. Aortic valve regurgitation is  not visualized. No aortic stenosis is present.  6. Aortic dilatation noted. There is borderline dilatation of the aortic root, measuring 38 mm. There is mild dilatation of the ascending aorta, measuring 41 mm.  7. The inferior vena cava is normal in size with greater than 50% respiratory variability, suggesting right atrial pressure of 3 mmHg. Comparison(s): Changes from prior study are noted. LVEF worsened from normal to 40-45% now. FINDINGS  Left Ventricle: Left ventricular ejection fraction, by estimation, is 40 to 45%. The left ventricle has mildly decreased function. Left ventricular endocardial border not optimally defined to evaluate regional wall motion. The left ventricular internal cavity size was normal in size. There is mild left ventricular hypertrophy. Abnormal (paradoxical) septal motion, consistent with RV pacemaker. Left ventricular diastolic parameters are consistent with Grade I diastolic dysfunction (impaired relaxation). Right Ventricle: The right ventricular size is normal. No increase in right ventricular wall thickness. Right ventricular systolic function is normal. Left Atrium: Left atrial size was normal in size. Right Atrium: Right atrial size was normal in size. Pericardium: There is no evidence of pericardial effusion. Mitral Valve: The mitral valve is abnormal. Mild to moderate mitral annular calcification. Trivial mitral valve regurgitation. No evidence of mitral valve stenosis. Tricuspid Valve: The tricuspid valve is abnormal. Tricuspid valve regurgitation is moderate . No evidence of tricuspid stenosis. Aortic Valve: The aortic valve has an indeterminant number of cusps. There is moderate calcification of the aortic valve. Aortic valve regurgitation is not visualized. No aortic stenosis is present. Aortic valve peak gradient measures 7.1 mmHg. Pulmonic Valve: The pulmonic valve was grossly normal. Pulmonic valve regurgitation is not visualized. No evidence of pulmonic stenosis.  Aorta: Aortic dilatation noted. There is borderline dilatation of the aortic root, measuring 38 mm. There is mild dilatation of the ascending aorta, measuring 41 mm. Venous: The inferior vena cava is normal in size with greater than 50% respiratory variability, suggesting right atrial pressure of 3 mmHg. IAS/Shunts: No atrial level shunt detected by color flow Doppler. Additional Comments: A device lead is visualized.  LEFT VENTRICLE PLAX 2D LVIDd:         5.60 cm      Diastology LVIDs:         4.10 cm      LV e' medial:    4.68 cm/s LV PW:         1.30 cm      LV E/e' medial:  16.9 LV IVS:        1.30 cm      LV e' lateral:   7.40 cm/s LVOT diam:     2.00 cm      LV E/e' lateral: 10.7 LV SV:         63 LV SV Index:   28 LVOT Area:     3.14 cm  LV Volumes (MOD) LV vol d, MOD A2C: 180.0 ml LV vol d, MOD A4C: 165.0 ml LV vol  s, MOD A2C: 105.0 ml LV vol s, MOD A4C: 90.3 ml LV SV MOD A2C:     75.0 ml LV SV MOD A4C:     165.0 ml LV SV MOD BP:      75.4 ml RIGHT VENTRICLE          IVC RV Basal diam:  2.90 cm  IVC diam: 1.90 cm LEFT ATRIUM             Index        RIGHT ATRIUM           Index LA diam:        4.00 cm 1.79 cm/m   RA Area:     17.80 cm LA Vol (A2C):   96.5 ml 43.07 ml/m  RA Volume:   58.50 ml  26.11 ml/m LA Vol (A4C):   54.2 ml 24.19 ml/m LA Biplane Vol: 73.4 ml 32.76 ml/m  AORTIC VALVE AV Area (Vmax): 2.04 cm AV Vmax:        133.00 cm/s AV Peak Grad:   7.1 mmHg LVOT Vmax:      86.50 cm/s LVOT Vmean:     60.550 cm/s LVOT VTI:       0.199 m  AORTA Ao Root diam: 3.80 cm Ao Asc diam:  4.10 cm MITRAL VALVE               TRICUSPID VALVE MV Area (PHT): 2.74 cm    TV Peak grad:   31.8 mmHg MV Decel Time: 277 msec    TV Vmax:        2.82 m/s MV E velocity: 79.10 cm/s  TR Peak grad:   32.0 mmHg MV A velocity: 93.30 cm/s  TR Vmax:        283.00 cm/s MV E/A ratio:  0.85                            SHUNTS                            Systemic VTI:  0.20 m                            Systemic Diam: 2.00 cm Vishnu  Priya Mallipeddi Electronically signed by Winfield Rast Mallipeddi Signature Date/Time: 08/25/2022/4:32:22 PM    Final    DG Swallowing Func-Speech Pathology  Result Date: 08/25/2022 Table formatting from the original result was not included. Modified Barium Swallow Study Patient Details Name: Gaudencio Uscanga MRN: [id number] Date of Birth: [birth year] Today's Date: 08/25/2022 HPI/PMH: HPI: Curley Esmaili is a 89 y.o. male with medical history significant for symptomatic bradycardia with pacemaker, BPH, and atrial flutter not anticoagulated, who presented to the emergency department for evaluation of somnolence and confusion.  Patient's daughter notes that the patient has been sleeping more than usual during the daytime since 08/20/2022.  He has also had episodes of confusion.  Family also notes that patient has been drooling some for the past 2 weeks.  Patient himself has no complaints, feels that he is doing well, and does not understand why he is in the hospital.  He was diagnosed with acute encephalopathy, acute CHF, UTI and afib/flutter.  CT of the head was showing no evidence of an acute intracranial abnormality with chronic lacunar infarcts in the left corono radiata/basal ganglia.  Chest xray was showing small  bilateral pleural effusions and hazy airspace opacities superimposed atelectasis or infection. Clinical Impression: MBS was completed in the lateral projection with barium impregnated thin liquids via spoon and cup, mildly and moderately thick liquids via cup, pureed material and dual textured solids.  He presented with an oral and pharyngeal dysphagia.  In addition, he likely has an esophageal dysphagia as sweep revealed it slow to clear with retrograde flow back into the pyriform sinuses.   The patient was not aware he is having swallowing issues.  Oral deficits were noted for lingual control, lingual motion and bolus prep/mastication.  These deficits led to a delay in A/P transport, premature spillage and  oral residue.  Thin liquids and moderately thick liquids were noted to resonate in the pyriform sinsues/laryngeal vestibule with all other textures resonating in the vallecula prior to the swallow trigger.  Pharyngeal deficits were noted for laryngeal elevation, anterior hyoid excursion, laryngeal vestibule closure, pharyngeal stripping wave and base of tongue retraction.  These deficits led to vallecula and pyriform sinsus residue that he did not appear to be sensate to.  Some of the pyriform sinus residue was due to backflow from the esophagus.  Residue occured across all textures but got worse as the material got thicker.  Penetration into the laryngael vestibule was seen during the swallow given thin liquids.  At some point while fluoroscopy was turned off aspiration occured.  Coating was seen on the tracheal wall when fluoroscopy was turned back on.  Cued cough and reswallow helped to clear material in the vestibule but not from the airway.  Penetration to the level of the vocal folds was seen prior to the swallow with aspiration occuring during the swallow given mildly thick liquids.  Penetration to the level of the vocal folds was seen after the swallow given moderately thick liquids.  Again cued cough was helpful to clear material from the laryngeal vestibule but not the airway.  No penetration/aspiration was seen given pureed material or dual textured solids.   There is concern for backflow though over the course of a meal leading to penetration/aspiration.  Discussion with the patient's daughter Larene Beach took place via phone and she'd like him to be able to continue to eat/drink.  Discussed risks of aspiration including aspiration PNA and educated on ways to mitigate that risk (ie oral care 2-3 times per day using a toothbrush and toothpaste.  Given this suggest a dysphagia 2 diet with thin liquids.  Please note patient will still penetrate/aspirate on this diet.  ST will follow to initiate swallowing  therapy and patient/family education.  He would benefit from ongoing therapy at the next level of care.  Factors that may increase risk of adverse event in presence of aspiration Rubye Oaks & Clearance Coots 2021): Factors that may increase risk of adverse event in presence of aspiration Rubye Oaks & Clearance Coots 2021): Poor general health and/or compromised immunity; Reduced cognitive function; Limited mobility; Frail or deconditioned; Dependence for feeding and/or oral hygiene; Frequent aspiration of large volumes Recommendations/Plan: Swallowing Evaluation Recommendations Swallowing Evaluation Recommendations Recommendations: PO diet PO Diet Recommendation: -- (Given discussion with the patient's daughter decision was made to begin him on dysphagia 2 diet with thin liquids.) Medication Administration: Crushed with puree Supervision: Staff to assist with self-feeding Swallowing strategies  : Minimize environmental distractions; Check for pocketing or oral holding; Follow solids with liquids Postural changes: Stay upright 30-60 min after meals; Position pt fully upright for meals Oral care recommendations: Oral care QID (4x/day) Treatment Plan Treatment Plan Treatment  recommendations: Therapy as outlined in treatment plan below Follow-up recommendations: Home health SLP Functional status assessment: Patient has had a recent decline in their functional status and demonstrates the ability to make significant improvements in function in a reasonable and predictable amount of time. Treatment frequency: Min 2x/week Treatment duration: 2 weeks Recommendations Recommendations for follow up therapy are one component of a multi-disciplinary discharge planning process, led by the attending physician.  Recommendations may be updated based on patient status, additional functional criteria and insurance authorization. Assessment: Orofacial Exam: Orofacial Exam Oral Cavity - Dentition: Adequate natural dentition Anatomy: Anatomy: WFL Boluses  Administered: Boluses Administered Boluses Administered: Thin liquids (Level 0); Mildly thick liquids (Level 2, nectar thick); Moderately thick liquids (Level 3, honey thick); Puree; Solid  Oral Impairment Domain: Oral Impairment Domain Lip Closure: No labial escape Tongue control during bolus hold: Not tested (Patient was not able to do this but generalized issues with lingual control were seen during A/P transport) Bolus preparation/mastication: Slow prolonged chewing/mashing with complete recollection Bolus transport/lingual motion: Delayed initiation of tongue motion (oral holding) Oral residue: Residue collection on oral structures Location of oral residue : Tongue Initiation of pharyngeal swallow : Pyriform sinuses; Posterior laryngeal surface of the epiglottis; Valleculae  Pharyngeal Impairment Domain: Pharyngeal Impairment Domain Soft palate elevation: No bolus between soft palate (SP)/pharyngeal wall (PW) Laryngeal elevation: Partial superior movement of thyroid cartilage/partial approximation of arytenoids to epiglottic petiole Anterior hyoid excursion: Partial anterior movement Epiglottic movement: Complete inversion Laryngeal vestibule closure: Incomplete, narrow column air/contrast in laryngeal vestibule Pharyngeal stripping wave : Present - diminished Pharyngeal contraction (A/P view only): N/A Pharyngoesophageal segment opening: Complete distension and complete duration, no obstruction of flow Tongue base retraction: Narrow column of contrast or air between tongue base and PPW Pharyngeal residue: Collection of residue within or on pharyngeal structures Location of pharyngeal residue: Valleculae; Pyriform sinuses  Esophageal Impairment Domain: Esophageal Impairment Domain Esophageal clearance upright position: Esophageal retention with retrograde flow below pharyngoesophageal segment (PES) Penetration/Aspiration Scale Score: Penetration/Aspiration Scale Score 1.  Material does not enter airway: Puree;  Solid 5.  Material enters airway, CONTACTS cords and not ejected out: Moderately thick liquids (Level 3, honey thick) 7.  Material enters airway, passes BELOW cords and not ejected out despite cough attempt by patient: Thin liquids (Level 0); Mildly thick liquids (Level 2, nectar thick) Compensatory Strategies: Compensatory Strategies Compensatory strategies: Yes Other(comment): Ineffective (Cued cough and reswallow was effective to clear material from the laryngeal vesitbule but not the airway.)   General Information: No data recorded Diet Prior to this Study: NPO   No data recorded  Respiratory Status: WFL   No data recorded  History of Recent Intubation: No  Behavior/Cognition: Alert; Cooperative; Requires cueing Self-Feeding Abilities: Needs assist with self-feeding Baseline vocal quality/speech: Normal No data recorded Volitional Swallow: Able to elicit Exam Limitations: No limitations Goal Planning: Prognosis for improved oropharyngeal function: Guarded Barriers to Reach Goals: Cognitive deficits No data recorded Patient/Family Stated Goal: None stated Consulted and agree with results and recommendations: -- (Daughter Vickie) Pain: No data recorded End of Session: Start Time:SLP Start Time (ACUTE ONLY): 1135 Stop Time: SLP Stop Time (ACUTE ONLY): 1150 Time Calculation:SLP Time Calculation (min) (ACUTE ONLY): 15 min Charges: SLP Evaluations $ SLP Speech Visit: 1 Visit SLP Evaluations $BSS Swallow: 1 Procedure $MBS Swallow: 1 Procedure SLP visit diagnosis: SLP Visit Diagnosis: Dysphagia, oropharyngeal phase (R13.12) Past Medical History: Past Medical History: Diagnosis Date  BPH (benign prostatic hyperplasia)   Diverticulosis   Pericardial effusion  Past Surgical History: Past Surgical History: Procedure Laterality Date  APPENDECTOMY  1960  CATARACT EXTRACTION  2009  both eyes  CHOLECYSTECTOMY  2000  ERCP N/A 06/24/2020  Procedure: ENDOSCOPIC RETROGRADE CHOLANGIOPANCREATOGRAPHY (ERCP);  Surgeon: Kerin Salen, MD;   Location: [facility] ENDOSCOPY;  Service: Gastroenterology;  Laterality: N/A;  INGUINAL HERNIA REPAIR  1980's  PACEMAKER IMPLANT N/A 03/21/2020  Procedure: PACEMAKER IMPLANT;  Surgeon: Thurmon Fair, MD;  Location: [facility] INVASIVE CV LAB;  Service: Cardiovascular;  Laterality: N/A;  REMOVAL OF STONES  06/24/2020  Procedure: REMOVAL OF STONES;  Surgeon: Kerin Salen, MD;  Location: [facility] ENDOSCOPY;  Service: Gastroenterology;;  Dennison Mascot  06/24/2020  Procedure: Dennison Mascot;  Surgeon: Kerin Salen, MD;  Location: [facility] ENDOSCOPY;  Service: Gastroenterology;;  STONE EXTRACTION WITH BASKET  06/24/2020  Procedure: STONE EXTRACTION WITH BASKET;  Surgeon: Kerin Salen, MD;  Location: [facility] ENDOSCOPY;  Service: Gastroenterology;;  Basket crushed stones  TOTAL HIP ARTHROPLASTY  2003  right  WRIST SURGERY  2009  left Dimas [place], [place], CCC-SLP Acute Rehab SLP [phone] Fleet Contras 08/25/2022, 1:18 PM  DG Chest Port 1 View  Result Date: 08/25/2022 CLINICAL DATA:  89 year old male with history of shortness of breath. EXAM: PORTABLE CHEST 1 VIEW COMPARISON:  Chest x-ray 08/24/2022. FINDINGS: Lung volumes are low. Bibasilar opacities may reflect areas of atelectasis and/or consolidation. There is cephalization of the pulmonary vasculature and slight indistinctness of the interstitial markings suggestive of mild pulmonary edema. Small bilateral pleural effusions. Mild cardiomegaly. The patient is rotated to the right on today's exam, resulting in distortion of the mediastinal contours and reduced diagnostic sensitivity and specificity for mediastinal pathology. Atherosclerotic calcifications in the thoracic aorta. Left-sided pacemaker device in place with lead tips projecting over the expected location of the right atrium and right ventricle. IMPRESSION: 1. The appearance of the chest is once again suggestive of congestive heart failure, as above. 2. Additional bibasilar opacities may reflect areas of atelectasis and/or consolidation, with  superimposed small bilateral pleural effusions. 3. Aortic atherosclerosis. Electronically Signed   By: Trudie Reed M.D.   On: 08/25/2022 08:41   CT Head Wo Contrast  Result Date: 08/24/2022 CLINICAL DATA:  Provided history: Mental status change, unknown cause. EXAM: CT HEAD WITHOUT CONTRAST TECHNIQUE: Contiguous axial images were obtained from the base of the skull through the vertex without intravenous contrast. RADIATION DOSE REDUCTION: This exam was performed according to the departmental dose-optimization program which includes automated exposure control, adjustment of the mA and/or kV according to patient size and/or use of iterative reconstruction technique. COMPARISON:  Head CT 06/23/2022. FINDINGS: Brain: Generalized cerebral atrophy. Redemonstrated chronic lacunar infarcts within the left corona radiata/basal ganglia. There is no acute intracranial hemorrhage. No demarcated cortical infarct. No extra-axial fluid collection. No evidence of an intracranial mass. No midline shift. Vascular: No hyperdense vessel.  Atherosclerotic calcifications. Skull: No calvarial fracture or aggressive osseous lesion. Sinuses/Orbits: No mass or acute finding within the imaged orbits. Mild mucosal thickening within the bilateral ethmoid sinuses. Minimal mucosal thickening within the right maxillary sinus at the imaged levels. IMPRESSION: 1.  No evidence of an acute intracranial abnormality. 2. Chronic lacunar infarcts within the left corona radiata/basal ganglia, unchanged from the prior head CT of 06/23/2022. 3. Generalized cerebral atrophy. 4. Mild paranasal sinus disease at the imaged levels, as described. Electronically Signed   By: Jackey Loge D.O.   On: 08/24/2022 15:57   DG Chest Portable 1 View  Result Date: 08/24/2022 CLINICAL DATA:  confusion EXAM: PORTABLE CHEST 1 VIEW COMPARISON:  CXR 06/23/22 FINDINGS: Left-sided dual lead cardiac device with unchanged lead positioning. Cardiomegaly. Small bilateral  pleural effusions. There are prominent bilateral interstitial opacities that likely represent mild pulmonary edema. There are more focal hazy airspace opacities in the bilateral lung bases that could represent superimposed atelectasis or infection. No radiographically apparent displaced rib fractures. Visualized upper abdomen is unremarkable. IMPRESSION: 1. Cardiomegaly with small bilateral pleural effusions and mild pulmonary edema. 2. Hazy airspace opacities in the bilateral lung bases could represent superimposed atelectasis or infection. Electronically Signed   By: Lorenza Cambridge M.D.   On: 08/24/2022 15:56    Micro Results    No results found for this or any previous visit (from the past 240 hour(s)).  Today   Subjective    Terry Gregory today has no headache,no chest abdominal pain,no new weakness tingling or numbness, feels much better wants to go home today.    Objective   Blood pressure 111/65, pulse 62, temperature 98.1 F (36.7 C), temperature source Oral, resp. rate 13, height 6\' 3"  (1.905 m), weight 95.3 kg, SpO2 90%.   Intake/Output Summary (Last 24 hours) at 08/26/2022 0924 Last data filed at 08/26/2022 0300 Gross per 24 hour  Intake --  Output 3150 ml  Net -3150 ml    Exam  Awake Alert, No new F.N deficits,    [place].AT,PERRAL Supple Neck,   Symmetrical Chest wall movement, Good air movement bilaterally, CTAB RRR,No Gallops,   +ve B.Sounds, Abd Soft, Non tender,  No Cyanosis, Clubbing or edema    Data Review   Recent Labs  Lab 08/24/22 1403 08/24/22 1614 08/25/22 0544 08/25/22 0715 08/26/22 0252  WBC 12.1*  --  5.7 4.8 5.6  HGB 11.3* 11.9* 10.8* 10.4* 11.1*  HCT 33.9* 35.0* 32.5* 31.6* 33.6*  PLT 157  --  143* 143* 165  MCV 99.7  --  99.4 99.1 100.0  MCH 33.2  --  33.0 32.6 33.0  MCHC 33.3  --  33.2 32.9 33.0  RDW 16.1*  --  16.0* 16.1* 16.0*  LYMPHSABS 0.8  --   --   --  1.5  MONOABS 0.3  --   --   --  0.6  EOSABS 0.0  --   --   --  0.1  BASOSABS  0.0  --   --   --  0.0    Recent Labs  Lab 08/24/22 1403 08/24/22 1614 08/25/22 0544 08/25/22 0715 08/26/22 0252  NA 134* 136 135  --  136  K 4.2 4.3 3.9  --  4.1  CL 101  --  102  --  96*  CO2 23  --  26  --  26  ANIONGAP 10  --  7  --  14  GLUCOSE 189*  --  82  --  87  BUN 30*  --  28*  --  33*  CREATININE 0.99  --  0.99  --  1.28*  AST 25  --   --   --   --   ALT 18  --   --   --   --   ALKPHOS 86  --   --   --   --   BILITOT 0.6  --   --   --   --   ALBUMIN 3.1*  --   --   --   --   CRP  --   --   --  11.8* 10.6*  PROCALCITON  --   --   --  <  0.10 <0.10  BNP 378.6*  --   --  374.9* 260.3*  MG  --   --  1.8 1.8 1.8  CALCIUM 8.8*  --  8.7*  --  8.9    Total Time in preparing paper work, data evaluation and todays exam - 35 minutes  Signature  -    Susa Raring M.D on 08/26/2022 at 9:24 AM   -  To page go to [email or web address]

## 2022-09-06 ENCOUNTER — Encounter: Payer: Self-pay | Admitting: Cardiovascular Disease

## 2022-09-06 ENCOUNTER — Ambulatory Visit: Payer: Medicare Other | Attending: Cardiovascular Disease | Admitting: Cardiovascular Disease

## 2022-09-06 VITALS — BP 112/58 | HR 61 | Ht 75.0 in | Wt 201.0 lb

## 2022-09-06 DIAGNOSIS — I441 Atrioventricular block, second degree: Secondary | ICD-10-CM

## 2022-09-06 DIAGNOSIS — I483 Typical atrial flutter: Secondary | ICD-10-CM

## 2022-09-06 DIAGNOSIS — Z950 Presence of cardiac pacemaker: Secondary | ICD-10-CM | POA: Diagnosis not present

## 2022-09-06 DIAGNOSIS — I5042 Chronic combined systolic (congestive) and diastolic (congestive) heart failure: Secondary | ICD-10-CM | POA: Diagnosis not present

## 2022-09-06 DIAGNOSIS — Z5181 Encounter for therapeutic drug level monitoring: Secondary | ICD-10-CM

## 2022-09-06 DIAGNOSIS — Z79899 Other long term (current) drug therapy: Secondary | ICD-10-CM

## 2022-09-06 DIAGNOSIS — I3139 Other pericardial effusion (noninflammatory): Secondary | ICD-10-CM

## 2022-09-06 MED ORDER — SPIRONOLACTONE 25 MG PO TABS: 25.0000 mg | ORAL_TABLET | Freq: Every day | ORAL | 3 refills | Status: DC

## 2022-09-06 MED ORDER — SPIRONOLACTONE 25 MG PO TABS: 12.5000 mg | ORAL_TABLET | Freq: Every day | ORAL | 3 refills | Status: DC

## 2022-09-06 MED ORDER — FUROSEMIDE 20 MG PO TABS: 20.0000 mg | ORAL_TABLET | Freq: Every morning | ORAL | 3 refills | Status: DC

## 2022-09-06 MED ORDER — METOPROLOL SUCCINATE ER 25 MG PO TB24: 25.0000 mg | ORAL_TABLET | Freq: Every day | ORAL | 3 refills | Status: DC

## 2022-09-06 NOTE — Patient Instructions (Signed)
Medication Instructions:  STOP METOPROLOL TARTRATE STOP POTASSIUM   START METOPROLOL SUCCINATE 25 MG A DAY START SPIRONOLACTONE 12.5 MG EVERY MORNING DECREASE FUROSEMIDE TO 20 MG EVERY MORNING *If you need a refill on your cardiac medications before your next appointment, please call your pharmacy*   Lab Work: BMP- Please return for Blood Work in 1 MONTH. No appointment needed, lab here at the office is open Monday-Friday from 8AM to 4PM and closed daily for lunch from 12:45-1:45.   If you have labs (blood work) drawn today and your tests are completely normal, you will receive your results only by: MyChart Message (if you have MyChart) OR A paper copy in the mail If you have any lab test that is abnormal or we need to change your treatment, we will call you to review the results.  Follow-Up: At [facility], you and your health needs are our priority.  As part of our continuing mission to provide you with exceptional heart care, we have created designated Provider Care Teams.  These Care Teams include your primary Cardiologist (physician) and Advanced Practice Providers (APPs -  Physician Assistants and Nurse Practitioners) who all work together to provide you with the care you need, when you need it.  We recommend signing up for the patient portal called "MyChart".  Sign up information is provided on this After Visit Summary.  MyChart is used to connect with patients for Virtual Visits (Telemedicine).  Patients are able to view lab/test results, encounter notes, upcoming appointments, etc.  Non-urgent messages can be sent to your provider as well.   To learn more about what you can do with MyChart, go to [email or web address].    Your next appointment:   4 month(s)  Provider:   Thurmon Fair, MD

## 2022-09-06 NOTE — Progress Notes (Signed)
Cardiology Office Note    Date:  09/06/2022   ID:  Terry Gregory, DOB 10-11-1921, MRN [id number]  PCP:  Geoffry Paradise, MD  Cardiologist:   Thurmon Fair, MD   Chief complaint: atrial flutter  History of Present Illness:  Terry Gregory is a 89 y.o. 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    As always he is accompanied to the clinic by his daughter.  He is not doing as well.  He was hospitalized with altered mental status 08/24/2022 through 08/26/2022.  He has been somnolent throughout the day for at least the last month, but looking at his pacemaker trends his activity level started decreasing in April.  He had 2 falls in May and again in June.  He did not lose consciousness on either occasion.  Sounds like he is much less stable on his feet and his legs are very weak.  He is using a walker.  He has "good days and bad days".  Sometimes he will be alert and interactive and other days just very sleepy.  At times in the evenings he gets very agitated and has what sounds like hallucinations.  Sounds like he may be having sundowning.  Initially when he was hospitalized was felt to have may be a urinary tract infection and/or pneumonia and was treated with antibiotics.  Did have an elevated white blood cell count of 12,000 and a urinalysis that showed bacteriuria and pyuria.  He did undergo a CT of the head on 08/24/2022 and had previous undergone a CT of the head when he had his first fall 2005 01/02/2023.  They show chronic changes of microvascular disease, tiny lacunar stroke.  They do not appear significantly changed from her previous CT of the head performed in 2016.  He did not have an MRI.  In the same hospitalization he was found to have lower extremity edema.  He underwent an  echocardiogram that shows a decline in LV ejection fraction down to about 45%.  Reviewing the echo this is probably attributable to the presence of RV apical pacing with subsequent ventricular dyssynchrony.  The echocardiogram did not show any evidence of elevated mean left atrial pressure.  The BNP was however elevated at 379.  It looks like his pacemaker was interrogated at the time of his admission to the hospital.  I cannot find any documentation of that download scanned into the chart.  Does look like he had an episode of possible atrial fibrillation or atrial flutter lasting for about 3 hours about a month before the hospitalization.  This was not associated with high ventricular rates.  Pacemaker interrogation today shows normal device function with estimated gentle Ingevity of 8.3 years, normal lead parameters, 84% atrial pacing and 99.9% ventricular pacing.  Presenting rhythm is AV sequential pacing.  He is not pacemaker dependent.  The underlying rhythm is sinus bradycardia with second-degree AV block Mobitz type I (Wenckebach cycles) and a ventricular rate of about 45 bpm.  He was asymptomatic during testing of underlying rhythm today.  Before pacemaker implantation, he had a single episode of atrial flutter with 4: 1 AV conduction in 2017, without second clinical or event monitor evidence for recurrence until a year after he went the pacemaker implantation: A 16-hour episode of atrial flutter that occurred on 04/20/2021.  As of the sparse episodes of arrhythmia and his advanced  age, we have not placed him on anticoagulation.  Otherwise device function is normal.  Estimated generator longevity is over 10 years.  He has 50% atrial pacing and 99% ventricular pacing.  Heart rate histogram distribution is appropriate.  Lead parameters are excellent.  Denies shortness of breath, chest pain, palpitations, dizziness or syncope.  Lower extremity edema has largely resolved as he is now taking a low-dose  of furosemide 40 mg daily.  He had an isolated event of documented atrial arrhythmia, atrial flutter with 4: 1 AV conduction in June 2017, recurrence not seen on a subsequent event monitor.  Atrial flutter was again recorded in March 2023, 16-hour episode recorded by his pacemaker (first time in 14 months of monitoring).  In view of his advanced age, low prevalence of the arrhythmia, absence of history of neurological events, rather unsteady gait and personal preference, anticoagulants were not prescribed.    Long-standing AV conduction abnormalities with Mobitz type I second-degree AV block, otherwise with very long first-degree AV block, right bundle branch block.  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   Past Medical History:  Diagnosis Date   BPH (benign prostatic hyperplasia)    Diverticulosis    Pericardial effusion     Past Surgical History:  Procedure Laterality Date   APPENDECTOMY  1960   CATARACT EXTRACTION  2009   both eyes   CHOLECYSTECTOMY  2000   ERCP N/A 06/24/2020   Procedure: ENDOSCOPIC RETROGRADE CHOLANGIOPANCREATOGRAPHY (ERCP);  Surgeon: Kerin Salen, MD;  Location: [facility] ENDOSCOPY;  Service: Gastroenterology;  Laterality: N/A;   INGUINAL HERNIA REPAIR  1980's   PACEMAKER IMPLANT N/A 03/21/2020   Procedure: PACEMAKER IMPLANT;  Surgeon: Thurmon Fair, MD;  Location: [facility] INVASIVE CV LAB;  Service: Cardiovascular;  Laterality: N/A;   REMOVAL OF STONES  06/24/2020   Procedure: REMOVAL OF STONES;  Surgeon: Kerin Salen, MD;  Location: [facility] ENDOSCOPY;  Service: Gastroenterology;;   Dennison Mascot  06/24/2020   Procedure: Dennison Mascot;  Surgeon: Kerin Salen, MD;  Location: [facility] ENDOSCOPY;  Service: Gastroenterology;;   STONE EXTRACTION WITH BASKET  06/24/2020    Procedure: STONE EXTRACTION WITH BASKET;  Surgeon: Kerin Salen, MD;  Location: [facility] ENDOSCOPY;  Service: Gastroenterology;;  Basket crushed stones   TOTAL HIP ARTHROPLASTY  2003   right   WRIST SURGERY  2009   left    Current Medications: Outpatient Medications Prior to Visit  Medication Sig Dispense Refill   aspirin EC 81 MG tablet Take 81 mg by mouth daily.     finasteride (PROSCAR) 5 MG tablet Take 5 mg by mouth daily.     omeprazole (PRILOSEC) 20 MG capsule Take 20 mg by mouth daily.     furosemide (LASIX) 40 MG tablet Take 1 tablet (40 mg total) by mouth daily. 30 tablet 0   metoprolol tartrate (LOPRESSOR) 25 MG tablet Take half a pill twice a day 30 tablet 0   potassium chloride (KLOR-CON) 10 MEQ tablet Take 1 tablet (10 mEq total) by mouth daily. 30 tablet 0   Facility-Administered Medications Prior to Visit  Medication Dose Route Frequency Provider Last Rate Last Admin   sodium chloride flush (NS) 0.9 % injection 3 mL  3 mL Intravenous Q12H Mersades Barbaro, MD         Allergies:   Erythromycin  and Penicillins   Social History   Socioeconomic History   Marital status: Widowed    Spouse name: Not on file   Number of children: Not on file   Years of education: Not on file   Highest education level: Not on file  Occupational History   Not on file  Tobacco Use   Smoking status: Never   Smokeless tobacco: Never  Substance and Sexual Activity   Alcohol use: No   Drug use: No   Sexual activity: Not on file  Other Topics Concern   Not on file  Social History Narrative   Not on file   Social Determinants of Health   Financial Resource Strain: Not on file  Food Insecurity: No Food Insecurity (08/24/2022)   Hunger Vital Sign    Worried About Running Out of Food in the Last Year: Never true    Ran Out of Food in the Last Year: Never true  Transportation Needs: No Transportation Needs (08/24/2022)   PRAPARE - Administrator, Civil Service (Medical): No    Lack  of Transportation (Non-Medical): No  Physical Activity: Not on file  Stress: Not on file  Social Connections: Not on file     Family History:  The patient's family history includes Cancer in his sister; Diabetes in his sister; Heart attack in his father; Heart failure in his brother; Stroke in his brother and mother.   ROS:   Please see the history of present illness.    All other systems are reviewed and are negative.   PHYSICAL EXAM:   VS:  BP (!) 112/58 (BP Location: Left Arm, Patient Position: Sitting, Cuff Size: Large)   Pulse 61   Ht 6\' 3"  (1.905 m)   Wt 201 lb (91.2 kg)   SpO2 93%   BMI 25.12 kg/m     General: Alert, oriented x3, no distress, healthy left subclavian pacemaker site Head: no evidence of trauma, PERRL, EOMI, no exophtalmos or lid lag, no myxedema, no xanthelasma; normal ears, nose and oropharynx Neck: normal jugular venous pulsations and no hepatojugular reflux; brisk carotid pulses without delay and no carotid bruits Chest: clear to auscultation, no signs of consolidation by percussion or palpation, normal fremitus, symmetrical and full respiratory excursions Cardiovascular: normal position and quality of the apical impulse, regular rhythm, normal first and paradoxically split second heart sounds, no murmurs, rubs or gallops Abdomen: no tenderness or distention, no masses by palpation, no abnormal pulsatility or arterial bruits, normal bowel sounds, no hepatosplenomegaly Extremities: no clubbing, cyanosis or edema; 2+ radial, ulnar and brachial pulses bilaterally; 2+ right femoral, posterior tibial and dorsalis pedis pulses; 2+ left femoral, posterior tibial and dorsalis pedis pulses; no subclavian or femoral bruits Neurological: grossly nonfocal Psych: Normal mood and affect    Wt Readings from Last 3 Encounters:  09/06/22 201 lb (91.2 kg)  08/24/22 210 lb (95.3 kg)  06/23/22 210 lb (95.3 kg)      Studies/Labs Reviewed:   EKG:  EKG is ordered and  personally reviewed.  It shows atrial ventricular sequential pacing the QRS is broad at 192 ms, QTc 486 ms.  Echocardiogram07/13/2024:  1. Left ventricular ejection fraction, by estimation, is 40 to 45%. The  left ventricle has mildly decreased function. Left ventricular endocardial  border not optimally defined to evaluate regional wall motion. There is  mild left ventricular hypertrophy.   Left ventricular diastolic parameters are consistent with Grade I  diastolic dysfunction (impaired relaxation).   2. Right  ventricular systolic function is normal. The right ventricular  size is normal.   3. The mitral valve is abnormal. Trivial mitral valve regurgitation. No  evidence of mitral stenosis.   4. The tricuspid valve is abnormal. Tricuspid valve regurgitation is  moderate.   5. The aortic valve has an indeterminant number of cusps. There is  moderate calcification of the aortic valve. Aortic valve regurgitation is  not visualized. No aortic stenosis is present.   6. Aortic dilatation noted. There is borderline dilatation of the aortic  root, measuring 38 mm. There is mild dilatation of the ascending aorta,  measuring 41 mm.   7. The inferior vena cava is normal in size with greater than 50%  respiratory variability, suggesting right atrial pressure of 3 mmHg.   Comparison(s): Changes from prior study are noted. LVEF worsened from  normal to 40-45% now.    ASSESSMENT:    1. Typical atrial flutter (HCC)   2. Pericardial effusion   3. RBBB and first degree atrioventricular block   4. Pacemaker   5. Encounter for monitoring diuretic therapy      PLAN:  In order of problems listed above:  Atrial flutter: Is very infrequent.  He had to clear documented events over a period of 5 years and then may be had another 3-hour event in June of this year, although, and able to retrieve the full documentation.  Still think that he is not a good candidate for anticoagulation (the risks  outweigh the benefits).  This is supported by his 2 recent falls in the continued very low burden of arrhythmia.  He has evidence of lacunar strokes on CT, but these have not been clinically symptomatic and he has never had anything that sounds like an embolic event.  Although his CHA2DS2-VASc score is elevated at 2-3 (age, questionable hypertension) He has an unsteady gait.  He is almost 89 years old.  Unless there is a marked increase in the burden of arrhythmia or he has an actual clear focal new neurological event, I would continue treatment with aspirin alone.   Pacemaker: Unavoidably, he has a very high prevalence of ventricular pacing due to significant AV conduction abnormalities.  He is not pacemaker dependent, but has both sinus bradycardia and very long AV conduction times and second-degree AV block.  Even with AAIR-DDDR mode turned on he has virtually 100% RV pacing.  CHF: He has developed some findings of congestive heart failure and his echo does show depressed LV ejection fraction, largely due to dyssynchronous LV activation from RV apical pacing.  Will add the low-dose of spironolactone and reduce his dose of loop diuretic in half, get rid of the potassium supplement, change from metoprolol to tartrate to metoprolol succinate.  I doubt that his blood pressure will allow ARB/ARNI.  Recheck labs in a few weeks. Second-degree AV block: This was asymptomatic for a long time, but eventually led to significant bradycardia requiring pacemaker implantation. PVCs: Frequent asymptomatic, RBBB plus LAFB morphology. Hx peric eff: Etiology never identified, resolved spontaneously.  None was seen on his most recent echocardiogram. Altered mental status: Although the acute changes in mentation that led to his hospitalization last week may have been related to an active urinary tract infection, it appears that his mental decline and physical decline began much earlier.  I wonder whether he is developing  dementia.  He does have some associated gait changes and appears to have sundowning.  He has never been very communicative and did not talk  almost at all during the office visit today so it is hard to make an assessment.  Consider neurological evaluation.     Medication Adjustments/Labs and Tests Ordered: Current medicines are reviewed at length with the patient today.  Concerns regarding medicines are outlined above.  Medication changes, Labs and Tests ordered today are listed in the Patient Instructions below. Patient Instructions  Medication Instructions:  STOP METOPROLOL TARTRATE STOP POTASSIUM   START METOPROLOL SUCCINATE 25 MG A DAY START SPIRONOLACTONE 12.5 MG EVERY MORNING DECREASE FUROSEMIDE TO 20 MG EVERY MORNING *If you need a refill on your cardiac medications before your next appointment, please call your pharmacy*   Lab Work: BMP- Please return for Blood Work in 1 MONTH. No appointment needed, lab here at the office is open Monday-Friday from 8AM to 4PM and closed daily for lunch from 12:45-1:45.   If you have labs (blood work) drawn today and your tests are completely normal, you will receive your results only by: MyChart Message (if you have MyChart) OR A paper copy in the mail If you have any lab test that is abnormal or we need to change your treatment, we will call you to review the results.  Follow-Up: At [facility], you and your health needs are our priority.  As part of our continuing mission to provide you with exceptional heart care, we have created designated Provider Care Teams.  These Care Teams include your primary Cardiologist (physician) and Advanced Practice Providers (APPs -  Physician Assistants and Nurse Practitioners) who all work together to provide you with the care you need, when you need it.  We recommend signing up for the patient portal called "MyChart".  Sign up information is provided on this After Visit Summary.  MyChart is used to  connect with patients for Virtual Visits (Telemedicine).  Patients are able to view lab/test results, encounter notes, upcoming appointments, etc.  Non-urgent messages can be sent to your provider as well.   To learn more about what you can do with MyChart, go to [email or web address].    Your next appointment:   4 month(s)  Provider:   Thurmon Fair, MD       Signed, Thurmon Fair, MD  09/06/2022 7:17 PM    [facility] [address] [place], [place], [place]  [address] Phone: [phone]; Fax: [phone]

## 2022-09-18 ENCOUNTER — Ambulatory Visit (INDEPENDENT_AMBULATORY_CARE_PROVIDER_SITE_OTHER): Payer: Medicare Other

## 2022-09-18 ENCOUNTER — Encounter: Payer: Self-pay | Admitting: Cardiovascular Disease

## 2022-09-18 DIAGNOSIS — I441 Atrioventricular block, second degree: Secondary | ICD-10-CM | POA: Diagnosis not present

## 2022-10-04 NOTE — Progress Notes (Signed)
Remote pacemaker transmission.   

## 2022-11-05 ENCOUNTER — Other Ambulatory Visit: Payer: Self-pay

## 2022-11-05 ENCOUNTER — Inpatient Hospital Stay (HOSPITAL_COMMUNITY)
Admission: EM | Admit: 2022-11-05 | Discharge: 2022-11-12 | DRG: 871 | Disposition: A | Discharge status: Hospice | Payer: Medicare Other | Attending: Family Medicine | Admitting: Family Medicine

## 2022-11-05 ENCOUNTER — Emergency Department (HOSPITAL_COMMUNITY): Payer: Medicare Other

## 2022-11-05 ENCOUNTER — Encounter (HOSPITAL_COMMUNITY): Payer: Self-pay

## 2022-11-05 DIAGNOSIS — J690 Pneumonitis due to inhalation of food and vomit: Secondary | ICD-10-CM | POA: Diagnosis present

## 2022-11-05 DIAGNOSIS — Z833 Family history of diabetes mellitus: Secondary | ICD-10-CM

## 2022-11-05 DIAGNOSIS — F03A2 Unspecified dementia, mild, with psychotic disturbance: Secondary | ICD-10-CM | POA: Diagnosis present

## 2022-11-05 DIAGNOSIS — I10 Essential (primary) hypertension: Secondary | ICD-10-CM | POA: Diagnosis present

## 2022-11-05 DIAGNOSIS — D539 Nutritional anemia, unspecified: Secondary | ICD-10-CM | POA: Diagnosis not present

## 2022-11-05 DIAGNOSIS — E441 Mild protein-calorie malnutrition: Secondary | ICD-10-CM | POA: Diagnosis present

## 2022-11-05 DIAGNOSIS — R680 Hypothermia, not associated with low environmental temperature: Secondary | ICD-10-CM | POA: Diagnosis present

## 2022-11-05 DIAGNOSIS — Z9841 Cataract extraction status, right eye: Secondary | ICD-10-CM

## 2022-11-05 DIAGNOSIS — Z7982 Long term (current) use of aspirin: Secondary | ICD-10-CM

## 2022-11-05 DIAGNOSIS — Z66 Do not resuscitate: Secondary | ICD-10-CM | POA: Diagnosis present

## 2022-11-05 DIAGNOSIS — I110 Hypertensive heart disease with heart failure: Secondary | ICD-10-CM | POA: Diagnosis present

## 2022-11-05 DIAGNOSIS — Z8249 Family history of ischemic heart disease and other diseases of the circulatory system: Secondary | ICD-10-CM

## 2022-11-05 DIAGNOSIS — R338 Other retention of urine: Secondary | ICD-10-CM | POA: Diagnosis present

## 2022-11-05 DIAGNOSIS — K5790 Diverticulosis of intestine, part unspecified, without perforation or abscess without bleeding: Secondary | ICD-10-CM | POA: Diagnosis present

## 2022-11-05 DIAGNOSIS — T68XXXA Hypothermia, initial encounter: Secondary | ICD-10-CM | POA: Diagnosis present

## 2022-11-05 DIAGNOSIS — Z881 Allergy status to other antibiotic agents status: Secondary | ICD-10-CM

## 2022-11-05 DIAGNOSIS — Z79899 Other long term (current) drug therapy: Secondary | ICD-10-CM

## 2022-11-05 DIAGNOSIS — Z823 Family history of stroke: Secondary | ICD-10-CM

## 2022-11-05 DIAGNOSIS — A419 Sepsis, unspecified organism: Secondary | ICD-10-CM | POA: Diagnosis not present

## 2022-11-05 DIAGNOSIS — I5032 Chronic diastolic (congestive) heart failure: Secondary | ICD-10-CM | POA: Diagnosis present

## 2022-11-05 DIAGNOSIS — I4891 Unspecified atrial fibrillation: Secondary | ICD-10-CM | POA: Diagnosis present

## 2022-11-05 DIAGNOSIS — G9341 Metabolic encephalopathy: Secondary | ICD-10-CM | POA: Diagnosis present

## 2022-11-05 DIAGNOSIS — H9190 Unspecified hearing loss, unspecified ear: Secondary | ICD-10-CM | POA: Diagnosis present

## 2022-11-05 DIAGNOSIS — I2721 Secondary pulmonary arterial hypertension: Secondary | ICD-10-CM | POA: Diagnosis present

## 2022-11-05 DIAGNOSIS — Z96641 Presence of right artificial hip joint: Secondary | ICD-10-CM | POA: Diagnosis present

## 2022-11-05 DIAGNOSIS — R54 Age-related physical debility: Secondary | ICD-10-CM | POA: Diagnosis present

## 2022-11-05 DIAGNOSIS — I483 Typical atrial flutter: Secondary | ICD-10-CM | POA: Diagnosis present

## 2022-11-05 DIAGNOSIS — N400 Enlarged prostate without lower urinary tract symptoms: Secondary | ICD-10-CM | POA: Diagnosis present

## 2022-11-05 DIAGNOSIS — R1310 Dysphagia, unspecified: Secondary | ICD-10-CM | POA: Diagnosis present

## 2022-11-05 DIAGNOSIS — Z9049 Acquired absence of other specified parts of digestive tract: Secondary | ICD-10-CM

## 2022-11-05 DIAGNOSIS — R6520 Severe sepsis without septic shock: Secondary | ICD-10-CM | POA: Diagnosis present

## 2022-11-05 DIAGNOSIS — I441 Atrioventricular block, second degree: Secondary | ICD-10-CM | POA: Diagnosis present

## 2022-11-05 DIAGNOSIS — Z8673 Personal history of transient ischemic attack (TIA), and cerebral infarction without residual deficits: Secondary | ICD-10-CM

## 2022-11-05 DIAGNOSIS — Z6826 Body mass index (BMI) 26.0-26.9, adult: Secondary | ICD-10-CM

## 2022-11-05 DIAGNOSIS — N401 Enlarged prostate with lower urinary tract symptoms: Secondary | ICD-10-CM | POA: Diagnosis present

## 2022-11-05 DIAGNOSIS — N170 Acute kidney failure with tubular necrosis: Secondary | ICD-10-CM | POA: Diagnosis present

## 2022-11-05 DIAGNOSIS — I700 Atherosclerosis of aorta: Secondary | ICD-10-CM | POA: Diagnosis present

## 2022-11-05 DIAGNOSIS — R5383 Other fatigue: Principal | ICD-10-CM

## 2022-11-05 DIAGNOSIS — Z45018 Encounter for adjustment and management of other part of cardiac pacemaker: Secondary | ICD-10-CM

## 2022-11-05 DIAGNOSIS — N489 Disorder of penis, unspecified: Secondary | ICD-10-CM | POA: Diagnosis present

## 2022-11-05 DIAGNOSIS — Z9842 Cataract extraction status, left eye: Secondary | ICD-10-CM

## 2022-11-05 DIAGNOSIS — Z880 Allergy status to penicillin: Secondary | ICD-10-CM

## 2022-11-05 LAB — HEPATIC FUNCTION PANEL
ALT: 19 U/L (ref 0–44)
AST: 23 U/L (ref 15–41)
Albumin: 3.4 g/dL — ABNORMAL LOW (ref 3.5–5.0)
Alkaline Phosphatase: 107 U/L (ref 38–126)
Bilirubin, Direct: 0.1 mg/dL (ref 0.0–0.2)
Indirect Bilirubin: 0.5 mg/dL (ref 0.3–0.9)
Total Bilirubin: 0.6 mg/dL (ref 0.3–1.2)
Total Protein: 7.3 g/dL (ref 6.5–8.1)

## 2022-11-05 LAB — BLOOD GAS, VENOUS
Acid-Base Excess: 1.2 mmol/L (ref 0.0–2.0)
Bicarbonate: 26.6 mmol/L (ref 20.0–28.0)
O2 Saturation: 98 %
Patient temperature: 37
pCO2, Ven: 44 mmHg (ref 44–60)
pH, Ven: 7.39 (ref 7.25–7.43)
pO2, Ven: 72 mmHg — ABNORMAL HIGH (ref 32–45)

## 2022-11-05 LAB — I-STAT CHEM 8, ED
BUN: 38 mg/dL — ABNORMAL HIGH (ref 8–23)
Calcium, Ion: 1.26 mmol/L (ref 1.15–1.40)
Chloride: 105 mmol/L (ref 98–111)
Creatinine, Ser: 1.3 mg/dL — ABNORMAL HIGH (ref 0.61–1.24)
Glucose, Bld: 89 mg/dL (ref 70–99)
HCT: 37 % — ABNORMAL LOW (ref 39.0–52.0)
Hemoglobin: 12.6 g/dL — ABNORMAL LOW (ref 13.0–17.0)
Potassium: 4.9 mmol/L (ref 3.5–5.1)
Sodium: 138 mmol/L (ref 135–145)
TCO2: 25 mmol/L (ref 22–32)

## 2022-11-05 LAB — CBC WITH DIFFERENTIAL/PLATELET
Abs Immature Granulocytes: 0.03 10*3/uL (ref 0.00–0.07)
Basophils Absolute: 0 10*3/uL (ref 0.0–0.1)
Basophils Relative: 0 %
Eosinophils Absolute: 0.1 10*3/uL (ref 0.0–0.5)
Eosinophils Relative: 2 %
HCT: 36.6 % — ABNORMAL LOW (ref 39.0–52.0)
Hemoglobin: 11.9 g/dL — ABNORMAL LOW (ref 13.0–17.0)
Immature Granulocytes: 1 %
Lymphocytes Relative: 38 %
Lymphs Abs: 2.1 10*3/uL (ref 0.7–4.0)
MCH: 33 pg (ref 26.0–34.0)
MCHC: 32.5 g/dL (ref 30.0–36.0)
MCV: 101.4 fL — ABNORMAL HIGH (ref 80.0–100.0)
Monocytes Absolute: 0.4 10*3/uL (ref 0.1–1.0)
Monocytes Relative: 8 %
Neutro Abs: 2.8 10*3/uL (ref 1.7–7.7)
Neutrophils Relative %: 51 %
Platelets: 161 10*3/uL (ref 150–400)
RBC: 3.61 MIL/uL — ABNORMAL LOW (ref 4.22–5.81)
RDW: 15.4 % (ref 11.5–15.5)
WBC: 5.4 10*3/uL (ref 4.0–10.5)
nRBC: 0 % (ref 0.0–0.2)

## 2022-11-05 LAB — BASIC METABOLIC PANEL
Anion gap: 9 (ref 5–15)
BUN: 43 mg/dL — ABNORMAL HIGH (ref 8–23)
CO2: 25 mmol/L (ref 22–32)
Calcium: 9.3 mg/dL (ref 8.9–10.3)
Chloride: 102 mmol/L (ref 98–111)
Creatinine, Ser: 1.22 mg/dL (ref 0.61–1.24)
GFR, Estimated: 53 mL/min — ABNORMAL LOW (ref >60)
Glucose, Bld: 89 mg/dL (ref 70–99)
Potassium: 4.8 mmol/L (ref 3.5–5.1)
Sodium: 136 mmol/L (ref 135–145)

## 2022-11-05 LAB — TSH: TSH: 3.246 u[IU]/mL (ref 0.350–4.500)

## 2022-11-05 LAB — URINALYSIS, W/ REFLEX TO CULTURE (INFECTION SUSPECTED)
Bacteria, UA: NONE SEEN
Bilirubin Urine: NEGATIVE
Glucose, UA: NEGATIVE mg/dL
Hgb urine dipstick: NEGATIVE
Ketones, ur: NEGATIVE mg/dL
Nitrite: NEGATIVE
Protein, ur: NEGATIVE mg/dL
Specific Gravity, Urine: 1.008 (ref 1.005–1.030)
pH: 6 (ref 5.0–8.0)

## 2022-11-05 LAB — I-STAT CG4 LACTIC ACID, ED: Lactic Acid, Venous: 0.5 mmol/L (ref 0.5–1.9)

## 2022-11-05 LAB — AMMONIA: Ammonia: 19 umol/L (ref 9–35)

## 2022-11-05 LAB — TROPONIN I (HIGH SENSITIVITY): Troponin I (High Sensitivity): 11 ng/L (ref <18)

## 2022-11-05 LAB — BRAIN NATRIURETIC PEPTIDE: B Natriuretic Peptide: 203.2 pg/mL — ABNORMAL HIGH (ref 0.0–100.0)

## 2022-11-05 LAB — CBG MONITORING, ED: Glucose-Capillary: 84 mg/dL (ref 70–99)

## 2022-11-05 LAB — MAGNESIUM: Magnesium: 2.3 mg/dL (ref 1.7–2.4)

## 2022-11-05 MED ORDER — SODIUM CHLORIDE 0.9 % IV SOLN
2.0000 g | Freq: Once | INTRAVENOUS | Status: AC
  Administered 2022-11-05: 2 g via INTRAVENOUS
  Filled 2022-11-05: qty 12.5

## 2022-11-05 MED ORDER — IOHEXOL 350 MG/ML SOLN
80.0000 mL | Freq: Once | INTRAVENOUS | Status: AC | PRN
  Administered 2022-11-05: 80 mL via INTRAVENOUS

## 2022-11-05 MED ORDER — METRONIDAZOLE 500 MG/100ML IV SOLN
500.0000 mg | Freq: Once | INTRAVENOUS | Status: AC
  Administered 2022-11-06: 500 mg via INTRAVENOUS
  Filled 2022-11-05: qty 100

## 2022-11-05 MED ORDER — VANCOMYCIN HCL IN DEXTROSE 1-5 GM/200ML-% IV SOLN
1000.0000 mg | Freq: Once | INTRAVENOUS | Status: DC
  Filled 2022-11-05: qty 200

## 2022-11-05 MED ORDER — LACTATED RINGERS IV SOLN: INTRAVENOUS | Status: AC

## 2022-11-05 MED ORDER — VANCOMYCIN HCL 2000 MG/400ML IV SOLN
2000.0000 mg | Freq: Once | INTRAVENOUS | Status: AC
  Administered 2022-11-06: 2000 mg via INTRAVENOUS
  Filled 2022-11-05: qty 400

## 2022-11-05 MED ORDER — LACTATED RINGERS IV BOLUS
500.0000 mL | Freq: Once | INTRAVENOUS | Status: AC
  Administered 2022-11-05: 500 mL via INTRAVENOUS

## 2022-11-05 NOTE — ED Triage Notes (Signed)
Pt arrived from home via [facility] pt denies any concern, per family EMS called out because pt is fatigued. Per family pt fell asleep while eating lunch today. Pt is  A&O x 4

## 2022-11-05 NOTE — ED Provider Notes (Signed)
Care assumed from Dr. Durwin Nora, patient presented with hypothermia, presumed septic with no clear source of infection.  CT scans of chest and abdomen and pelvis are pending, will need to be admitted following those.  CT scans showed no obvious source of infection or trauma.  I have independently viewed the images, and agree with the radiologist's interpretation.  Incidental finding of fluid collection in the corpus spongiosum of the penis, may need urology evaluation.  Family member was here states patient is still much more lethargic than normal.  He will need to be admitted.  I have discussed case with Dr. Arlean Hopping of [facility], who agrees to admit the patient.  He will need urology consultation, but I was unable to send a secure chat to the on-call urologist.  Results for orders placed or performed during the hospital encounter of 11/05/22  CBC WITH DIFFERENTIAL  Result Value Ref Range   WBC 5.4 4.0 - 10.5 K/uL   RBC 3.61 (L) 4.22 - 5.81 MIL/uL   Hemoglobin 11.9 (L) 13.0 - 17.0 g/dL   HCT 69.4 (L) 85.4 - 62.7 %   MCV 101.4 (H) 80.0 - 100.0 fL   MCH 33.0 26.0 - 34.0 pg   MCHC 32.5 30.0 - 36.0 g/dL   RDW 03.5 00.9 - 38.1 %   Platelets 161 150 - 400 K/uL   nRBC 0.0 0.0 - 0.2 %   Neutrophils Relative % 51 %   Neutro Abs 2.8 1.7 - 7.7 K/uL   Lymphocytes Relative 38 %   Lymphs Abs 2.1 0.7 - 4.0 K/uL   Monocytes Relative 8 %   Monocytes Absolute 0.4 0.1 - 1.0 K/uL   Eosinophils Relative 2 %   Eosinophils Absolute 0.1 0.0 - 0.5 K/uL   Basophils Relative 0 %   Basophils Absolute 0.0 0.0 - 0.1 K/uL   Immature Granulocytes 1 %   Abs Immature Granulocytes 0.03 0.00 - 0.07 K/uL  Basic metabolic panel  Result Value Ref Range   Sodium 136 135 - 145 mmol/L   Potassium 4.8 3.5 - 5.1 mmol/L   Chloride 102 98 - 111 mmol/L   CO2 25 22 - 32 mmol/L   Glucose, Bld 89 70 - 99 mg/dL   BUN 43 (H) 8 - 23 mg/dL   Creatinine, Ser 8.29 0.61 - 1.24 mg/dL   Calcium 9.3 8.9 - 93.7 mg/dL   GFR,  Estimated 53 (L) >60 mL/min   Anion gap 9 5 - 15  Ammonia  Result Value Ref Range   Ammonia 19 9 - 35 umol/L  Hepatic function panel  Result Value Ref Range   Total Protein 7.3 6.5 - 8.1 g/dL   Albumin 3.4 (L) 3.5 - 5.0 g/dL   AST 23 15 - 41 U/L   ALT 19 0 - 44 U/L   Alkaline Phosphatase 107 38 - 126 U/L   Total Bilirubin 0.6 0.3 - 1.2 mg/dL   Bilirubin, Direct 0.1 0.0 - 0.2 mg/dL   Indirect Bilirubin 0.5 0.3 - 0.9 mg/dL  Magnesium  Result Value Ref Range   Magnesium 2.3 1.7 - 2.4 mg/dL  Urinalysis, w/ Reflex to Culture (Infection Suspected) -Urine, Clean Catch  Result Value Ref Range   Specimen Source URINE, CLEAN CATCH    Color, Urine STRAW (A) YELLOW   APPearance CLEAR CLEAR   Specific Gravity, Urine 1.008 1.005 - 1.030   pH 6.0 5.0 - 8.0   Glucose, UA NEGATIVE NEGATIVE mg/dL   Hgb urine dipstick NEGATIVE NEGATIVE  Bilirubin Urine NEGATIVE NEGATIVE   Ketones, ur NEGATIVE NEGATIVE mg/dL   Protein, ur NEGATIVE NEGATIVE mg/dL   Nitrite NEGATIVE NEGATIVE   Leukocytes,Ua SMALL (A) NEGATIVE   RBC / HPF 0-5 0 - 5 RBC/hpf   WBC, UA 0-5 0 - 5 WBC/hpf   Bacteria, UA NONE SEEN NONE SEEN   Squamous Epithelial / HPF 0-5 0 - 5 /HPF   Mucus PRESENT    Hyaline Casts, UA PRESENT   Brain natriuretic peptide  Result Value Ref Range   B Natriuretic Peptide 203.2 (H) 0.0 - 100.0 pg/mL  TSH  Result Value Ref Range   TSH 3.246 0.350 - 4.500 uIU/mL  Blood gas, venous  Result Value Ref Range   pH, Ven 7.39 7.25 - 7.43   pCO2, Ven 44 44 - 60 mmHg   pO2, Ven 72 (H) 32 - 45 mmHg   Bicarbonate 26.6 20.0 - 28.0 mmol/L   Acid-Base Excess 1.2 0.0 - 2.0 mmol/L   O2 Saturation 98 %   Patient temperature 37.0   CK  Result Value Ref Range   Total CK 47 (L) 49 - 397 U/L  CBG monitoring, ED  Result Value Ref Range   Glucose-Capillary 84 70 - 99 mg/dL  I-Stat Chem 8, ED  Result Value Ref Range   Sodium 138 135 - 145 mmol/L   Potassium 4.9 3.5 - 5.1 mmol/L   Chloride 105 98 - 111 mmol/L    BUN 38 (H) 8 - 23 mg/dL   Creatinine, Ser 1.32 (H) 0.61 - 1.24 mg/dL   Glucose, Bld 89 70 - 99 mg/dL   Calcium, Ion 4.40 1.02 - 1.40 mmol/L   TCO2 25 22 - 32 mmol/L   Hemoglobin 12.6 (L) 13.0 - 17.0 g/dL   HCT 72.5 (L) 36.6 - 44.0 %  I-Stat Lactic Acid, ED  Result Value Ref Range   Lactic Acid, Venous 0.5 0.5 - 1.9 mmol/L  Troponin I (High Sensitivity)  Result Value Ref Range   Troponin I (High Sensitivity) 11 <18 ng/L  Troponin I (High Sensitivity)  Result Value Ref Range   Troponin I (High Sensitivity) 11 <18 ng/L   CT Angio Chest PE W and/or Wo Contrast  Result Date: 11/06/2022 CLINICAL DATA:  Altered mental status, sepsis, fatigue. Pulmonary embolism (PE) suspected, high prob; Abdominal pain, acute, nonlocalized EXAM: CT ANGIOGRAPHY CHEST CT ABDOMEN AND PELVIS WITH CONTRAST TECHNIQUE: Multidetector CT imaging of the chest was performed using the standard protocol during bolus administration of intravenous contrast. Multiplanar CT image reconstructions and MIPs were obtained to evaluate the vascular anatomy. Multidetector CT imaging of the abdomen and pelvis was performed using the standard protocol during bolus administration of intravenous contrast. RADIATION DOSE REDUCTION: This exam was performed according to the departmental dose-optimization program which includes automated exposure control, adjustment of the mA and/or kV according to patient size and/or use of iterative reconstruction technique. CONTRAST:  80mL OMNIPAQUE IOHEXOL 350 MG/ML SOLN COMPARISON:  CT chest 06/23/2022, CT abdomen pelvis 06/22/2020 FINDINGS: CTA CHEST FINDINGS Cardiovascular: There is adequate opacification of the pulmonary arterial tree. No intraluminal filling defect identified to suggest acute pulmonary embolism. The central pulmonary arteries are enlarged in keeping with changes of pulmonary arterial hypertension. Mild coronary artery calcification. Mild global cardiomegaly. Left subclavian pacemaker is in  place with leads within the right atrium and right ventricle toward the apex. No pericardial effusion. The thoracic aorta demonstrates fusiform dilation measuring 4.1 cm in maximal diameter within the ascending aorta, 4.0 cm in  diameter within the proximal descending aorta just distal to the arch, and 3.3 cm in diameter distally at the level of the left atrium. Mild atherosclerotic calcification within the thoracic aorta. Mediastinum/Nodes: Visualized thyroid is unremarkable. No pathologic thoracic adenopathy. Esophagus unremarkable. Small hiatal hernia. Lungs/Pleura: Mild bibasilar atelectasis. Mild biapical subpleural minimal fibrotic change. Lungs are otherwise clear. No pneumothorax. Trace bilateral pleural effusions. Ovoid gas and soft tissue density within the right mainstem bronchus measuring 17 mm at axial image # 60/12 is new from prior examination and may represent adherent mucus though an aspirated foreign body could appear similarly. Musculoskeletal: Acid structures are age-appropriate. No acute bone abnormality. No lytic or blastic bone lesion. Review of the MIP images confirms the above findings. CT ABDOMEN and PELVIS FINDINGS Hepatobiliary: Status post cholecystectomy. Nondependent pneumobilia likely relate to changes of sphincterotomy. No intra or extrahepatic biliary ductal dilation. Liver otherwise unremarkable. Pancreas: Unremarkable Spleen: Unremarkable Adrenals/Urinary Tract: Adrenal glands are unremarkable. Kidneys are normal, without renal calculi, focal lesion, or hydronephrosis. Bladder is unremarkable. Stomach/Bowel: Moderate to severe pancolonic diverticulosis, most severe within the sigmoid colon, without superimposed acute inflammatory change. The stomach, and large bowel are otherwise unremarkable. No evidence of obstruction. No free intraperitoneal gas or fluid. Appendectomy has been performed. Vascular/Lymphatic: Aortic atherosclerosis. No enlarged abdominal or pelvic lymph nodes.  Reproductive: Marked prostatic hypertrophy. Tubular gas and fluid collection has developed within the corpus spongiosum of the penis measuring up to 15 mm in diameter and extending along the course of the expected penile and bulbar urethra, new since prior examination. This may represent dilation of the a urethra secondary to a peripheral stricture within the penile urethra, a false lumen potentially related to traumatic catheterization, or a penile abscess. Other: No abdominal wall hernia or abnormality. No abdominopelvic ascites. Musculoskeletal: Right total hip arthroplasty has been performed. Osseous structures are age-appropriate. No acute bone abnormality. No lytic or blastic bone lesion. Review of the MIP images confirms the above findings. IMPRESSION: 1. No pulmonary embolism. 2. Pulmonary arterial hypertension. 3. Mild coronary artery calcification. Mild global cardiomegaly. 4. Trace bilateral pleural effusions. 5. Ovoid gas and soft tissue density within the right mainstem bronchus measuring 17 mm is new from prior examination and may represent adherent mucus though an aspirated foreign body could appear similarly. 6. Moderate to severe pancolonic diverticulosis without superimposed acute inflammatory change. 7. Marked prostatic hypertrophy. 8. Tubular fluid collection within the corpus spongiosum of the penis, new since prior examination, in keeping with a distended anterior urethra, false lumen secondary to traumatic catheterization, or a penile abscess. Aortic Atherosclerosis (ICD10-I70.0). Electronically Signed   By: Helyn Numbers M.D.   On: 11/06/2022 01:15   CT ABDOMEN PELVIS W CONTRAST  Result Date: 11/06/2022 CLINICAL DATA:  Altered mental status, sepsis, fatigue. Pulmonary embolism (PE) suspected, high prob; Abdominal pain, acute, nonlocalized EXAM: CT ANGIOGRAPHY CHEST CT ABDOMEN AND PELVIS WITH CONTRAST TECHNIQUE: Multidetector CT imaging of the chest was performed using the standard  protocol during bolus administration of intravenous contrast. Multiplanar CT image reconstructions and MIPs were obtained to evaluate the vascular anatomy. Multidetector CT imaging of the abdomen and pelvis was performed using the standard protocol during bolus administration of intravenous contrast. RADIATION DOSE REDUCTION: This exam was performed according to the departmental dose-optimization program which includes automated exposure control, adjustment of the mA and/or kV according to patient size and/or use of iterative reconstruction technique. CONTRAST:  80mL OMNIPAQUE IOHEXOL 350 MG/ML SOLN COMPARISON:  CT chest 06/23/2022, CT abdomen pelvis 06/22/2020 FINDINGS:  CTA CHEST FINDINGS Cardiovascular: There is adequate opacification of the pulmonary arterial tree. No intraluminal filling defect identified to suggest acute pulmonary embolism. The central pulmonary arteries are enlarged in keeping with changes of pulmonary arterial hypertension. Mild coronary artery calcification. Mild global cardiomegaly. 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 No pericardial effusion. The thoracic aorta demonstrates fusiform dilation measuring 4.1 cm in maximal diameter within the ascending aorta, 4.0 cm in diameter within the proximal descending aorta just distal to the arch, and 3.3 cm in diameter distally at the level of the left atrium. Mild atherosclerotic calcification within the thoracic aorta. Mediastinum/Nodes: Visualized thyroid is unremarkable. No pathologic thoracic adenopathy. Esophagus unremarkable. Small hiatal hernia. Lungs/Pleura: Mild bibasilar atelectasis. Mild biapical subpleural minimal fibrotic change. Lungs are otherwise clear. No pneumothorax. Trace bilateral pleural effusions. Ovoid gas and soft tissue density within the right mainstem bronchus measuring 17 mm at axial image # 60/12 is new from prior examination and may represent  adherent mucus though an aspirated foreign body could appear similarly. Musculoskeletal: Acid structures are age-appropriate. No acute bone abnormality. No lytic or blastic bone lesion. Review of the MIP images confirms the above findings. CT ABDOMEN and PELVIS FINDINGS Hepatobiliary: Status post cholecystectomy. Nondependent pneumobilia likely relate to changes of sphincterotomy. No intra or extrahepatic biliary ductal dilation. Liver otherwise unremarkable. Pancreas: Unremarkable Spleen: Unremarkable Adrenals/Urinary Tract: Adrenal glands are unremarkable. Kidneys are normal, without renal calculi, focal lesion, or hydronephrosis. Bladder is unremarkable. Stomach/Bowel: Moderate to severe pancolonic diverticulosis, most severe within the sigmoid colon, without superimposed acute inflammatory change. The stomach, and large bowel are otherwise unremarkable. No evidence of obstruction. No free intraperitoneal gas or fluid. Appendectomy has been performed. Vascular/Lymphatic: Aortic atherosclerosis. No enlarged abdominal or pelvic lymph nodes. Reproductive: Marked prostatic hypertrophy. Tubular gas and fluid collection has developed within the corpus spongiosum of the penis measuring up to 15 mm in diameter and extending along the course of the expected penile and bulbar urethra, new since prior examination. This may represent dilation of the a urethra secondary to a peripheral stricture within the penile urethra, a false lumen potentially related to traumatic catheterization, or a penile abscess. Other: No abdominal wall hernia or abnormality. No abdominopelvic ascites. Musculoskeletal: Right total hip arthroplasty has been performed. Osseous structures are age-appropriate. No acute bone abnormality. No lytic or blastic bone lesion. Review of the MIP images confirms the above findings. IMPRESSION: 1. No pulmonary embolism. 2. Pulmonary arterial hypertension. 3. Mild coronary artery calcification. Mild global  cardiomegaly. 4. Trace bilateral pleural effusions. 5. Ovoid gas and soft tissue density within the right mainstem bronchus measuring 17 mm is new from prior examination and may represent adherent mucus though an aspirated foreign body could appear similarly. 6. Moderate to severe pancolonic diverticulosis without superimposed acute inflammatory change. 7. Marked prostatic hypertrophy. 8. Tubular fluid collection within the corpus spongiosum of the penis, new since prior examination, in keeping with a distended anterior urethra, false lumen secondary to traumatic catheterization, or a penile abscess. Aortic Atherosclerosis (ICD10-I70.0). Electronically Signed   By: Helyn Numbers M.D.   On: 11/06/2022 01:15   CT HEAD WO CONTRAST  Result Date: 11/05/2022 CLINICAL DATA:  Mental status change EXAM: CT HEAD WITHOUT CONTRAST TECHNIQUE: Contiguous axial images were obtained from the base of the skull through the vertex without intravenous contrast. RADIATION DOSE REDUCTION: This exam was performed according to the departmental dose-optimization program which includes automated exposure control, adjustment of  the mA and/or kV according to patient size and/or use of iterative reconstruction technique. COMPARISON:  Head CT 08/24/2022 FINDINGS: Brain: No evidence of acute infarction, hemorrhage, hydrocephalus, extra-axial collection or mass lesion/mass effect. Again seen is mild diffuse atrophy. There is an old lacunar infarct in the left corona radiata and basal ganglia, unchanged. Vascular: No hyperdense vessel or unexpected calcification. Skull: Normal. Negative for fracture or focal lesion. Sinuses/Orbits: No acute finding. Other: None. IMPRESSION: 1. No acute intracranial abnormality. 2. Mild diffuse atrophy. 3. 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 Electronically Signed   By: Darliss Cheney M.D.   On: 11/05/2022 22:21      Dione Booze, MD 11/06/22 [phone]

## 2022-11-05 NOTE — ED Provider Notes (Signed)
Terry Gregory Provider Note   CSN: [id number] Arrival date & time: 11/05/22  1948     History  Chief Complaint  Patient presents with   Fatigue    Terry Gregory is a 89 y.o. male.  HPI Patient presents for altered mental status.  Medical history includes HTN, atrial flutter, RBBB, BPH.  He had a Gregory admission 2 months ago for encephalopathy.  This was suspected to be secondary to UTI and CHF exacerbation.  His daughter, who stays with him, states that he has had intermittent confusion and hallucinations since his Gregory admission 2 months ago.  These worsened over the past 2 days.  He was given a prescription for Seroquel to take as needed.  He got his first dose of this last night.  He was up most of the night in bed, hallucinating.  He ultimately fell asleep at around 5 AM.  He slept till around 11:30 AM.  Since then, he has not wanted to get out of bed.  He was falling asleep while eating lunch.  Typically, he is active.  He ambulates with a walker.  He does have some memory deficits at baseline.  Patient, himself, denies any current areas of pain.  Daughter denies any other new medication changes    Home Medications Prior to Admission medications   Medication Sig Start Date End Date Taking? Authorizing Provider  aspirin EC 81 MG tablet Take 81 mg by mouth daily.    [provider]  finasteride (PROSCAR) 5 MG tablet Take 5 mg by mouth daily. 09/27/17   [provider]  furosemide (LASIX) 20 MG tablet Take 1 tablet (20 mg total) by mouth every morning. 09/06/22   Gregory, Mihai, MD  metoprolol succinate (TOPROL XL) 25 MG 24 hr tablet Take 1 tablet (25 mg total) by mouth daily. 09/06/22   Gregory, Mihai, MD  omeprazole (PRILOSEC) 20 MG capsule Take 20 mg by mouth daily.    [provider]  spironolactone (ALDACTONE) 25 MG tablet Take 0.5 tablets (12.5 mg total) by mouth daily. 09/06/22 12/05/22  Gregory, Mihai,  MD      Allergies    Erythromycin and Penicillins    Review of Systems   Review of Systems  Unable to perform ROS: Mental status change  Constitutional:  Positive for fatigue.  Psychiatric/Behavioral:  Positive for confusion and hallucinations.     Physical Exam Updated Vital Signs BP (!) 145/88   Pulse 60   Temp (!) 92.6 F (33.7 C) (Rectal)   Resp 16   SpO2 97%  Physical Exam Vitals and nursing note reviewed.  Constitutional:      General: He is not in acute distress.    Appearance: He is well-developed and normal weight. He is not toxic-appearing or diaphoretic.  HENT:     Head: Normocephalic and atraumatic.     Right Ear: External ear normal.     Left Ear: External ear normal.     Nose: Nose normal.     Mouth/Throat:     Mouth: Mucous membranes are moist.  Eyes:     Extraocular Movements: Extraocular movements intact.     Conjunctiva/sclera: Conjunctivae normal.  Cardiovascular:     Rate and Rhythm: Normal rate and regular rhythm.     Heart sounds: No murmur heard. Pulmonary:     Effort: Pulmonary effort is normal. No respiratory distress.     Breath sounds: Normal breath sounds. No wheezing or rales.  Chest:  Chest wall: No tenderness.  Abdominal:     General: There is no distension.     Palpations: Abdomen is soft.     Tenderness: There is no abdominal tenderness.  Musculoskeletal:        General: No swelling or deformity.     Cervical back: Normal range of motion and neck supple.     Right lower leg: No edema.     Left lower leg: No edema.  Skin:    General: Skin is warm and dry.     Coloration: Skin is not jaundiced or pale.  Neurological:     General: No focal deficit present.     Mental Status: He is alert.  Psychiatric:        Speech: Speech is delayed.        Behavior: Behavior is slowed and withdrawn. Behavior is cooperative.     ED Results / Procedures / Treatments   Labs (all labs ordered are listed, but only abnormal results are  displayed) Labs Reviewed  CBC WITH DIFFERENTIAL/PLATELET - Abnormal; Notable for the following components:      Result Value   RBC 3.61 (*)    Hemoglobin 11.9 (*)    HCT 36.6 (*)    MCV 101.4 (*)    All other components within normal limits  BASIC METABOLIC PANEL - Abnormal; Notable for the following components:   BUN 43 (*)    GFR, Estimated 53 (*)    All other components within normal limits  HEPATIC FUNCTION PANEL - Abnormal; Notable for the following components:   Albumin 3.4 (*)    All other components within normal limits  URINALYSIS, W/ REFLEX TO CULTURE (INFECTION SUSPECTED) - Abnormal; Notable for the following components:   Color, Urine STRAW (*)    Leukocytes,Ua SMALL (*)    All other components within normal limits  BRAIN NATRIURETIC PEPTIDE - Abnormal; Notable for the following components:   B Natriuretic Peptide 203.2 (*)    All other components within normal limits  BLOOD GAS, VENOUS - Abnormal; Notable for the following components:   pO2, Ven 72 (*)    All other components within normal limits  I-STAT CHEM 8, ED - Abnormal; Notable for the following components:   BUN 38 (*)    Creatinine, Ser 1.30 (*)    Hemoglobin 12.6 (*)    HCT 37.0 (*)    All other components within normal limits  CULTURE, BLOOD (ROUTINE X 2)  CULTURE, BLOOD (ROUTINE X 2)  AMMONIA  MAGNESIUM  TSH  CK  CBG MONITORING, ED  I-STAT CG4 LACTIC ACID, ED  TROPONIN I (HIGH SENSITIVITY)  TROPONIN I (HIGH SENSITIVITY)    EKG EKG Interpretation Date/Time:  Monday November 05 2022 20:00:40 EDT Ventricular Rate:  85 PR Interval:  104 QRS Duration:  210 QT Interval:  467 QTC Calculation: 556 R Axis:   -72  Text Interpretation: VENTRICULAR PACED RHYTHM Confirmed by Gloris Manchester [phone]) on 11/05/2022 9:34:24 PM  Radiology CT HEAD WO CONTRAST  Result Date: 11/05/2022 CLINICAL DATA:  Mental status change EXAM: CT HEAD WITHOUT CONTRAST TECHNIQUE: Contiguous axial images were obtained from the  base of the skull through the vertex without intravenous contrast. RADIATION DOSE REDUCTION: This exam was performed according to the departmental dose-optimization program which includes automated exposure control, adjustment of the mA and/or kV according to patient size and/or use of iterative reconstruction technique. COMPARISON:  Head CT 08/24/2022 FINDINGS: Brain: No evidence of acute infarction, hemorrhage, hydrocephalus, extra-axial collection  or mass lesion/mass effect. Again seen is mild diffuse atrophy. There is an old lacunar infarct in the left corona radiata and basal ganglia, unchanged. Vascular: No hyperdense vessel or unexpected calcification. Skull: Normal. Negative for fracture or focal lesion. Sinuses/Orbits: No acute finding. Other: None. IMPRESSION: 1. No acute intracranial abnormality. 2. Mild diffuse atrophy. 3. 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 Electronically Signed   By: Darliss Cheney M.D.   On: 11/05/2022 22:21    Procedures Procedures    Medications Ordered in ED Medications  lactated ringers bolus 500 mL (has no administration in time range)  lactated ringers infusion (has no administration in time range)  ceFEPIme (MAXIPIME) 2 g in sodium chloride 0.9 % 100 mL IVPB (has no administration in time range)  metroNIDAZOLE (FLAGYL) IVPB 500 mg (has no administration in time range)  vancomycin (VANCOREADY) IVPB 2000 mg/400 mL (has no administration in time range)  iohexol (OMNIPAQUE) 350 MG/ML injection 80 mL (80 mLs Intravenous Contrast Given 11/05/22 2305)    ED Course/ Medical Decision Making/ A&P                                 Medical Decision Making Amount and/or Complexity of Data Reviewed Labs: ordered. Radiology: ordered.  Risk Prescription drug management.   This patient presents to the ED for concern of fatigue and altered mental status, this involves an extensive number of treatment options, and is a complaint  that carries with it a high risk of complications and morbidity.  The differential diagnosis includes infection, polypharmacy, metabolic derangements, progressive neurocognitive disease, deconditioning   Co morbidities that complicate the patient evaluation  HTN, atrial flutter, RBBB, BPH   Additional history obtained:  Additional history obtained from patient's daughter External records from outside source obtained and reviewed including EMR   Lab Tests:  I Ordered, and personally interpreted labs.  The pertinent results include: Baseline creatinine, normal electrolytes, baseline anemia, no leukocytosis, normal ammonia, normal lactate, normal troponin, mildly elevated BNP, no evidence of UTI   Imaging Studies ordered:  I ordered imaging studies including CT of head, CTA chest, CT of abdomen and pelvis I independently visualized and interpreted imaging which showed no acute findings on CT head.  Remaining imaging pending at time of signout. I agree with the radiologist interpretation   Cardiac Monitoring: / EKG:  The patient was maintained on a cardiac monitor.  I personally viewed and interpreted the cardiac monitored which showed an underlying rhythm of: Paced rhythm   Problem List / ED Course / Critical interventions / Medication management  Patient presents to the ED with his daughter for concerns of acute on chronic hallucinations, confusion, in addition to severe fatigue today.  On arrival, vital signs notable for mild hypertension.  Monitor shows ventricular paced rhythm.  Patient does have slowed responses.  He is able to follow commands.  He is able to move all extremities.  He denies any current areas of pain.  Current breathing is unlabored.  Daughter reports that he is on a 81 mg aspirin but not any other blood thinning medications.  Broad workup was initiated.  On check of rectal temperature, patient was found to be hypothermic with a core temperature of 92.6 degrees.   Blanket was applied.  Additional lab work was ordered for concern of sepsis.  Broad-spectrum antibiotics were ordered.  Lab work is largely unremarkable.  While in  the ED, patient underwent pacemaker interrogation.  Results showed that the device is functioning as programmed and that there were no recent arrhythmias.  CT head did not show acute findings.  At time of signout, CTA of chest and CT of abdomen and pelvis were pending.  Care of patient was signed out to oncoming ED provider. I ordered medication including IV fluids for hydration; broad-spectrum antibiotics for hypothermia Reevaluation of the patient after these medicines showed that the patient improved I have reviewed the patients home medicines and have made adjustments as needed   Social Determinants of Health:  Lives at home with daughter         Final Clinical Impression(s) / ED Diagnoses Final diagnoses:  Fatigue, unspecified type  Hypothermia, initial encounter    Rx / DC Orders ED Discharge Orders     None         Gloris Manchester, MD 11/05/22 2347    Gloris Manchester, MD 11/06/22 0006

## 2022-11-05 NOTE — Progress Notes (Signed)
A consult was received from an ED physician for Cefepime + Vancomycin per pharmacy dosing.  The patient's profile has been reviewed for ht/wt/allergies/indication/available labs.   A one time order has been placed for Cefepime 2gm + Vancomycin 2gm IV.  Further antibiotics/pharmacy consults should be ordered by admitting physician if indicated.                       Thank you, Junita Push PharmD 11/05/2022  11:45 PM

## 2022-11-06 ENCOUNTER — Encounter (HOSPITAL_COMMUNITY): Payer: Self-pay | Admitting: Internal Medicine

## 2022-11-06 DIAGNOSIS — I441 Atrioventricular block, second degree: Secondary | ICD-10-CM | POA: Diagnosis present

## 2022-11-06 DIAGNOSIS — I5032 Chronic diastolic (congestive) heart failure: Secondary | ICD-10-CM | POA: Diagnosis present

## 2022-11-06 DIAGNOSIS — I700 Atherosclerosis of aorta: Secondary | ICD-10-CM | POA: Diagnosis present

## 2022-11-06 DIAGNOSIS — I4891 Unspecified atrial fibrillation: Secondary | ICD-10-CM | POA: Diagnosis present

## 2022-11-06 DIAGNOSIS — R54 Age-related physical debility: Secondary | ICD-10-CM | POA: Diagnosis present

## 2022-11-06 DIAGNOSIS — N489 Disorder of penis, unspecified: Secondary | ICD-10-CM | POA: Diagnosis present

## 2022-11-06 DIAGNOSIS — A419 Sepsis, unspecified organism: Secondary | ICD-10-CM | POA: Diagnosis present

## 2022-11-06 DIAGNOSIS — G9341 Metabolic encephalopathy: Secondary | ICD-10-CM | POA: Diagnosis present

## 2022-11-06 DIAGNOSIS — Z66 Do not resuscitate: Secondary | ICD-10-CM | POA: Diagnosis present

## 2022-11-06 DIAGNOSIS — E441 Mild protein-calorie malnutrition: Secondary | ICD-10-CM | POA: Diagnosis present

## 2022-11-06 DIAGNOSIS — Z8249 Family history of ischemic heart disease and other diseases of the circulatory system: Secondary | ICD-10-CM | POA: Diagnosis not present

## 2022-11-06 DIAGNOSIS — Z880 Allergy status to penicillin: Secondary | ICD-10-CM | POA: Diagnosis not present

## 2022-11-06 DIAGNOSIS — I483 Typical atrial flutter: Secondary | ICD-10-CM | POA: Diagnosis present

## 2022-11-06 DIAGNOSIS — R1310 Dysphagia, unspecified: Secondary | ICD-10-CM | POA: Diagnosis present

## 2022-11-06 DIAGNOSIS — J690 Pneumonitis due to inhalation of food and vomit: Secondary | ICD-10-CM | POA: Diagnosis present

## 2022-11-06 DIAGNOSIS — I2721 Secondary pulmonary arterial hypertension: Secondary | ICD-10-CM | POA: Diagnosis present

## 2022-11-06 DIAGNOSIS — Z45018 Encounter for adjustment and management of other part of cardiac pacemaker: Secondary | ICD-10-CM | POA: Diagnosis not present

## 2022-11-06 DIAGNOSIS — Z881 Allergy status to other antibiotic agents status: Secondary | ICD-10-CM | POA: Diagnosis not present

## 2022-11-06 DIAGNOSIS — D539 Nutritional anemia, unspecified: Secondary | ICD-10-CM | POA: Diagnosis present

## 2022-11-06 DIAGNOSIS — N170 Acute kidney failure with tubular necrosis: Secondary | ICD-10-CM | POA: Diagnosis present

## 2022-11-06 DIAGNOSIS — I110 Hypertensive heart disease with heart failure: Secondary | ICD-10-CM | POA: Diagnosis present

## 2022-11-06 DIAGNOSIS — F03A2 Unspecified dementia, mild, with psychotic disturbance: Secondary | ICD-10-CM | POA: Diagnosis present

## 2022-11-06 DIAGNOSIS — T68XXXA Hypothermia, initial encounter: Secondary | ICD-10-CM | POA: Diagnosis present

## 2022-11-06 DIAGNOSIS — R338 Other retention of urine: Secondary | ICD-10-CM | POA: Diagnosis present

## 2022-11-06 DIAGNOSIS — R6520 Severe sepsis without septic shock: Secondary | ICD-10-CM | POA: Diagnosis present

## 2022-11-06 DIAGNOSIS — N401 Enlarged prostate with lower urinary tract symptoms: Secondary | ICD-10-CM | POA: Diagnosis present

## 2022-11-06 LAB — CBC WITH DIFFERENTIAL/PLATELET
Abs Immature Granulocytes: 0.03 10*3/uL (ref 0.00–0.07)
Basophils Absolute: 0 10*3/uL (ref 0.0–0.1)
Basophils Relative: 0 %
Eosinophils Absolute: 0.1 10*3/uL (ref 0.0–0.5)
Eosinophils Relative: 2 %
HCT: 35.9 % — ABNORMAL LOW (ref 39.0–52.0)
Hemoglobin: 11.5 g/dL — ABNORMAL LOW (ref 13.0–17.0)
Immature Granulocytes: 1 %
Lymphocytes Relative: 28 %
Lymphs Abs: 1.5 10*3/uL (ref 0.7–4.0)
MCH: 32.8 pg (ref 26.0–34.0)
MCHC: 32 g/dL (ref 30.0–36.0)
MCV: 102.3 fL — ABNORMAL HIGH (ref 80.0–100.0)
Monocytes Absolute: 0.4 10*3/uL (ref 0.1–1.0)
Monocytes Relative: 7 %
Neutro Abs: 3.3 10*3/uL (ref 1.7–7.7)
Neutrophils Relative %: 62 %
Platelets: 155 10*3/uL (ref 150–400)
RBC: 3.51 MIL/uL — ABNORMAL LOW (ref 4.22–5.81)
RDW: 15.7 % — ABNORMAL HIGH (ref 11.5–15.5)
WBC: 5.4 10*3/uL (ref 4.0–10.5)
nRBC: 0 % (ref 0.0–0.2)

## 2022-11-06 LAB — COMPREHENSIVE METABOLIC PANEL
ALT: 19 U/L (ref 0–44)
AST: 20 U/L (ref 15–41)
Albumin: 3.2 g/dL — ABNORMAL LOW (ref 3.5–5.0)
Alkaline Phosphatase: 94 U/L (ref 38–126)
Anion gap: 12 (ref 5–15)
BUN: 38 mg/dL — ABNORMAL HIGH (ref 8–23)
CO2: 21 mmol/L — ABNORMAL LOW (ref 22–32)
Calcium: 9.1 mg/dL (ref 8.9–10.3)
Chloride: 103 mmol/L (ref 98–111)
Creatinine, Ser: 1.1 mg/dL (ref 0.61–1.24)
GFR, Estimated: >60 mL/min (ref >60)
Glucose, Bld: 74 mg/dL (ref 70–99)
Potassium: 4.8 mmol/L (ref 3.5–5.1)
Sodium: 136 mmol/L (ref 135–145)
Total Bilirubin: 0.8 mg/dL (ref 0.3–1.2)
Total Protein: 6.9 g/dL (ref 6.5–8.1)

## 2022-11-06 LAB — CK: Total CK: 47 U/L — ABNORMAL LOW (ref 49–397)

## 2022-11-06 LAB — PROTIME-INR
INR: 1.1 (ref 0.8–1.2)
Prothrombin Time: 14.1 seconds (ref 11.4–15.2)

## 2022-11-06 LAB — MAGNESIUM: Magnesium: 2.1 mg/dL (ref 1.7–2.4)

## 2022-11-06 LAB — MRSA NEXT GEN BY PCR, NASAL: MRSA by PCR Next Gen: NOT DETECTED

## 2022-11-06 LAB — TROPONIN I (HIGH SENSITIVITY): Troponin I (High Sensitivity): 11 ng/L (ref <18)

## 2022-11-06 MED ORDER — INFLUENZA VAC A&B SURF ANT ADJ 0.5 ML IM SUSY
0.5000 mL | PREFILLED_SYRINGE | INTRAMUSCULAR | Status: DC
  Filled 2022-11-06: qty 0.5

## 2022-11-06 MED ORDER — LINEZOLID 600 MG/300ML IV SOLN: 600.0000 mg | Freq: Two times a day (BID) | INTRAVENOUS | Status: DC

## 2022-11-06 MED ORDER — ASPIRIN 81 MG PO TBEC
81.0000 mg | DELAYED_RELEASE_TABLET | Freq: Every day | ORAL | Status: DC
  Administered 2022-11-07 – 2022-11-11 (×5): 81 mg via ORAL
  Filled 2022-11-06 (×5): qty 1

## 2022-11-06 MED ORDER — CHLORHEXIDINE GLUCONATE CLOTH 2 % EX PADS
6.0000 | MEDICATED_PAD | Freq: Every day | CUTANEOUS | Status: DC
  Administered 2022-11-07 – 2022-11-08 (×3): 6 via TOPICAL

## 2022-11-06 MED ORDER — SODIUM CHLORIDE 0.9 % IV SOLN
1.0000 g | INTRAVENOUS | Status: DC
  Administered 2022-11-06 – 2022-11-08 (×3): 1 g via INTRAVENOUS
  Filled 2022-11-06 (×3): qty 10

## 2022-11-06 MED ORDER — MIDODRINE HCL 5 MG PO TABS: 5.0000 mg | ORAL_TABLET | Freq: Once | ORAL | Status: DC

## 2022-11-06 MED ORDER — METOPROLOL SUCCINATE ER 25 MG PO TB24
25.0000 mg | ORAL_TABLET | Freq: Every day | ORAL | Status: DC
  Administered 2022-11-07 – 2022-11-09 (×3): 25 mg via ORAL
  Filled 2022-11-06 (×3): qty 1

## 2022-11-06 MED ORDER — SPIRONOLACTONE 12.5 MG HALF TABLET
12.5000 mg | ORAL_TABLET | Freq: Every day | ORAL | Status: DC
  Administered 2022-11-07: 12.5 mg via ORAL
  Filled 2022-11-06 (×2): qty 1

## 2022-11-06 MED ORDER — METRONIDAZOLE 500 MG/100ML IV SOLN
500.0000 mg | Freq: Two times a day (BID) | INTRAVENOUS | Status: DC
  Administered 2022-11-06 – 2022-11-07 (×3): 500 mg via INTRAVENOUS
  Filled 2022-11-06 (×3): qty 100

## 2022-11-06 MED ORDER — ACETAMINOPHEN 325 MG PO TABS: 650.0000 mg | ORAL_TABLET | Freq: Four times a day (QID) | ORAL | Status: DC | PRN

## 2022-11-06 MED ORDER — ONDANSETRON HCL 4 MG/2ML IJ SOLN: 4.0000 mg | Freq: Four times a day (QID) | INTRAMUSCULAR | Status: DC | PRN

## 2022-11-06 MED ORDER — SODIUM CHLORIDE 0.9 % IV BOLUS
500.0000 mL | Freq: Once | INTRAVENOUS | Status: AC
  Administered 2022-11-06: 500 mL via INTRAVENOUS

## 2022-11-06 MED ORDER — FINASTERIDE 5 MG PO TABS
5.0000 mg | ORAL_TABLET | Freq: Every day | ORAL | Status: DC
  Administered 2022-11-07 – 2022-11-09 (×3): 5 mg via ORAL
  Filled 2022-11-06 (×3): qty 1

## 2022-11-06 MED ORDER — PANTOPRAZOLE SODIUM 40 MG PO TBEC
40.0000 mg | DELAYED_RELEASE_TABLET | Freq: Every day | ORAL | Status: DC
  Administered 2022-11-07 – 2022-11-09 (×3): 40 mg via ORAL
  Filled 2022-11-06 (×3): qty 1

## 2022-11-06 MED ORDER — ACETAMINOPHEN 650 MG RE SUPP: 650.0000 mg | Freq: Four times a day (QID) | RECTAL | Status: DC | PRN

## 2022-11-06 MED ORDER — ORAL CARE MOUTH RINSE: 15.0000 mL | OROMUCOSAL | Status: DC | PRN

## 2022-11-06 MED ORDER — VANCOMYCIN HCL IN DEXTROSE 1-5 GM/200ML-% IV SOLN: 1000.0000 mg | INTRAVENOUS | Status: DC

## 2022-11-06 NOTE — Progress Notes (Signed)
[facility] admitting physician addendum:  The patient has developed hypothermia again and currently has a temperature 94.5 F.  He will be transferred to the stepdown unit show we can monitor closely and treating with a [employer].  He was seen by urology who ruled out penile abscess.  There is questionable aspiration on chest radiograph so we will keep ceftriaxone and metronidazole therapy.  SLP consult requested.  Sanda Klein, MD.

## 2022-11-06 NOTE — Consult Note (Signed)
I have been asked to see the patient by Dr. Sanda Klein, for evaluation and management of possible penile abscess.  History of present illness: 89 year old male presented to the emergency department with fatigue, confusion, and altered mental status.  As part of the evaluation the patient had a CT scan.  This demonstrated columning of urine in the urethra question of penile abscess.  The patient had a urine analysis in the emergency department demonstrated no evidence of infection.  He has no voiding complaints.  He is not complaining of any significant pubic or penile pain.  Review of systems: A 12 point comprehensive review of systems was obtained and is negative unless otherwise stated in the history of present illness.  Patient Active Problem List   Diagnosis Date Noted   Macrocytic anemia 11/06/2022   Mild protein malnutrition (HCC) 11/06/2022   Hypothermia 11/06/2022   Penile lesion 11/06/2022   Acute on chronic heart failure with preserved ejection fraction (HFpEF) (HCC) 08/25/2022   UTI (urinary tract infection) 08/25/2022   Metabolic encephalopathy 08/24/2022   Acute metabolic encephalopathy 08/24/2022   Near syncope 06/23/2020   BPH (benign prostatic hyperplasia) 06/23/2020   Elevated LFTs 06/22/2020   Second degree AV block, Mobitz type I 11/03/2016   Typical atrial flutter (HCC) 10/06/2015   Arthralgia of both knees 07/28/2013   Pericardial effusion 09/30/2012   RBBB and first degree atrioventricular block 09/30/2012   HTN (hypertension) 09/30/2012    No current facility-administered medications on file prior to encounter.   Current Outpatient Medications on File Prior to Encounter  Medication Sig Dispense Refill   aspirin EC 81 MG tablet Take 81 mg by mouth daily.     finasteride (PROSCAR) 5 MG tablet Take 5 mg by mouth daily.     furosemide (LASIX) 20 MG tablet Take 1 tablet (20 mg total) by mouth every morning. 90 tablet 3   omeprazole (PRILOSEC) 20 MG capsule Take  20 mg by mouth daily.     QUEtiapine (SEROQUEL) 25 MG tablet Take 25-50 mg by mouth at bedtime.     spironolactone (ALDACTONE) 25 MG tablet Take 0.5 tablets (12.5 mg total) by mouth daily. 45 tablet 3   metoprolol succinate (TOPROL XL) 25 MG 24 hr tablet Take 1 tablet (25 mg total) by mouth daily. 90 tablet 3    Past Medical History:  Diagnosis Date   BPH (benign prostatic hyperplasia)    Diverticulosis    HTN (hypertension) 09/30/2012   Pericardial effusion     Past Surgical History:  Procedure Laterality Date   APPENDECTOMY  1960   CATARACT EXTRACTION  2009   both eyes   CHOLECYSTECTOMY  2000   ERCP N/A 06/24/2020   Procedure: ENDOSCOPIC RETROGRADE CHOLANGIOPANCREATOGRAPHY (ERCP);  Surgeon: Kerin Salen, MD;  Location: [facility] ENDOSCOPY;  Service: Gastroenterology;  Laterality: N/A;   INGUINAL HERNIA REPAIR  1980's   PACEMAKER IMPLANT N/A 03/21/2020   Procedure: PACEMAKER IMPLANT;  Surgeon: Thurmon Fair, MD;  Location: [facility] INVASIVE CV LAB;  Service: Cardiovascular;  Laterality: N/A;   REMOVAL OF STONES  06/24/2020   Procedure: REMOVAL OF STONES;  Surgeon: Kerin Salen, MD;  Location: [facility] ENDOSCOPY;  Service: Gastroenterology;;   Dennison Mascot  06/24/2020   Procedure: Dennison Mascot;  Surgeon: Kerin Salen, MD;  Location: [facility] ENDOSCOPY;  Service: Gastroenterology;;   STONE EXTRACTION WITH BASKET  06/24/2020   Procedure: STONE EXTRACTION WITH BASKET;  Surgeon: Kerin Salen, MD;  Location: [facility] ENDOSCOPY;  Service: Gastroenterology;;  Basket crushed stones   TOTAL HIP  ARTHROPLASTY  2003   right   WRIST SURGERY  2009   left    Social History   Tobacco Use   Smoking status: Never   Smokeless tobacco: Never  Substance Use Topics   Alcohol use: No   Drug use: No    Family History  Problem Relation Age of Onset   Heart attack Father    Stroke Mother    Heart failure Brother    Stroke Brother    Diabetes Sister    Cancer Sister     PE: Vitals:   11/06/22 0355 11/06/22 0500  11/06/22 0700 11/06/22 0826  BP: 113/67 108/64 127/68 122/71  Pulse: 60 (!) 59 61 64  Resp: 16 11 10 14   Temp:  (!) 97.4 F (36.3 C)  (!) 97.4 F (36.3 C)  TempSrc:  Axillary  Oral  SpO2: 96% 95% 96% 94%   Patient appears to be in no acute distress  patient is alert and oriented x3 Atraumatic normocephalic head No cervical or supraclavicular lymphadenopathy appreciated No increased work of breathing, no audible wheezes/rhonchi Regular sinus rhythm/rate Abdomen is soft, nontender, nondistended, no CVA or suprapubic tenderness Penis normal appearing, no evidence of cellulitis.  No fluctuance or tenderness in or around the urethra or suprapubic region.  Testicles are normal palpating.  The patient does have a condom catheter on his glans penis. Lower extremities are symmetric without appreciable edema Grossly neurologically intact No identifiable skin lesions  Recent Labs    11/05/22 2114 11/05/22 2133 11/06/22 0925  WBC 5.4  --  5.4  HGB 11.9* 12.6* 11.5*  HCT 36.6* 37.0* 35.9*   Recent Labs    11/05/22 2114 11/05/22 2133 11/06/22 0925  NA 136 138 136  K 4.8 4.9 4.8  CL 102 105 103  CO2 25  --  21*  GLUCOSE 89 89 74  BUN 43* 38* 38*  CREATININE 1.22 1.30* 1.10  CALCIUM 9.3  --  9.1   Recent Labs    11/06/22 0925  INR 1.1   No results for input(s): "LABURIN" in the last 72 hours. Results for orders placed or performed during the hospital encounter of 08/24/22  MRSA Next Gen by PCR, Nasal     Status: None   Collection Time: 08/25/22  6:09 AM   Specimen: Nasal Mucosa; Nasal Swab  Result Value Ref Range Status   MRSA by PCR Next Gen NOT DETECTED NOT DETECTED Final    Comment: (NOTE) The GeneXpert MRSA Assay (FDA approved for NASAL specimens only), is one component of a comprehensive MRSA colonization surveillance program. It is not intended to diagnose MRSA infection nor to guide or monitor treatment for MRSA infections. Test performance is not FDA approved in  patients less than 32 years old. Performed at [facility], [address], [place], [place] [address]     Imaging: I reviewed the patient's CT scan with the findings significant for the columning of urine within the urethra which appears like an abscess.  Impression: The patient's CT scan demonstrates scalloping of urine within the urethra with the widening of it at the bulbous urethra.  This is a result of the patient having a condom catheter in the urine pulling or not draining completely out the urethra.  His exam and history as well as his labs do not suggest any penile or urethral abscess.  Recommendations: The patient needs no additional urologic follow-up or treatment.   Thank you for involving me in this patient's care,  Please page with any further questions or concerns. Crist Fat

## 2022-11-06 NOTE — Progress Notes (Signed)
Pharmacy Antibiotic Note  Terry Gregory is a 89 y.o. male admitted on 11/05/2022 with  penile abscess .  Pharmacy has been consulted for Vancomycin dosing.  Plan: Vancomycin 1gm IV q24h to target AUC 400-550.  Estimated AUC on this regimen = 433.   Temp (24hrs), Avg:95.1 F (35.1 C), Min:92.6 F (33.7 C), Max:97.8 F (36.6 C)  Recent Labs  Lab 11/05/22 2114 11/05/22 2132 11/05/22 2133  WBC 5.4  --   --   CREATININE 1.22  --  1.30*  LATICACIDVEN  --  0.5  --     CrCl cannot be calculated (Unknown ideal weight.).    Allergies  Allergen Reactions   Erythromycin Hives and Rash   Penicillins Hives and Rash    Has tolerated cephalosporins    Antimicrobials this admission: 9/23 Cefepime x1 9/24 Metronidazole x1 9/24 Vancomycin >>  9/24 Ceftriaxone >>   Dose adjustments this admission:  Microbiology results: 9/23 BCx:   Thank you for allowing pharmacy to be a part of this patient's care.  Junita Push PharmD 11/06/2022 3:24 AM

## 2022-11-06 NOTE — Evaluation (Addendum)
SLP Cancellation Note  Patient Details Name: Terry Gregory MRN: [id number] DOB: 1921-07-20   Cancelled treatment:       Reason Eval/Treat Not Completed: Other (comment);Medical issues which prohibited therapy (Patient lethargic and hypothermic - will follow up next date. Informed family to plan.  Daughter reports pt consumes a regular/thin diet at home - but "strangles easily" - raising concern for potential aspiration - and note ovoid gas and 17 mm soft tissue in rt bronchus per imaging report)  Reviewed prior MBS note from 08/2022 - which revealed oropharyngeal and cervical esophageal dysphagia - with resultant retention without sensation and audible aspiration of thin without clearance with cued cough.  He was placed on a dys2/thin diet per daughter request.    Rolena Infante, MS [facility] SLP Acute Rehab Services Office [phone]   Chales Abrahams 11/06/2022, 3:50 PM

## 2022-11-06 NOTE — Progress Notes (Signed)
[facility] admitting physician addendum:  The nursing staff just reported that the patient's most recent blood pressure has decreased to 92/51 mmHg. review of the rest of his vital signs showed a heart rate was 59 bpm.  Normal O2 sat and respiratory rate.  He is still hypothermic with a temperature of 94 F at the moment.  NS 250 mL bolus and midodrine 5 mg p.o. x 1 ordered.  Sanda Klein, MD.

## 2022-11-06 NOTE — ED Notes (Signed)
ED TO INPATIENT HANDOFF REPORT  Name/Age/Gender Terry Gregory 89 y.o. male  Code Status    Code Status Orders  (From admission, onward)           Start     Ordered   11/06/22 0317  Do not attempt resuscitation (DNR)- Limited -Do Not Intubate (DNI)  Continuous       Question Answer Comment  If pulseless and not breathing No CPR or chest compressions.   In Pre-Arrest Conditions (Patient Is Breathing and Has A Pulse) Do not intubate. Provide all appropriate non-invasive medical interventions. Avoid ICU transfer unless indicated or required.   Consent: Discussion documented in EHR or advanced directives reviewed      11/06/22 0316           Code Status History     Date Active Date Inactive Code Status Order ID Comments User Context   08/26/2022 0913 08/26/2022 1641 DNR [id number]  Leroy Sea, MD Inpatient   08/25/2022 0106 08/26/2022 0913 Full Code [id number]  Briscoe Deutscher, MD Inpatient   06/23/2020 0401 06/26/2020 1558 Full Code [id number]  John Giovanni, MD Inpatient   03/21/2020 1731 03/22/2020 0018 Full Code [id number]  Croitoru, Rachelle Hora, MD Inpatient      Advance Directive Documentation    Flowsheet Row Most Recent Value  Type of Advance Directive Out of facility DNR (pink MOST or yellow form)  Pre-existing out of facility DNR order (yellow form or pink MOST form) Physician notified to receive inpatient order  "MOST" Form in Place? --       Home/SNF/Other Home  Chief Complaint Acute metabolic encephalopathy [G93.41]  Level of Care/Admitting Diagnosis ED Disposition     ED Disposition  Admit   Condition  --   Comment  Hospital Area: [facility] [[id number]]  Level of Care: Progressive [102]  Admit to Progressive based on following criteria: MULTISYSTEM THREATS such as stable sepsis, metabolic/electrolyte imbalance with or without encephalopathy that is responding to early treatment.  May admit patient to [facility] or [facility] if  equivalent level of care is available:: No  Covid Evaluation: Asymptomatic - no recent exposure (last 10 days) testing not required  Diagnosis: Acute metabolic encephalopathy [[id number]]  Admitting Physician: Angie Fava [[id number]]  Attending Physician: Angie Fava [[id number]]  Certification:: I certify this patient will need inpatient services for at least 2 midnights  Expected Medical Readiness: 11/08/2022          Medical History Past Medical History:  Diagnosis Date   BPH (benign prostatic hyperplasia)    Diverticulosis    Pericardial effusion     Allergies Allergies  Allergen Reactions   Erythromycin Hives and Rash   Penicillins Hives and Rash    Has tolerated cephalosporins    IV Location/Drains/Wounds Patient Lines/Drains/Airways Status     Active Line/Drains/Airways     Name Placement date Placement time Site Days   Peripheral IV 11/05/22 20 G Anterior;Left Forearm 11/05/22  2211  Forearm  1   External Urinary Catheter 11/05/22  2226  --  1            Labs/Imaging Results for orders placed or performed during the hospital encounter of 11/05/22 (from the past 48 hour(s))  CBC WITH DIFFERENTIAL     Status: Abnormal   Collection Time: 11/05/22  9:14 PM  Result Value Ref Range   WBC 5.4 4.0 - 10.5 K/uL   RBC 3.61 (L) 4.22 - 5.81 MIL/uL   Hemoglobin 11.9 (  L) 13.0 - 17.0 g/dL   HCT 16.1 (L) 09.6 - 04.5 %   MCV 101.4 (H) 80.0 - 100.0 fL   MCH 33.0 26.0 - 34.0 pg   MCHC 32.5 30.0 - 36.0 g/dL   RDW 40.9 81.1 - 91.4 %   Platelets 161 150 - 400 K/uL   nRBC 0.0 0.0 - 0.2 %   Neutrophils Relative % 51 %   Neutro Abs 2.8 1.7 - 7.7 K/uL   Lymphocytes Relative 38 %   Lymphs Abs 2.1 0.7 - 4.0 K/uL   Monocytes Relative 8 %   Monocytes Absolute 0.4 0.1 - 1.0 K/uL   Eosinophils Relative 2 %   Eosinophils Absolute 0.1 0.0 - 0.5 K/uL   Basophils Relative 0 %   Basophils Absolute 0.0 0.0 - 0.1 K/uL   Immature Granulocytes 1 %   Abs Immature Granulocytes  0.03 0.00 - 0.07 K/uL    Comment: Performed at [facility], [address], [place], [place] [address]  Basic metabolic panel     Status: Abnormal   Collection Time: 11/05/22  9:14 PM  Result Value Ref Range   Sodium 136 135 - 145 mmol/L   Potassium 4.8 3.5 - 5.1 mmol/L   Chloride 102 98 - 111 mmol/L   CO2 25 22 - 32 mmol/L   Glucose, Bld 89 70 - 99 mg/dL    Comment: Glucose reference range applies only to samples taken after fasting for at least 8 hours.   BUN 43 (H) 8 - 23 mg/dL   Creatinine, Ser 6.21 0.61 - 1.24 mg/dL   Calcium 9.3 8.9 - 30.8 mg/dL   GFR, Estimated 53 (L) >60 mL/min    Comment: (NOTE) Calculated using the CKD-EPI Creatinine Equation (2021)    Anion gap 9 5 - 15    Comment: Performed at [facility], [address] [place], [place] [address]  Ammonia     Status: None   Collection Time: 11/05/22  9:14 PM  Result Value Ref Range   Ammonia 19 9 - 35 umol/L    Comment: Performed at [facility], [address] [place], [place] [address]  Hepatic function panel     Status: Abnormal   Collection Time: 11/05/22  9:14 PM  Result Value Ref Range   Total Protein 7.3 6.5 - 8.1 g/dL   Albumin 3.4 (L) 3.5 - 5.0 g/dL   AST 23 15 - 41 U/L   ALT 19 0 - 44 U/L   Alkaline Phosphatase 107 38 - 126 U/L   Total Bilirubin 0.6 0.3 - 1.2 mg/dL   Bilirubin, Direct 0.1 0.0 - 0.2 mg/dL   Indirect Bilirubin 0.5 0.3 - 0.9 mg/dL    Comment: Performed at [facility], [address] [place], [place] [address]  Magnesium     Status: None   Collection Time: 11/05/22  9:14 PM  Result Value Ref Range   Magnesium 2.3 1.7 - 2.4 mg/dL    Comment: Performed at [facility], [address], [place], [place] [address]  Brain natriuretic peptide     Status: Abnormal   Collection Time: 11/05/22  9:14 PM  Result Value Ref Range   B Natriuretic Peptide 203.2 (H) 0.0 - 100.0 pg/mL    Comment:  Performed at [facility], [address] [place], [place] [address]  Troponin I (High Sensitivity)     Status: None   Collection Time: 11/05/22  9:14  PM  Result Value Ref Range   Troponin I (High Sensitivity) 11 <18 ng/L    Comment: (NOTE) Elevated high sensitivity troponin I (hsTnI) values and significant  changes across serial measurements may suggest ACS but many other  chronic and acute conditions are known to elevate hsTnI results.  Refer to the "Links" section for chest pain algorithms and additional  guidance. Performed at [facility], [address], [place], [place] [address]   TSH     Status: None   Collection Time: 11/05/22  9:14 PM  Result Value Ref Range   TSH 3.246 0.350 - 4.500 uIU/mL    Comment: Performed by a 3rd Generation assay with a functional sensitivity of <=0.01 uIU/mL. Performed at [facility], [address], [place], [place] [address]   Troponin I (High Sensitivity)     Status: None   Collection Time: 11/05/22  9:14 PM  Result Value Ref Range   Troponin I (High Sensitivity) 11 <18 ng/L    Comment: (NOTE) Elevated high sensitivity troponin I (hsTnI) values and significant  changes across serial measurements may suggest ACS but many other  chronic and acute conditions are known to elevate hsTnI results.  Refer to the "Links" section for chest pain algorithms and additional  guidance. Performed at [facility], [address], [place], [place] [address]   CK     Status: Abnormal   Collection Time: 11/05/22  9:14 PM  Result Value Ref Range   Total CK 47 (L) 49 - 397 U/L    Comment: Performed at [facility], [address], [place], [place] [address]  CBG monitoring, ED     Status: None   Collection Time: 11/05/22  9:17 PM  Result Value Ref Range   Glucose-Capillary 84 70 - 99 mg/dL    Comment: Glucose reference range applies only to samples taken after  fasting for at least 8 hours.  I-Stat Lactic Acid, ED     Status: None   Collection Time: 11/05/22  9:32 PM  Result Value Ref Range   Lactic Acid, Venous 0.5 0.5 - 1.9 mmol/L  I-Stat Chem 8, ED     Status: Abnormal   Collection Time: 11/05/22  9:33 PM  Result Value Ref Range   Sodium 138 135 - 145 mmol/L   Potassium 4.9 3.5 - 5.1 mmol/L   Chloride 105 98 - 111 mmol/L   BUN 38 (H) 8 - 23 mg/dL   Creatinine, Ser 2.95 (H) 0.61 - 1.24 mg/dL   Glucose, Bld 89 70 - 99 mg/dL    Comment: Glucose reference range applies only to samples taken after fasting for at least 8 hours.   Calcium, Ion 1.26 1.15 - 1.40 mmol/L   TCO2 25 22 - 32 mmol/L   Hemoglobin 12.6 (L) 13.0 - 17.0 g/dL   HCT 62.1 (L) 30.8 - 65.7 %  Blood gas, venous     Status: Abnormal   Collection Time: 11/05/22  9:49 PM  Result Value Ref Range   pH, Ven 7.39 7.25 - 7.43   pCO2, Ven 44 44 - 60 mmHg   pO2, Ven 72 (H) 32 - 45 mmHg   Bicarbonate 26.6 20.0 - 28.0 mmol/L   Acid-Base Excess 1.2 0.0 - 2.0 mmol/L   O2 Saturation 98 %   Patient temperature 37.0     Comment: Performed at [facility], [address] [place], [place] [address]  Urinalysis, w/ Reflex to  Culture (Infection Suspected) -Urine, Clean Catch     Status: Abnormal   Collection Time: 11/05/22 11:20 PM  Result Value Ref Range   Specimen Source URINE, CLEAN CATCH    Color, Urine STRAW (A) YELLOW   APPearance CLEAR CLEAR   Specific Gravity, Urine 1.008 1.005 - 1.030   pH 6.0 5.0 - 8.0   Glucose, UA NEGATIVE NEGATIVE mg/dL   Hgb urine dipstick NEGATIVE NEGATIVE   Bilirubin Urine NEGATIVE NEGATIVE   Ketones, ur NEGATIVE NEGATIVE mg/dL   Protein, ur NEGATIVE NEGATIVE mg/dL   Nitrite NEGATIVE NEGATIVE   Leukocytes,Ua SMALL (A) NEGATIVE   RBC / HPF 0-5 0 - 5 RBC/hpf   WBC, UA 0-5 0 - 5 WBC/hpf    Comment:        Reflex urine culture not performed if WBC <=10, OR if Squamous epithelial cells >5. If Squamous epithelial cells >5 suggest  recollection.    Bacteria, UA NONE SEEN NONE SEEN   Squamous Epithelial / HPF 0-5 0 - 5 /HPF   Mucus PRESENT    Hyaline Casts, UA PRESENT     Comment: Performed at [facility], [address] [place], [place] [address]   CT Angio Chest PE W and/or Wo Contrast  Result Date: 11/06/2022 CLINICAL DATA:  Altered mental status, sepsis, fatigue. Pulmonary embolism (PE) suspected, high prob; Abdominal pain, acute, nonlocalized EXAM: CT ANGIOGRAPHY CHEST CT ABDOMEN AND PELVIS WITH CONTRAST TECHNIQUE: Multidetector CT imaging of the chest was performed using the standard protocol during bolus administration of intravenous contrast. Multiplanar CT image reconstructions and MIPs were obtained to evaluate the vascular anatomy. Multidetector CT imaging of the abdomen and pelvis was performed using the standard protocol during bolus administration of intravenous contrast. RADIATION DOSE REDUCTION: This exam was performed according to the departmental dose-optimization program which includes automated exposure control, adjustment of the mA and/or kV according to patient size and/or use of iterative reconstruction technique. CONTRAST:  80mL OMNIPAQUE IOHEXOL 350 MG/ML SOLN COMPARISON:  CT chest 06/23/2022, CT abdomen pelvis 06/22/2020 FINDINGS: CTA CHEST FINDINGS Cardiovascular: There is adequate opacification of the pulmonary arterial tree. No intraluminal filling defect identified to suggest acute pulmonary embolism. The central pulmonary arteries are enlarged in keeping with changes of pulmonary arterial hypertension. Mild coronary artery calcification. Mild global cardiomegaly. 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 No pericardial effusion. The thoracic aorta demonstrates fusiform dilation measuring 4.1 cm in maximal diameter within the ascending aorta, 4.0 cm in diameter within the proximal descending aorta just distal to the  arch, and 3.3 cm in diameter distally at the level of the left atrium. Mild atherosclerotic calcification within the thoracic aorta. Mediastinum/Nodes: Visualized thyroid is unremarkable. No pathologic thoracic adenopathy. Esophagus unremarkable. Small hiatal hernia. Lungs/Pleura: Mild bibasilar atelectasis. Mild biapical subpleural minimal fibrotic change. Lungs are otherwise clear. No pneumothorax. Trace bilateral pleural effusions. Ovoid gas and soft tissue density within the right mainstem bronchus measuring 17 mm at axial image # 60/12 is new from prior examination and may represent adherent mucus though an aspirated foreign body could appear similarly. Musculoskeletal: Acid structures are age-appropriate. No acute bone abnormality. No lytic or blastic bone lesion. Review of the MIP images confirms the above findings. CT ABDOMEN and PELVIS FINDINGS Hepatobiliary: Status post cholecystectomy. Nondependent pneumobilia likely relate to changes of sphincterotomy. No intra or extrahepatic biliary ductal dilation. Liver otherwise unremarkable. Pancreas: Unremarkable Spleen: Unremarkable Adrenals/Urinary Tract: Adrenal glands are unremarkable.  Kidneys are normal, without renal calculi, focal lesion, or hydronephrosis. Bladder is unremarkable. Stomach/Bowel: Moderate to severe pancolonic diverticulosis, most severe within the sigmoid colon, without superimposed acute inflammatory change. The stomach, and large bowel are otherwise unremarkable. No evidence of obstruction. No free intraperitoneal gas or fluid. Appendectomy has been performed. Vascular/Lymphatic: Aortic atherosclerosis. No enlarged abdominal or pelvic lymph nodes. Reproductive: Marked prostatic hypertrophy. Tubular gas and fluid collection has developed within the corpus spongiosum of the penis measuring up to 15 mm in diameter and extending along the course of the expected penile and bulbar urethra, new since prior examination. This may represent  dilation of the a urethra secondary to a peripheral stricture within the penile urethra, a false lumen potentially related to traumatic catheterization, or a penile abscess. Other: No abdominal wall hernia or abnormality. No abdominopelvic ascites. Musculoskeletal: Right total hip arthroplasty has been performed. Osseous structures are age-appropriate. No acute bone abnormality. No lytic or blastic bone lesion. Review of the MIP images confirms the above findings. IMPRESSION: 1. No pulmonary embolism. 2. Pulmonary arterial hypertension. 3. Mild coronary artery calcification. Mild global cardiomegaly. 4. Trace bilateral pleural effusions. 5. Ovoid gas and soft tissue density within the right mainstem bronchus measuring 17 mm is new from prior examination and may represent adherent mucus though an aspirated foreign body could appear similarly. 6. Moderate to severe pancolonic diverticulosis without superimposed acute inflammatory change. 7. Marked prostatic hypertrophy. 8. Tubular fluid collection within the corpus spongiosum of the penis, new since prior examination, in keeping with a distended anterior urethra, false lumen secondary to traumatic catheterization, or a penile abscess. Aortic Atherosclerosis (ICD10-I70.0). Electronically Signed   By: Helyn Numbers M.D.   On: 11/06/2022 01:15   CT ABDOMEN PELVIS W CONTRAST  Result Date: 11/06/2022 CLINICAL DATA:  Altered mental status, sepsis, fatigue. Pulmonary embolism (PE) suspected, high prob; Abdominal pain, acute, nonlocalized EXAM: CT ANGIOGRAPHY CHEST CT ABDOMEN AND PELVIS WITH CONTRAST TECHNIQUE: Multidetector CT imaging of the chest was performed using the standard protocol during bolus administration of intravenous contrast. Multiplanar CT image reconstructions and MIPs were obtained to evaluate the vascular anatomy. Multidetector CT imaging of the abdomen and pelvis was performed using the standard protocol during bolus administration of intravenous  contrast. RADIATION DOSE REDUCTION: This exam was performed according to the departmental dose-optimization program which includes automated exposure control, adjustment of the mA and/or kV according to patient size and/or use of iterative reconstruction technique. CONTRAST:  80mL OMNIPAQUE IOHEXOL 350 MG/ML SOLN COMPARISON:  CT chest 06/23/2022, CT abdomen pelvis 06/22/2020 FINDINGS: CTA CHEST FINDINGS Cardiovascular: There is adequate opacification of the pulmonary arterial tree. No intraluminal filling defect identified to suggest acute pulmonary embolism. The central pulmonary arteries are enlarged in keeping with changes of pulmonary arterial hypertension. Mild coronary artery calcification. Mild global cardiomegaly. 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 No pericardial effusion. The thoracic aorta demonstrates fusiform dilation measuring 4.1 cm in maximal diameter within the ascending aorta, 4.0 cm in diameter within the proximal descending aorta just distal to the arch, and 3.3 cm in diameter distally at the level of the left atrium. Mild atherosclerotic calcification within the thoracic aorta. Mediastinum/Nodes: Visualized thyroid is unremarkable. No pathologic thoracic adenopathy. Esophagus unremarkable. Small hiatal hernia. Lungs/Pleura: Mild bibasilar atelectasis. Mild biapical subpleural minimal fibrotic change. Lungs are otherwise clear. No pneumothorax. Trace bilateral pleural effusions. Ovoid gas and soft tissue density within the right mainstem bronchus measuring 17 mm  at axial image # 60/12 is new from prior examination and may represent adherent mucus though an aspirated foreign body could appear similarly. Musculoskeletal: Acid structures are age-appropriate. No acute bone abnormality. No lytic or blastic bone lesion. Review of the MIP images confirms the above findings. CT ABDOMEN and PELVIS FINDINGS Hepatobiliary: Status post  cholecystectomy. Nondependent pneumobilia likely relate to changes of sphincterotomy. No intra or extrahepatic biliary ductal dilation. Liver otherwise unremarkable. Pancreas: Unremarkable Spleen: Unremarkable Adrenals/Urinary Tract: Adrenal glands are unremarkable. Kidneys are normal, without renal calculi, focal lesion, or hydronephrosis. Bladder is unremarkable. Stomach/Bowel: Moderate to severe pancolonic diverticulosis, most severe within the sigmoid colon, without superimposed acute inflammatory change. The stomach, and large bowel are otherwise unremarkable. No evidence of obstruction. No free intraperitoneal gas or fluid. Appendectomy has been performed. Vascular/Lymphatic: Aortic atherosclerosis. No enlarged abdominal or pelvic lymph nodes. Reproductive: Marked prostatic hypertrophy. Tubular gas and fluid collection has developed within the corpus spongiosum of the penis measuring up to 15 mm in diameter and extending along the course of the expected penile and bulbar urethra, new since prior examination. This may represent dilation of the a urethra secondary to a peripheral stricture within the penile urethra, a false lumen potentially related to traumatic catheterization, or a penile abscess. Other: No abdominal wall hernia or abnormality. No abdominopelvic ascites. Musculoskeletal: Right total hip arthroplasty has been performed. Osseous structures are age-appropriate. No acute bone abnormality. No lytic or blastic bone lesion. Review of the MIP images confirms the above findings. IMPRESSION: 1. No pulmonary embolism. 2. Pulmonary arterial hypertension. 3. Mild coronary artery calcification. Mild global cardiomegaly. 4. Trace bilateral pleural effusions. 5. Ovoid gas and soft tissue density within the right mainstem bronchus measuring 17 mm is new from prior examination and may represent adherent mucus though an aspirated foreign body could appear similarly. 6. Moderate to severe pancolonic  diverticulosis without superimposed acute inflammatory change. 7. Marked prostatic hypertrophy. 8. Tubular fluid collection within the corpus spongiosum of the penis, new since prior examination, in keeping with a distended anterior urethra, false lumen secondary to traumatic catheterization, or a penile abscess. Aortic Atherosclerosis (ICD10-I70.0). Electronically Signed   By: Helyn Numbers M.D.   On: 11/06/2022 01:15   CT HEAD WO CONTRAST  Result Date: 11/05/2022 CLINICAL DATA:  Mental status change EXAM: CT HEAD WITHOUT CONTRAST TECHNIQUE: Contiguous axial images were obtained from the base of the skull through the vertex without intravenous contrast. RADIATION DOSE REDUCTION: This exam was performed according to the departmental dose-optimization program which includes automated exposure control, adjustment of the mA and/or kV according to patient size and/or use of iterative reconstruction technique. COMPARISON:  Head CT 08/24/2022 FINDINGS: Brain: No evidence of acute infarction, hemorrhage, hydrocephalus, extra-axial collection or mass lesion/mass effect. Again seen is mild diffuse atrophy. There is an old lacunar infarct in the left corona radiata and basal ganglia, unchanged. Vascular: No hyperdense vessel or unexpected calcification. Skull: Normal. Negative for fracture or focal lesion. Sinuses/Orbits: No acute finding. Other: None. IMPRESSION: 1. No acute intracranial abnormality. 2. Mild diffuse atrophy. 3. 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 Electronically Signed   By: Darliss Cheney M.D.   On: 11/05/2022 22:21    Pending Labs Unresulted Labs (From admission, onward)     Start     Ordered   11/06/22 0500  CBC with Differential/Platelet  Tomorrow morning,   R        11/06/22 0317   11/06/22 0500  Comprehensive metabolic  panel  Tomorrow morning,   R        11/06/22 0317   11/06/22 0500  Magnesium  Tomorrow morning,   R        11/06/22 0317    11/06/22 0500  Protime-INR  Tomorrow morning,   R        11/06/22 0317   11/05/22 2127  Blood Culture (routine x 2)  (Undifferentiated presentation (screening labs and basic nursing orders))  BLOOD CULTURE X 2,   STAT      11/05/22 2127            Vitals/Pain Today's Vitals   11/06/22 0316 11/06/22 0355 11/06/22 0500 11/06/22 0700  BP:  113/67 108/64 127/68  Pulse:  60 (!) 59 61  Resp:  16 11 10   Temp: (!) 97.4 F (36.3 C)  (!) 97.4 F (36.3 C)   TempSrc: Axillary  Axillary   SpO2:  96% 95% 96%  PainSc:        Isolation Precautions No active isolations  Medications Medications  lactated ringers infusion ( Intravenous New Bag/Given 11/06/22 0032)  acetaminophen (TYLENOL) tablet 650 mg (has no administration in time range)    Or  acetaminophen (TYLENOL) suppository 650 mg (has no administration in time range)  ondansetron (ZOFRAN) injection 4 mg (has no administration in time range)  cefTRIAXone (ROCEPHIN) 1 g in sodium chloride 0.9 % 100 mL IVPB (has no administration in time range)  vancomycin (VANCOCIN) IVPB 1000 mg/200 mL premix (has no administration in time range)  lactated ringers bolus 500 mL (0 mLs Intravenous Stopped 11/06/22 0303)  iohexol (OMNIPAQUE) 350 MG/ML injection 80 mL (80 mLs Intravenous Contrast Given 11/05/22 2305)  ceFEPIme (MAXIPIME) 2 g in sodium chloride 0.9 % 100 mL IVPB (0 g Intravenous Stopped 11/06/22 0303)  metroNIDAZOLE (FLAGYL) IVPB 500 mg (0 mg Intravenous Stopped 11/06/22 0303)  vancomycin (VANCOREADY) IVPB 2000 mg/400 mL (2,000 mg Intravenous New Bag/Given 11/06/22 0142)    Mobility non-ambulatory   A&Ox3, on RA, VS stable

## 2022-11-06 NOTE — H&P (Addendum)
History and Physical    Patient: Terry Gregory:[id number] DOB: April 14, 1921 DOA: 11/05/2022 DOS: the patient was seen and examined on 11/06/2022 PCP: Geoffry Paradise, MD  Patient coming from: Home  Chief Complaint:  Chief Complaint  Patient presents with   Fatigue   HPI: Terry Gregory is a 89 y.o. male with medical history significant of BPH, diverticulosis, pericardial effusion, hypertension, chronic diastolic heart failure, typical atrial flutter, right bundle branch block, first-degree AV block, second-degree AV block Mobitz type I, near syncope, elevated LFTs, osteoarthritis, history of CVA found incidentally on imaging who was brought to the emergency department due to altered mental status and fatigue for the past 2 days.  On Sunday evening he was having hallucinations according to his daughter and she had to watch him most of the night.  He has been experiencing increasing confusion and these episodes of hallucinations recently.  He was prescribed Seroquel as needed.  He got his first dose on Sunday evening but did not sleep until around 0500 and woke up around 1130.  He stated that he does not remember everything has happened in the last 2 days.  He normally ambulates with a walker.  His appetite is usually good, but did not eat well yesterday.  History is taken from the patient's daughter.  He became hypothermic in the emergency department.  He is able to answer simple questions.  He denied dyspnea, dizziness, headache, abdominal or chest pain at the moment.  He complaining of lower back pain during examination.  Lab work: Urinalysis with small leukocyte esterase.  CBC showed a white count of 5.4, hemoglobin 11.9 g/dL with an MCV of 109.3 fL and platelets 161.  BMP showed a BUN of 43 mg/dL but otherwise normal.  LFTs unremarkable except for an albumin of 3.4 g/dL.  Lactic acid, troponin x 2, ammonia level, TSH, total CK and magnesium level were unremarkable.  Venous blood gas showed a pCO2  of 72 mmHg but no other abnormality seen.  Imaging: CT head without contrast/showed no acute intracranial normality.  Mild diffuse atrophy.  Old lacunar infarct in the left corona radiator and basal ganglia, unchanged.  CTA chest no PE.  Pulmonary arterial hypertension.  Mild CAD.  Mild global cardiomegaly.  Trace bilateral pleural effusion.  Ovoid gas and soft tissue density within the right mainstem bronchus measuring 17 mm that is new from prior examination and may represent other mucous or an aspirated foreign body.  CT abdomen/pelvis with contrast moderate to severe pancolonic diverticulosis without diverticulitis.  Marked prostatic hypertrophy.  Tubular fluid collection within the coarse blood spongiosum of the penis, new since prior examination which could be false lumen secondary to traumatic catheterization or penile abscess.  Aortic atherosclerosis.   ED course: Initial vital signs were temperature 97.8 F initially but subsequently decreased to 92.6 F, pulse 70, respiration 9, BP 150/89 mmHg O2 sat 99% on room air.  The patient received LR 1000 mL bolus, cefepime 2 g IVPB, metronidazole 500 mg IVP, vancomycin per pharmacy and was started on LR at 75 mL/h.  Review of Systems: As mentioned in the history of present illness. All other systems reviewed and are negative. Past Medical History:  Diagnosis Date   BPH (benign prostatic hyperplasia)    Diverticulosis    Pericardial effusion    Past Surgical History:  Procedure Laterality Date   APPENDECTOMY  1960   CATARACT EXTRACTION  2009   both eyes   CHOLECYSTECTOMY  2000   ERCP N/A 06/24/2020  Procedure: ENDOSCOPIC RETROGRADE CHOLANGIOPANCREATOGRAPHY (ERCP);  Surgeon: Kerin Salen, MD;  Location: [facility] ENDOSCOPY;  Service: Gastroenterology;  Laterality: N/A;   INGUINAL HERNIA REPAIR  1980's   PACEMAKER IMPLANT N/A 03/21/2020   Procedure: PACEMAKER IMPLANT;  Surgeon: Thurmon Fair, MD;  Location: [facility] INVASIVE CV LAB;  Service: Cardiovascular;   Laterality: N/A;   REMOVAL OF STONES  06/24/2020   Procedure: REMOVAL OF STONES;  Surgeon: Kerin Salen, MD;  Location: [facility] ENDOSCOPY;  Service: Gastroenterology;;   Dennison Mascot  06/24/2020   Procedure: Dennison Mascot;  Surgeon: Kerin Salen, MD;  Location: [facility] ENDOSCOPY;  Service: Gastroenterology;;   STONE EXTRACTION WITH BASKET  06/24/2020   Procedure: STONE EXTRACTION WITH BASKET;  Surgeon: Kerin Salen, MD;  Location: [facility] ENDOSCOPY;  Service: Gastroenterology;;  Basket crushed stones   TOTAL HIP ARTHROPLASTY  2003   right   WRIST SURGERY  2009   left   Social History:  reports that he has never smoked. He has never used smokeless tobacco. He reports that he does not drink alcohol and does not use drugs.  Allergies  Allergen Reactions   Erythromycin Hives and Rash   Penicillins Hives and Rash    Has tolerated cephalosporins    Family History  Problem Relation Age of Onset   Heart attack Father    Stroke Mother    Heart failure Brother    Stroke Brother    Diabetes Sister    Cancer Sister     Prior to Admission medications   Medication Sig Start Date End Date Taking? Authorizing Provider  aspirin EC 81 MG tablet Take 81 mg by mouth daily.   Yes [provider]  finasteride (PROSCAR) 5 MG tablet Take 5 mg by mouth daily. 09/27/17  Yes [provider]  furosemide (LASIX) 20 MG tablet Take 1 tablet (20 mg total) by mouth every morning. 09/06/22  Yes Croitoru, Mihai, MD  omeprazole (PRILOSEC) 20 MG capsule Take 20 mg by mouth daily.   Yes [provider]  QUEtiapine (SEROQUEL) 25 MG tablet Take 25-50 mg by mouth at bedtime. 10/29/22  Yes [provider]  spironolactone (ALDACTONE) 25 MG tablet Take 0.5 tablets (12.5 mg total) by mouth daily. 09/06/22 12/05/22 Yes Croitoru, Mihai, MD  metoprolol succinate (TOPROL XL) 25 MG 24 hr tablet Take 1 tablet (25 mg total) by mouth daily. 09/06/22   Croitoru, Rachelle Hora, MD    Physical Exam: Vitals:   11/06/22  0316 11/06/22 0355 11/06/22 0500 11/06/22 0700  BP:  113/67 108/64 127/68  Pulse:  60 (!) 59 61  Resp:  16 11 10   Temp: (!) 97.4 F (36.3 C)  (!) 97.4 F (36.3 C)   TempSrc: Axillary  Axillary   SpO2:  96% 95% 96%   Physical Exam Vitals and nursing note reviewed.  Constitutional:      General: He is awake. He is not in acute distress.    Appearance: He is ill-appearing.  HENT:     Head: Normocephalic.     Nose: No rhinorrhea.     Mouth/Throat:     Mouth: Mucous membranes are moist.  Eyes:     General: No scleral icterus.    Pupils: Pupils are equal, round, and reactive to light.  Neck:     Vascular: No JVD.  Cardiovascular:     Rate and Rhythm: Normal rate and regular rhythm.     Heart sounds: S1 normal and S2 normal.  Pulmonary:     Effort: Pulmonary effort is normal.  Breath sounds: No wheezing, rhonchi or rales.  Abdominal:     General: Bowel sounds are normal.     Palpations: Abdomen is soft.     Tenderness: There is no abdominal tenderness.  Genitourinary:    Comments: Condom cath in place.  No erythema. Musculoskeletal:     Cervical back: Neck supple.     Right lower leg: No edema.     Left lower leg: No edema.  Skin:    General: Skin is warm and dry.  Neurological:     General: No focal deficit present.     Mental Status: He is alert. Mental status is at baseline. He is disoriented.  Psychiatric:        Mood and Affect: Mood normal.        Behavior: Behavior is cooperative.    Data Reviewed:  Results are pending, will review when available. 08/25/2022 transthoracic echocardiogram IMPRESSIONS:   1. Left ventricular ejection fraction, by estimation, is 40 to 45%. The  left ventricle has mildly decreased function. Left ventricular endocardial  border not optimally defined to evaluate regional wall motion. There is  mild left ventricular hypertrophy.   Left ventricular diastolic parameters are consistent with Grade I  diastolic dysfunction (impaired  relaxation).   2. Right ventricular systolic function is normal. The right ventricular  size is normal.   3. The mitral valve is abnormal. Trivial mitral valve regurgitation. No  evidence of mitral stenosis.   4. The tricuspid valve is abnormal. Tricuspid valve regurgitation is  moderate.   5. The aortic valve has an indeterminant number of cusps. There is  moderate calcification of the aortic valve. Aortic valve regurgitation is  not visualized. No aortic stenosis is present.   6. Aortic dilatation noted. There is borderline dilatation of the aortic  root, measuring 38 mm. There is mild dilatation of the ascending aorta,  measuring 41 mm.   7. The inferior vena cava is normal in size with greater than 50%  respiratory variability, suggesting right atrial pressure of 3 mmHg.    EKG: Vent. rate 85 BPM PR interval 104 ms QRS duration 210 ms QT/QTcB 467/556 ms P-R-T axes -77 -72 88 VENTRICULAR PACED RHYTHM  Assessment and Plan: Principal Problem:   Acute metabolic encephalopathy Seen to be improving. Inpatient/PCU. Neurochecks. Consult PT and OT. Consult SLP. Continue treatment of other issues as below.  Active Problems:  Hypothermia Questionable aspiration. Almost resolved initially but has recurred now. We are transferring to SDU for closer monitoring. Continue ceftriaxone and metronidazole for aspiration.    Penile lesion Questionable abscess. Received cefepime last night. -Will be starting ceftriaxone 1 g IVPB. Continue metronidazole 500 mg IVPB. Continue vancomycin per pharmacy. Await response from urology or IP consult.    BPH (benign prostatic hyperplasia) Continue finasteride 5 mg p.o. daily.     HTN (hypertension) Continue metoprolol succinate 25 mg p.o. daily.    Typical atrial flutter (HCC)  Paced rhythm. On metoprolol as above. Not on anticoagulation.    Macrocytic anemia Monitor hematocrit and hemoglobin.    Mild protein malnutrition  (HCC) May benefit from protein supplementation. Consider nutritional services evaluation. Follow-up albumin level.     Advance Care Planning:   Code Status: Limited: Do not attempt resuscitation (DNR) -DNR-LIMITED -Do Not Intubate/DNI    Consults: Urology ().  Family Communication: His daughter Larene Beach was at bedside.  Severity of Illness: The appropriate patient status for this patient is INPATIENT. Inpatient status is judged to be reasonable and necessary  in order to provide the required intensity of service to ensure the patient's safety. The patient's presenting symptoms, physical exam findings, and initial radiographic and laboratory data in the context of their chronic comorbidities is felt to place them at high risk for further clinical deterioration. Furthermore, it is not anticipated that the patient will be medically stable for discharge from the hospital within 2 midnights of admission.   * I certify that at the point of admission it is my clinical judgment that the patient will require inpatient hospital care spanning beyond 2 midnights from the point of admission due to high intensity of service, high risk for further deterioration and high frequency of surveillance required.*  Author: Bobette Mo, MD 11/06/2022 8:01 AM  For on call review [email or web address].   This document was prepared using Dragon voice recognition software and may contain some unintended transcription errors.

## 2022-11-06 NOTE — Progress Notes (Signed)
  Carryover admission to the Day Admitter.  I discussed this case with the EDP, Dr. Preston Fleeting.  Per these discussions:   This is a 88 year old male who is being admitted with suspected acute metabolic encephalopathy in the setting of penile abscess after presenting with 1 to 2 days of confusion, somnolence, fatigue.   He was noted to be hypothermic with rectal temperature of 92.9.  No leukocytosis.  No additional SIRS criteria met.  Therefore was deemed to be nonseptic.  Bair hugger initiated and hypothermia improving.  Ensuing CT chest abdomen, pelvis revealed suggestion of tubular fluid collection within the penis concerning for abscess.  Blood cultures x 2 were collected and, initially, received broad-spectrum IV antibiotics in the form of 8, cefepime, Flagyl.  I have placed an order for inpatient admission to PCU for further evaluation management of the above.  I have placed some additional preliminary admit orders via the adult multi-morbid admission order set.  Patient currently receiving continuous lactated Ringer's at 75 cc/h.  I have also ordered IV vancomycin, Rocephin, NPO.  Also ordered morning labs in the form of CMP, CBC, magnesium level and INR.    Terry Pigg, DO Hospitalist

## 2022-11-07 DIAGNOSIS — G9341 Metabolic encephalopathy: Secondary | ICD-10-CM | POA: Diagnosis not present

## 2022-11-07 MED ORDER — QUETIAPINE FUMARATE 50 MG PO TABS
25.0000 mg | ORAL_TABLET | Freq: Every evening | ORAL | Status: DC | PRN
  Administered 2022-11-08: 25 mg via ORAL
  Filled 2022-11-07: qty 1

## 2022-11-07 NOTE — Plan of Care (Signed)

## 2022-11-07 NOTE — Progress Notes (Signed)
Clinical/Bedside Swallow Evaluation Patient Details  Name: Terry Gregory MRN: [id number] Date of Birth: 29-Jan-1922  Today's Date: 11/07/2022 Time: SLP Start Time (ACUTE ONLY): (P) 1005 SLP Stop Time (ACUTE ONLY): (P) 1053 SLP Time Calculation (min) (ACUTE ONLY): (P) 48 min  Past Medical History:  Past Medical History:  Diagnosis Date   BPH (benign prostatic hyperplasia)    Diverticulosis    HTN (hypertension) 09/30/2012   Pericardial effusion    Past Surgical History:  Past Surgical History:  Procedure Laterality Date   APPENDECTOMY  1960   CATARACT EXTRACTION  2009   both eyes   CHOLECYSTECTOMY  2000   ERCP N/A 06/24/2020   Procedure: ENDOSCOPIC RETROGRADE CHOLANGIOPANCREATOGRAPHY (ERCP);  Surgeon: Kerin Salen, MD;  Location: [facility] ENDOSCOPY;  Service: Gastroenterology;  Laterality: N/A;   INGUINAL HERNIA REPAIR  1980's   PACEMAKER IMPLANT N/A 03/21/2020   Procedure: PACEMAKER IMPLANT;  Surgeon: Thurmon Fair, MD;  Location: [facility] INVASIVE CV LAB;  Service: Cardiovascular;  Laterality: N/A;   REMOVAL OF STONES  06/24/2020   Procedure: REMOVAL OF STONES;  Surgeon: Kerin Salen, MD;  Location: [facility] ENDOSCOPY;  Service: Gastroenterology;;   Dennison Mascot  06/24/2020   Procedure: Dennison Mascot;  Surgeon: Kerin Salen, MD;  Location: [facility] ENDOSCOPY;  Service: Gastroenterology;;   STONE EXTRACTION WITH BASKET  06/24/2020   Procedure: STONE EXTRACTION WITH BASKET;  Surgeon: Kerin Salen, MD;  Location: [facility] ENDOSCOPY;  Service: Gastroenterology;;  Basket crushed stones   TOTAL HIP ARTHROPLASTY  2003   right   WRIST SURGERY  2009   left   HPI:  (P) Patient is a 89 year old male admitted to [facility] with altered mental status, noted to have hallucinations - diagnosed with acute encephalopathy -concern for penile abscess, found to have incidental CVA, and pericardial effusion.  H/O Afib, memory loss percardial effusions, BPH, h/o spincherectomy with gallbladder surgery, per imaging Ovoid gas and soft  tissue density within the right mainstem bronchus measuring 17 mm that is new from prior examination and may represent other mucous or an aspirated foreign body. swallow eval ordered. 08/2022 - audible aspiration without clearance with cued cough- was placed on dys2/thin.    Assessment / Plan / Recommendation  Clinical Impression  (P) Patient presents with clinical indications of dysphagia similar to findings on his prior MBS from July 2024.  At that time his dysphagia was marked by decreased laryngeal closure elevation and decreased motility resulting in aspiration and retention with inconsistent sensation.  In addition backflow of liquids from cervical esophagus into pharynx was observed.  At that time it was decided for patient to consume dysphagia 2 and thin liquid diet with excepted aspiration risk and mitigation strategies in place.  Daughter arrived during session and indicated patient was unhappy with that diet and requested a soft diet.  Educated her to swallowing precautions and importance for patient to maintain cough and expectoration strength to maximize his airway protection.  Both daughter and patient deny a significant episode of aspiration prior to this admission.  They do both admit that he coughs frequently during intake more so with liquids and solids.  Recommend diet as daughter and patient desire and SLP will follow up wants to assure mitigation strategies are in place with family comprehension.  Provided information on antichoking device to patient and his daughter given his dysphagia. SLP Visit Diagnosis: (P) Dysphagia, pharyngoesophageal phase (R13.14)    Aspiration Risk  (P) Moderate aspiration risk;Severe aspiration risk    Diet Recommendation (P) Dysphagia 3 (Mech soft);Thin liquid (  with accepted risks and mitigation strategies)    Liquid Administration via: (P) Cup;Straw Medication Administration: (P)  (Consider with nectar thick liquids as daughter reports patient  masticate's pills with applesauce though some of those are contraindicated if not consuming whole) Supervision: (P) Full supervision/cueing for compensatory strategies Compensations: (P) Minimize environmental distractions;Slow rate;Small sips/bites (If reflexively coughing, cough strongly and expectorate, leave suction within patient's reach please) Postural Changes: (P) Seated upright at 90 degrees;Remain upright for at least 30 minutes after po intake    Other  Recommendations Oral Care Recommendations: (P) Oral care BID Caregiver Recommendations: (P) Have oral suction available    Recommendations for follow up therapy are one component of a multi-disciplinary discharge planning process, led by the attending physician.  Recommendations may be updated based on patient status, additional functional criteria and insurance authorization.  Follow up Recommendations (P)  (Daughter desire for patient to have home health SLP)      Assistance Recommended at Discharge    Functional Status Assessment (P) Patient has had a recent decline in their functional status and/or demonstrates limited ability to make significant improvements in function in a reasonable and predictable amount of time  Frequency and Duration            Prognosis Prognosis for improved oropharyngeal function: (P) Fair Barriers to Reach Goals: (P) Time post onset;Severity of deficits Barriers/Prognosis Comment: (P) Chronicity      Swallow Study   General Date of Onset: (P) 11/07/22 HPI: (P) Patient is a 89 year old male admitted to [facility] with altered mental status, noted to have hallucinations - diagnosed with acute encephalopathy -concern for penile abscess, found to have incidental CVA, and pericardial effusion.  H/O Afib, memory loss percardial effusions, BPH, h/o spincherectomy with gallbladder surgery, per imaging Ovoid gas and soft tissue density within the right mainstem bronchus measuring 17 mm that is new from  prior examination and may represent other mucous or an aspirated foreign body. swallow eval ordered. 08/2022 - audible aspiration without clearance with cued cough- was placed on dys2/thin. Type of Study: (P) Bedside Swallow Evaluation Previous Swallow Assessment: (P) MBS study 08/2022 Diet Prior to this Study: (P) NPO Temperature Spikes Noted: (P) No Respiratory Status: (P) Room air History of Recent Intubation: (P) No Behavior/Cognition: (P) Alert;Cooperative;Pleasant mood (HOH) Oral Cavity Assessment: (P) Dried secretions (Oral care provided clearing dried secretions from posterior oral cavity using toothbrush, [place] and oral suction) Oral Care Completed by SLP: (P) Other (Comment) (With patient assistance) Oral Cavity - Dentition: (P) Adequate natural dentition Vision: (P) Functional for self-feeding Self-Feeding Abilities: (P) Able to feed self;Needs set up Patient Positioning: (P) Upright in bed Baseline Vocal Quality: (P)  (At times wet, clears with cued cough and expectoration) Volitional Cough: (P) Strong Volitional Swallow: (P) Able to elicit    Oral/Motor/Sensory Function Overall Oral Motor/Sensory Function: (P) Generalized oral weakness Facial ROM: (P) Within Functional Limits Facial Symmetry: (P) Within Functional Limits Facial Strength: (P) Within Functional Limits Facial Sensation: (P) Within Functional Limits Lingual ROM: (P) Within Functional Limits Lingual Symmetry: (P) Within Functional Limits Lingual Strength: (P) Within Functional Limits Lingual Sensation: (P) Within Functional Limits Velum: (P) Within Functional Limits Mandible: (P) Within Functional Limits   Ice Chips Ice chips: (P) Within functional limits Presentation: (P) Spoon   Thin Liquid Thin Liquid: (P) Impaired Presentation: (P) Cup;Spoon;Straw;Self Fed Pharyngeal  Phase Impairments: (P) Cough - Immediate;Cough - Delayed;Multiple swallows    Nectar Thick Nectar Thick Liquid: (P) Not tested   Honey  Thick Honey Thick Liquid: (P) Not tested   Puree Puree: (P) Within functional limits Presentation: (P) Self Fed;Spoon   Solid     Solid: (P) Within functional limits Presentation: (P) Self Fed;Spoon      Chales Abrahams 11/07/2022,11:11 AM Rolena Infante, MS [facility] SLP Acute Rehab Services Office [phone]

## 2022-11-07 NOTE — Progress Notes (Signed)
HOSPITALIST ROUNDING NOTE Terry Gregory ZHY:[id number]  DOB: December 19, 1921  DOA: 11/05/2022  PCP: Geoffry Paradise, MD  11/07/2022,6:36 AM   LOS: 1 day      Code Status: DNR   From:  home    Current Dispo: unclear     53 male ambulates with a walker heart failure both systolic diastolic EF 45% 08/2022 admission  atrial fibrillation/flutter CHADVASC >3 with PPM (no anticoagulation)-placed 2/2 second-degree AV block 2022 Previous pericardial effusion in 2014 BPH Dysphagia ii Previous ERCP 06/24/2020 for transaminitis on admission with CBD stone Recent hospitalization 08/24/2022-08/26/2022 acute encephalopathy?  2/2 UTI with hospital acquired delirium--- his delirium apparently never really cleared and he has had intermittent confusion and hallucinations He was placed on Seroquel to take as needed but had not been taking it until the night before admission 10/2020  Admit from home 11/05/2022 fatigue-found to be fatigued Tmax 92.6 In ED pacemaker interrogated CT head negative CTA chest abdomen pelvis showed engorged enlarged area of penis secondary to false lumen from traumatic catheterization no penile abscess Urology consult and did not feel like any further workup was needed  Patient became hypothermic again and was placed on Bair hugger for Tmax of  94.5 x-ray showed aspiration on chest radiograph, patient was started on ceftriaxone Flagyl   Plan  Severe sepsis on admission with hypotension secondary to  aspiration in the setting of mild to moderate dementia Continue empiric ceftriaxone Flagyl-BCx 2---11/05/2022 still pending As blood pressures have resolved pretty well would check labs in a.m. but keep volumes even given his advanced age Continue dysphagia diet as per speech therapy and will need precautions with regards to same on discharge  Systolic diastolic heart failure EF 45% 03/9526 admission Lasix 20 every morning on hold I discontinued Aldactone 12.5 Can continue metoprolol XL 25  daily  Atrial fibrillation/flutter CHADVASC >3  PPM placed secondary to second-degree AV block in 2022 No concerns at this time for other etiologies of fever or sepsis as does have pacemaker I am not able to access pacemaker interrogation Continue metoprolol XL 12.5 daily  Progressive dementia stage III-IV at least Intermittent hallucinations at home ambulates several steps but has declined over the past 3 months per daughter Continue Seroquel cautiously with low-dose 25    DVT prophylaxis: SCD  Status is: Inpatient Remains inpatient appropriate because:   Requires further workup    Subjective: Awake pleasant no distress but very hard of hearing and ROS is limited No chest pain no fever Daughter gives most of history  Objective + exam Vitals:   11/06/22 2343 11/07/22 0010 11/07/22 0400 11/07/22 0500  BP:  (!) 107/52 (!) 114/58 (!) 95/45  Pulse:  78 63 61  Resp:  18 (!) 26 12  Temp: 97.6 F (36.4 C)  97.9 F (36.6 C)   TempSrc: Oral  Oral   SpO2:  94% 92% (!) 88%  Weight:    96.5 kg  Height:       Filed Weights   11/06/22 1427 11/07/22 0500  Weight: 94 kg 96.5 kg    Examination:  Well-built nourished white male Pacemaker in left chest Chest anteriorly is clear S1-S2 seems to be in sinus Abdomen soft No lower extremity edema ROM intact  Data Reviewed: reviewed   CBC    Component Value Date/Time   WBC 5.4 11/06/2022 0925   RBC 3.51 (L) 11/06/2022 0925   HGB 11.5 (L) 11/06/2022 0925   HGB 12.6 (L) 03/16/2020 1017   HCT 35.9 (L) 11/06/2022  0925   HCT 36.8 (L) 03/16/2020 1017   PLT 155 11/06/2022 0925   PLT 233 03/16/2020 1017   MCV 102.3 (H) 11/06/2022 0925   MCV 97 03/16/2020 1017   MCH 32.8 11/06/2022 0925   MCHC 32.0 11/06/2022 0925   RDW 15.7 (H) 11/06/2022 0925   RDW 11.8 03/16/2020 1017   LYMPHSABS 1.5 11/06/2022 0925   MONOABS 0.4 11/06/2022 0925   EOSABS 0.1 11/06/2022 0925   BASOSABS 0.0 11/06/2022 0925      Latest Ref Rng & Units  11/06/2022    9:25 AM 11/05/2022    9:33 PM 11/05/2022    9:14 PM  CMP  Glucose 70 - 99 mg/dL 74  89  89   BUN 8 - 23 mg/dL 38  38  43   Creatinine 0.61 - 1.24 mg/dL 4.09  8.11  9.14   Sodium 135 - 145 mmol/L 136  138  136   Potassium 3.5 - 5.1 mmol/L 4.8  4.9  4.8   Chloride 98 - 111 mmol/L 103  105  102   CO2 22 - 32 mmol/L 21   25   Calcium 8.9 - 10.3 mg/dL 9.1   9.3   Total Protein 6.5 - 8.1 g/dL 6.9   7.3   Total Bilirubin 0.3 - 1.2 mg/dL 0.8   0.6   Alkaline Phos 38 - 126 U/L 94   107   AST 15 - 41 U/L 20   23   ALT 0 - 44 U/L 19   19      Scheduled Meds:  aspirin EC  81 mg Oral Daily   Chlorhexidine Gluconate Cloth  6 each Topical Q2200   finasteride  5 mg Oral Daily   influenza vaccine adjuvanted  0.5 mL Intramuscular Tomorrow-1000   metoprolol succinate  25 mg Oral Daily   midodrine  5 mg Oral Once   pantoprazole  40 mg Oral Daily   spironolactone  12.5 mg Oral Daily   Continuous Infusions:  cefTRIAXone (ROCEPHIN)  IV Stopped (11/06/22 1015)   metronidazole Stopped (11/07/22 0029)    Time  55  Rhetta Mura, MD  [facility]

## 2022-11-07 NOTE — Evaluation (Signed)
Occupational Therapy Evaluation Patient Details Name: Terry Gregory MRN: [id number] DOB: 24-Mar-1921 Today's Date: 11/07/2022   History of Present Illness 89 y.o. male brought to the ED due to AMS and fatigue, became hypothermic in ED; admitted with acute encephalopathy.  PMH: BPH, diverticulosis, pericardial effusion, hypertension, chronic diastolic heart failure, typical atrial flutter, R BBB, first-degree AV block, second-degree AV block, near syncope, elevated LFTs, osteoarthritis, history of CVA found incidentally on imaging   Clinical Impression   Patient is a 89 year old male who was admitted for above. Patient was living at home with daughter support prior level. Currently, patient is min A for transfers with L knee buckling with weight shifting forwards for simulated dressing task. Patient endorsed that L knee has been weaker one. Patient was noted to have decreased functional activity tolernace, decreased ROM, decreased BUE strength, decreased endurance, decreased sitting balance, decreased standing balanced, decreased safety awareness, and decreased knowledge of AE/AD impacting participation in ADLs. Patients daughter reported plan was to d/c home with [facility] services. Patient would continue to benefit from skilled OT services at this time while admitted  to address noted deficits in order to improve overall safety and independence in ADLs.         If plan is discharge home, recommend the following: A little help with walking and/or transfers;A little help with bathing/dressing/bathroom;Assistance with cooking/housework;Direct supervision/assist for medications management;Assist for transportation;Help with stairs or ramp for entrance;Direct supervision/assist for financial management    Functional Status Assessment  Patient has had a recent decline in their functional status and demonstrates the ability to make significant improvements in function in a reasonable and predictable amount of  time.  Equipment Recommendations  None recommended by OT       Precautions / Restrictions Restrictions Weight Bearing Restrictions: No      Mobility Bed Mobility Overal bed mobility: Needs Assistance Bed Mobility: Supine to Sit     Supine to sit: Contact guard, HOB elevated     General bed mobility comments: incr time and effort; CGA to prevent posterior LOB however pt able to self progress to  EOB    Transfers Overall transfer level: Needs assistance Equipment used: Rolling walker (2 wheels) Transfers: Sit to/from Stand, Bed to chair/wheelchair/BSC Sit to Stand: Contact guard assist, Min assist     Step pivot transfers: Min assist, Contact guard assist     General transfer comment: light guidance for anterior wt shift and to maintain COG over BOS for  transition to RW; a few steps forward and back with light assist for balance and RW      Balance Overall balance assessment: Needs assistance   Sitting balance-Leahy Scale: Fair     Standing balance support: During functional activity, Reliant on assistive device for balance Standing balance-Leahy Scale: Fair             ADL either performed or assessed with clinical judgement   ADL Overall ADL's : Needs assistance/impaired Eating/Feeding: Supervision/ safety;Set up;Sitting Eating/Feeding Details (indicate cue type and reason): noted to have trouble managing secretions of mouth with talking during session. Grooming: Supervision/safety;Sitting   Upper Body Bathing: Set up;Supervision/ safety;Sitting   Lower Body Bathing: Maximal assistance;Sitting/lateral leans;Sit to/from stand Lower Body Bathing Details (indicate cue type and reason): daguhter helps with this Upper Body Dressing : Set up;Supervision/safety;Sitting   Lower Body Dressing: Maximal assistance;Sitting/lateral leans;Sit to/from stand Lower Body Dressing Details (indicate cue type and reason): patient had L knee buckle with simulated reaching to  knees with  standing task. patient was able to stand with no BUE support to touch head and bottom with no LOB. Toilet Transfer: Minimal assistance;Stand-pivot;Rolling walker (2 wheels) Toilet Transfer Details (indicate cue type and reason): with increased time. +2 present for safety. Toileting- Clothing Manipulation and Hygiene: Maximal assistance;Sit to/from stand               Vision Baseline Vision/History: 1 Wears glasses              Pertinent Vitals/Pain Pain Assessment Pain Assessment: No/denies pain     Extremity/Trunk Assessment Upper Extremity Assessment Upper Extremity Assessment: Overall WFL for tasks assessed   Lower Extremity Assessment Lower Extremity Assessment: Defer to PT evaluation   Cervical / Trunk Assessment Cervical / Trunk Assessment: Kyphotic   Communication Communication Communication: Hearing impairment   Cognition Arousal: Alert Behavior During Therapy: WFL for tasks assessed/performed Overall Cognitive Status: History of cognitive impairments - at baseline           General Comments: HOH, endorsed memory deficits.                Home Living Family/patient expects to be discharged to:: Private residence Living Arrangements: Children Available Help at Discharge: Family;Available 24 hours/day Type of Home: House Home Access: Stairs to enter;Ramped entrance Entrance Stairs-Number of Steps: 2 or ramp; walks up/down ramp or use w/c depdending on the day and LE strength   Home Layout: One level     Bathroom Shower/Tub: Tub only   Bathroom Toilet: Handicapped height     Home Equipment: Agricultural consultant (2 wheels);Wheelchair - manual          Prior Functioning/Environment Prior Level of Function : Needs assist       Physical Assist : Mobility (physical) Mobility (physical): Transfers;Gait;Stairs   Mobility Comments: Daughter reports patient was amb with RW, needing more assistance/supervision over the last few months; pt  has had recent falls (May and June) ADLs Comments: daughter reported helping with LB dressing/bathing tasks within last two months.        OT Problem List: Decreased activity tolerance;Impaired balance (sitting and/or standing);Decreased safety awareness      OT Treatment/Interventions: Self-care/ADL training;DME and/or AE instruction;Therapeutic activities;Patient/family education;Balance training    OT Goals(Current goals can be found in the care plan section) Acute Rehab OT Goals Patient Stated Goal: to go home OT Goal Formulation: With patient/family Time For Goal Achievement: 11/21/22 Potential to Achieve Goals: Fair  OT Frequency: Min 1X/week    Co-evaluation PT/OT/SLP Co-Evaluation/Treatment: Yes Reason for Co-Treatment: To address functional/ADL transfers PT goals addressed during session: Mobility/safety with mobility        AM-PAC OT "6 Clicks" Daily Activity     Outcome Measure Help from another person eating meals?: None Help from another person taking care of personal grooming?: None Help from another person toileting, which includes using toliet, bedpan, or urinal?: A Lot Help from another person bathing (including washing, rinsing, drying)?: A Lot Help from another person to put on and taking off regular upper body clothing?: A Little Help from another person to put on and taking off regular lower body clothing?: A Lot 6 Click Score: 17   End of Session Equipment Utilized During Treatment: Gait belt;Rolling walker (2 wheels) Nurse Communication: Mobility status  Activity Tolerance: Patient tolerated treatment well Patient left: in chair;with call bell/phone within reach;with family/visitor present  OT Visit Diagnosis: Unsteadiness on feet (R26.81);Other abnormalities of gait and mobility (R26.89)  Time: 7846-9629 OT Time Calculation (min): 22 min Charges:  OT General Charges $OT Visit: 1 Visit OT Evaluation $OT Eval Low Complexity: 1  Low  Griselle Rufer OTR/L, MS Acute Rehabilitation Department Office# [phone]   Selinda Flavin 11/07/2022, 4:24 PM

## 2022-11-07 NOTE — Evaluation (Signed)
Physical Therapy Evaluation Patient Details Name: Terry Gregory MRN: [id number] DOB: 04-16-1921 Today's Date: 11/07/2022  History of Present Illness  89 y.o. male brought to the ED due to AMS and fatigue, became hypothermic in ED; admitted with acute encephalopathy.  PMH: BPH, diverticulosis, pericardial effusion, hypertension, chronic diastolic heart failure, typical atrial flutter, R BBB, first-degree AV block, second-degree AV block, near syncope, elevated LFTs, osteoarthritis, history of CVA found incidentally on imaging  Clinical Impression  Pt admitted with above diagnosis.  Pt agreeable to PT, dtr present. Pt overall min assist to CGA for bed mobility and bed to chair transfers. No dizziness, VSS. Pt will benefit from HHPT at d/c. Will follow in acute setting  Pt currently with functional limitations due to the deficits listed below (see PT Problem List). Pt will benefit from acute skilled PT to increase their independence and safety with mobility to allow discharge.           If plan is discharge home, recommend the following: A little help with walking and/or transfers;A little help with bathing/dressing/bathroom;Help with stairs or ramp for entrance;Assistance with cooking/housework;Assist for transportation   Can travel by private vehicle        Equipment Recommendations None recommended by PT  Recommendations for Other Services       Functional Status Assessment Patient has had a recent decline in their functional status and demonstrates the ability to make significant improvements in function in a reasonable and predictable amount of time.     Precautions / Restrictions        Mobility  Bed Mobility Overal bed mobility: Needs Assistance Bed Mobility: Supine to Sit     Supine to sit: Contact guard, HOB elevated     General bed mobility comments: incr time and effort; CGA to prevent posterior LOB however pt able to self progress to  EOB    Transfers Overall  transfer level: Needs assistance Equipment used: Rolling walker (2 wheels) Transfers: Sit to/from Stand, Bed to chair/wheelchair/BSC Sit to Stand: Contact guard assist, Min assist   Step pivot transfers: Min assist, Contact guard assist       General transfer comment: light guidance for anterior wt shift and to maintain COG over BOS for  transition to RW; a few steps forward and back with light assist for balance and RW    Ambulation/Gait                  Stairs            Wheelchair Mobility     Tilt Bed    Modified Rankin (Stroke Patients Only)       Balance Overall balance assessment: Needs assistance         Standing balance support: During functional activity, Reliant on assistive device for balance Standing balance-Leahy Scale: Fair                               Pertinent Vitals/Pain Pain Assessment Pain Assessment: No/denies pain    Home Living Family/patient expects to be discharged to:: Private residence Living Arrangements: Children Available Help at Discharge: Family;Available 24 hours/day Type of Home: House Home Access: Stairs to enter;Ramped entrance   Entrance Stairs-Number of Steps: 2 or ramp; walks up/down ramp or use w/c depdending on the day and LE strength   Home Layout: One level Home Equipment: Agricultural consultant (2 wheels);Wheelchair - manual      Prior Function Prior  Level of Function : Needs assist       Physical Assist : Mobility (physical) Mobility (physical): Transfers;Gait;Stairs   Mobility Comments: Daughter reports patient was amb with RW, needing more assistance/supervision over the last few months; pt has had recent falls (May and June)       Extremity/Trunk Assessment        Lower Extremity Assessment Lower Extremity Assessment: Overall WFL for tasks assessed (pt reports LLE weaker than RLE at baseline; L knee "gives way" at times)       Communication   Communication Communication:  Hearing impairment  Cognition Arousal: Alert Behavior During Therapy: WFL for tasks assessed/performed                                            General Comments      Exercises     Assessment/Plan    PT Assessment    PT Problem List         PT Treatment Interventions      PT Goals (Current goals can be found in the Care Plan section)  Acute Rehab PT Goals PT Goal Formulation: With patient Time For Goal Achievement: 11/21/22 Potential to Achieve Goals: Good    Frequency       Co-evaluation               AM-PAC PT "6 Clicks" Mobility  Outcome Measure Help needed turning from your back to your side while in a flat bed without using bedrails?: A Little Help needed moving from lying on your back to sitting on the side of a flat bed without using bedrails?: A Little Help needed moving to and from a bed to a chair (including a wheelchair)?: A Little Help needed standing up from a chair using your arms (e.g., wheelchair or bedside chair)?: A Little Help needed to walk in hospital room?: A Little Help needed climbing 3-5 steps with a railing? : A Lot 6 Click Score: 17    End of Session Equipment Utilized During Treatment: Gait belt Activity Tolerance: Patient tolerated treatment well Patient left: with call bell/phone within reach;in chair;with family/visitor present   PT Visit Diagnosis: Other abnormalities of gait and mobility (R26.89)    Time: 0981-1914 PT Time Calculation (min) (ACUTE ONLY): 16 min   Charges:   PT Evaluation $PT Eval Low Complexity: 1 Low   PT General Charges $$ ACUTE PT VISIT: 1 Visit         Phoenyx Paulsen, PT  Acute Rehab Dept [facility]) [phone]  11/07/2022   [facility] 11/07/2022, 3:10 PM

## 2022-11-08 DIAGNOSIS — G9341 Metabolic encephalopathy: Secondary | ICD-10-CM | POA: Diagnosis not present

## 2022-11-08 LAB — CULTURE, BLOOD (ROUTINE X 2)
Special Requests: ADEQUATE
Special Requests: ADEQUATE

## 2022-11-08 LAB — BASIC METABOLIC PANEL
Anion gap: 8 (ref 5–15)
BUN: 44 mg/dL — ABNORMAL HIGH (ref 8–23)
CO2: 26 mmol/L (ref 22–32)
Calcium: 9.1 mg/dL (ref 8.9–10.3)
Chloride: 102 mmol/L (ref 98–111)
Creatinine, Ser: 1.26 mg/dL — ABNORMAL HIGH (ref 0.61–1.24)
GFR, Estimated: 51 mL/min — ABNORMAL LOW (ref >60)
Glucose, Bld: 134 mg/dL — ABNORMAL HIGH (ref 70–99)
Potassium: 4.7 mmol/L (ref 3.5–5.1)
Sodium: 136 mmol/L (ref 135–145)

## 2022-11-08 LAB — CBC
HCT: 31.7 % — ABNORMAL LOW (ref 39.0–52.0)
Hemoglobin: 10.2 g/dL — ABNORMAL LOW (ref 13.0–17.0)
MCH: 33.2 pg (ref 26.0–34.0)
MCHC: 32.2 g/dL (ref 30.0–36.0)
MCV: 103.3 fL — ABNORMAL HIGH (ref 80.0–100.0)
Platelets: 147 10*3/uL — ABNORMAL LOW (ref 150–400)
RBC: 3.07 MIL/uL — ABNORMAL LOW (ref 4.22–5.81)
RDW: 15.7 % — ABNORMAL HIGH (ref 11.5–15.5)
WBC: 6.5 10*3/uL (ref 4.0–10.5)
nRBC: 0 % (ref 0.0–0.2)

## 2022-11-08 MED ORDER — SODIUM CHLORIDE 0.9 % IV SOLN: INTRAVENOUS | Status: DC

## 2022-11-08 MED ORDER — TAMSULOSIN HCL 0.4 MG PO CAPS
0.4000 mg | ORAL_CAPSULE | Freq: Every day | ORAL | Status: DC
  Administered 2022-11-08 – 2022-11-09 (×2): 0.4 mg via ORAL
  Filled 2022-11-08 (×2): qty 1

## 2022-11-08 MED ORDER — LEVOFLOXACIN 500 MG PO TABS: 500.0000 mg | ORAL_TABLET | Freq: Every day | ORAL | Status: DC

## 2022-11-08 MED ORDER — CEFDINIR 300 MG PO CAPS
300.0000 mg | ORAL_CAPSULE | Freq: Two times a day (BID) | ORAL | Status: DC
  Administered 2022-11-08 – 2022-11-09 (×2): 300 mg via ORAL
  Filled 2022-11-08 (×2): qty 1

## 2022-11-08 NOTE — Plan of Care (Signed)

## 2022-11-08 NOTE — Progress Notes (Signed)
HOSPITALIST ROUNDING NOTE Terry Gregory ZOX:[id number]  DOB: 1921/06/17  DOA: 11/05/2022  PCP: Geoffry Paradise, MD  11/08/2022,12:50 PM   LOS: 2 days      Code Status: DNR   From:  home    Current Dispo: unclear     55 male ambulates with a walker heart failure both systolic diastolic EF 45% 08/2022 admission  atrial fibrillation/flutter CHADVASC >3 with PPM (no anticoagulation)-placed 2/2 second-degree AV block 2022 Previous pericardial effusion in 2014 BPH Dysphagia ii Previous ERCP 06/24/2020 for transaminitis on admission with CBD stone Recent hospitalization 08/24/2022-08/26/2022 acute encephalopathy?  2/2 UTI with hospital acquired delirium--- his delirium apparently never really cleared and he has had intermittent confusion and hallucinations He was placed on Seroquel to take as needed but had not been taking it until the night before admission 10/2020  Admit from home 11/05/2022 fatigue-found to be fatigued Tmax 92.6 In ED pacemaker interrogated CT head negative CTA chest abdomen pelvis showed engorged enlarged area of penis secondary to false lumen from traumatic catheterization no penile abscess Urology consult and did not feel like any further workup was needed  Patient became hypothermic again and was placed on Bair hugger for Tmax of  94.5 x-ray showed aspiration on chest radiograph, patient was started on ceftriaxone Flagyl  9/26 retaining urine  Plan  Severe sepsis on admission with hypotension secondary to  aspiration in the setting of mild to moderate dementia Blood culture 9/23 negative X 2 days De-escalate to levaquin p.o. 9/26  Acute urinary retention 9/26 Bladder scan showing over 300 In-N-Out performed Continuing Proscar 5 and adding Flomax 0.4  Mild ATN/AKI secondary to retention In and out as above-hopeful for recovery Started NS 75 cc/H labs in a.m.  Systolic diastolic heart failure EF 45% 09/1189 admission Lasix 20 Aldactone 25 held Can continue metoprolol XL  25 daily  Atrial fibrillation/flutter CHADVASC >3  PPM placed secondary to second-degree AV block in 2022 does have pacemaker--- as cultures are negative no further workup at this time Continue metoprolol XL 12.5 daily  Progressive dementia stage III-IV at least Intermittent hallucinations at home ambulates several steps but has declined over the past 3 months per daughter Continue Seroquel cautiously with low-dose 25    DVT prophylaxis: SCD  Status is: Inpatient Remains inpatient appropriate because:   Requires further workup    Subjective:  Awake coherent looks overall better than prior Eating lunch Retained urine >300 this morning requiring in and out Repeat check less than 120   Objective + exam Vitals:   11/08/22 0934 11/08/22 1000 11/08/22 1152 11/08/22 1200  BP: (!) 110/52 (!) 113/51  (!) 107/54  Pulse:  66  60  Resp:  15  17  Temp:   97.7 F (36.5 C)   TempSrc:   Oral   SpO2:  93%  94%  Weight:      Height:       Filed Weights   11/06/22 1427 11/07/22 0500 11/08/22 0400  Weight: 94 kg 96.5 kg 93.5 kg    Examination:  Well-built nourished white male Pacemaker in left chest Chest is clear anteriorly and posteriorly PVCs with paced rhythm predominantly Abdomen is soft no rebound or guarding-I did not examine GU ROM is intact grossly  Data Reviewed: reviewed   CBC    Component Value Date/Time   WBC 6.5 11/08/2022 0036   RBC 3.07 (L) 11/08/2022 0036   HGB 10.2 (L) 11/08/2022 0036   HGB 12.6 (L) 03/16/2020 1017   HCT 31.7 (  L) 11/08/2022 0036   HCT 36.8 (L) 03/16/2020 1017   PLT 147 (L) 11/08/2022 0036   PLT 233 03/16/2020 1017   MCV 103.3 (H) 11/08/2022 0036   MCV 97 03/16/2020 1017   MCH 33.2 11/08/2022 0036   MCHC 32.2 11/08/2022 0036   RDW 15.7 (H) 11/08/2022 0036   RDW 11.8 03/16/2020 1017   LYMPHSABS 1.5 11/06/2022 0925   MONOABS 0.4 11/06/2022 0925   EOSABS 0.1 11/06/2022 0925   BASOSABS 0.0 11/06/2022 0925      Latest Ref Rng &  Units 11/08/2022   12:36 AM 11/06/2022    9:25 AM 11/05/2022    9:33 PM  CMP  Glucose 70 - 99 mg/dL 161  74  89   BUN 8 - 23 mg/dL 44  38  38   Creatinine 0.61 - 1.24 mg/dL 0.96  0.45  4.09   Sodium 135 - 145 mmol/L 136  136  138   Potassium 3.5 - 5.1 mmol/L 4.7  4.8  4.9   Chloride 98 - 111 mmol/L 102  103  105   CO2 22 - 32 mmol/L 26  21    Calcium 8.9 - 10.3 mg/dL 9.1  9.1    Total Protein 6.5 - 8.1 g/dL  6.9    Total Bilirubin 0.3 - 1.2 mg/dL  0.8    Alkaline Phos 38 - 126 U/L  94    AST 15 - 41 U/L  20    ALT 0 - 44 U/L  19       Scheduled Meds:  aspirin EC  81 mg Oral Daily   Chlorhexidine Gluconate Cloth  6 each Topical Q2200   finasteride  5 mg Oral Daily   influenza vaccine adjuvanted  0.5 mL Intramuscular Tomorrow-1000   levofloxacin  500 mg Oral Daily   metoprolol succinate  25 mg Oral Daily   midodrine  5 mg Oral Once   pantoprazole  40 mg Oral Daily   tamsulosin  0.4 mg Oral Daily   Continuous Infusions:  sodium chloride 75 mL/hr at 11/08/22 1200   cefTRIAXone (ROCEPHIN)  IV Stopped (11/08/22 1009)    Time  55  Rhetta Mura, MD  [facility]

## 2022-11-08 NOTE — Progress Notes (Signed)
Speech Language Pathology Treatment: Dysphagia  Patient Details Name: Terry Gregory MRN: [id number] DOB: 1922/01/30 Today's Date: 11/08/2022 Time: 1324-4010 SLP Time Calculation (min) (ACUTE ONLY): 10 min  Assessment / Plan / Recommendation Clinical Impression  SLP follow up for dysphagia management, final family education.  Pt sitting upright in bed, eating his lunch of cut up beef and greens as well as drinking tea.  He demonstrates occasional cough (x3 during meal observation) but did not cough and expectorate - but reflexive dry swallows appeared to help protect his airway.   Pt's daughter inquired re: pt's diet when dc home - which SLP advised to continue soft/cohesive foods - as pt tolerates. She also questioned regarding potential oral suctioning unit at home - recommended speak to MD if she desires unit- and she would need to clean the canister - to which she was agreeable.    Recommended to use extra caution if pt becomes acutely ill - focusing on liquid nutrition for swallow efficiency and maximal airway protection until his acute illness resolves.  Brushing with toothbrush oral cavity including teeth, tongue reviewed - pt has a tongue scraper - encouraged its use.    Daughter had no further questions re: pt's dysphagia, compensations, etc - thus SLP will sign off. Thanks for this referral.     HPI HPI: Patient is a 88 year old male admitted to [facility] with altered mental status, noted to have hallucinations - diagnosed with acute encephalopathy -concern for penile abscess, found to have incidental CVA, and pericardial effusion.  H/O Afib, memory loss percardial effusions, BPH, h/o spincherectomy with gallbladder surgery, per imaging Ovoid gas and soft tissue density within the right mainstem bronchus measuring 17 mm that is new from prior examination and may represent other mucous or an aspirated foreign body. swallow eval ordered. 08/2022 - audible aspiration without clearance with cued  cough- was placed on dys2/thin.      SLP Plan  All goals met      Recommendations for follow up therapy are one component of a multi-disciplinary discharge planning process, led by the attending physician.  Recommendations may be updated based on patient status, additional functional criteria and insurance authorization.    Recommendations  Diet recommendations: Dysphagia 3 (mechanical soft);Thin liquid Liquids provided via: Straw;Cup Medication Administration: Other (Comment) (try with nectar thick liquid - such as Ensure) Compensations: Slow rate;Small sips/bites Postural Changes and/or Swallow Maneuvers: Seated upright 90 degrees;Upright 30-60 min after meal (cough and expectorate if reflexively coughing)                  Staff/trained caregiver to provide oral care   Frequent or constant Supervision/Assistance Dysphagia, oropharyngeal phase (R13.12)     All goals met    Rolena Infante, MS [facility] SLP Acute Rehab Services Office [phone]  Chales Abrahams  11/08/2022, 2:36 PM

## 2022-11-08 NOTE — Progress Notes (Signed)
Physical Therapy Treatment Patient Details Name: Terry Gregory MRN: [id number] DOB: 1921/05/02 Today's Date: 11/08/2022   History of Present Illness 89 y.o. male brought to the ED due to AMS and fatigue, became hypothermic in ED; admitted with acute encephalopathy.  PMH: BPH, diverticulosis, pericardial effusion, hypertension, chronic diastolic heart failure, typical atrial flutter, R BBB, first-degree AV block, second-degree AV block, near syncope, elevated LFTs, osteoarthritis, history of CVA found incidentally on imaging    PT Comments  Pt requiring incr assist with bed mobility and transfers today; RN present for session, VSS & pt denies dizziness. Pt able to follow one step commands consistently with incr time.  dtr reports pt becomes more confused when she is not with him, with incr confusion pt has difficulty processing/motor planning and therefore requiring 2 assist today.  (dtr had a doctors appt this morning and was not here as early as she normally is).  Will continue to follow in acute setting. D/c plan remains appropriate.   If plan is discharge home, recommend the following: Two people to help with walking and/or transfers;Two people to help with bathing/dressing/bathroom;Assistance with cooking/housework;Assist for transportation;Help with stairs or ramp for entrance;Supervision due to cognitive status;Direct supervision/assist for medications management;Direct supervision/assist for financial management   Can travel by private vehicle        Equipment Recommendations  None recommended by PT    Recommendations for Other Services       Precautions / Restrictions Precautions Precautions: Fall Restrictions Weight Bearing Restrictions: No     Mobility  Bed Mobility Overal bed mobility: Needs Assistance Bed Mobility: Supine to Sit, Sit to Supine     Supine to sit: Min assist, Mod assist Sit to supine: Min assist, +2 for safety/equipment   General bed mobility  comments: incr time and effort; assist to progress LEs off EOB and elevate trunk; assist to control trunk descent, and lift RLE on to bed, cues to position in supine    Transfers Overall transfer level: Needs assistance Equipment used: Rolling walker (2 wheels) Transfers: Sit to/from Stand Sit to Stand: Mod assist, +2 physical assistance, +2 safety/equipment           General transfer comment: STS x2, assist wtih anterior -superior wt shift and to prevent posterior LOB in standing; cues for trunk/hip extension, use of UEs on RW deferred OOB to chair d/t incr assist needed today    Ambulation/Gait                   Stairs             Wheelchair Mobility     Tilt Bed    Modified Rankin (Stroke Patients Only)       Balance     Sitting balance-Leahy Scale: Poor     Standing balance support: Bilateral upper extremity supported, During functional activity, Reliant on assistive device for balance (reliant on device and external assist) Standing balance-Leahy Scale: Poor                              Cognition Arousal: Alert Behavior During Therapy: WFL for tasks assessed/performed Overall Cognitive Status: History of cognitive impairments - at baseline                                 General Comments: HOH, endorsed memory deficits.        Exercises  General Comments        Pertinent Vitals/Pain Pain Assessment Pain Assessment: Faces Faces Pain Scale: Hurts a little bit Pain Location: L foot (feels like has an air pocket" and bil knees Pain Descriptors / Indicators: Grimacing Pain Intervention(s): Monitored during session, Repositioned    Home Living                          Prior Function            PT Goals (current goals can now be found in the care plan section) Acute Rehab PT Goals PT Goal Formulation: With patient Time For Goal Achievement: 11/21/22 Potential to Achieve Goals:  Good Progress towards PT goals: Progressing toward goals    Frequency    Min 1X/week      PT Plan      Co-evaluation              AM-PAC PT "6 Clicks" Mobility   Outcome Measure  Help needed turning from your back to your side while in a flat bed without using bedrails?: A Lot Help needed moving from lying on your back to sitting on the side of a flat bed without using bedrails?: A Lot Help needed moving to and from a bed to a chair (including a wheelchair)?: Total Help needed standing up from a chair using your arms (e.g., wheelchair or bedside chair)?: Total Help needed to walk in hospital room?: Total Help needed climbing 3-5 steps with a railing? : Total 6 Click Score: 8    End of Session Equipment Utilized During Treatment: Gait belt Activity Tolerance: Patient limited by fatigue;Patient tolerated treatment well Patient left: in bed;with call bell/phone within reach;with bed alarm set;with family/visitor present Nurse Communication: Mobility status (RN assisting with session/+2) PT Visit Diagnosis: Other abnormalities of gait and mobility (R26.89)     Time: 1206-1232 PT Time Calculation (min) (ACUTE ONLY): 26 min  Charges:    $TA  PT General Charges $$ ACUTE PT VISIT: 1 Visit                     Delice Bison, PT  Acute Rehab Dept [facility]) [phone]  11/08/2022    [facility] 11/08/2022, 12:58 PM

## 2022-11-08 NOTE — Plan of Care (Signed)
Problem: Education: Goal: Knowledge of General Education information will improve Description: Including pain rating scale, medication(s)/side effects and non-pharmacologic comfort measures Outcome: Progressing   Problem: Health Behavior/Discharge Planning: Goal: Ability to manage health-related needs will improve Outcome: Progressing   Problem: Clinical Measurements: Goal: Ability to maintain clinical measurements within normal limits will improve Outcome: Progressing Goal: Will remain free from infection Outcome: Progressing Goal: Diagnostic test results will improve Outcome: Progressing Goal: Respiratory complications will improve Outcome: Progressing Goal: Cardiovascular complication will be avoided Outcome: Progressing   Problem: Nutrition: Goal: Adequate nutrition will be maintained Outcome: Progressing   Problem: Coping: Goal: Level of anxiety will decrease Outcome: Progressing   Problem: Activity: Goal: Risk for activity intolerance will decrease Outcome: Not Progressing

## 2022-11-09 DIAGNOSIS — G9341 Metabolic encephalopathy: Secondary | ICD-10-CM | POA: Diagnosis not present

## 2022-11-09 LAB — BASIC METABOLIC PANEL
Anion gap: 4 — ABNORMAL LOW (ref 5–15)
BUN: 25 mg/dL — ABNORMAL HIGH (ref 8–23)
CO2: 27 mmol/L (ref 22–32)
Calcium: 8.7 mg/dL — ABNORMAL LOW (ref 8.9–10.3)
Chloride: 106 mmol/L (ref 98–111)
Creatinine, Ser: 1 mg/dL (ref 0.61–1.24)
GFR, Estimated: >60 mL/min (ref >60)
Glucose, Bld: 98 mg/dL (ref 70–99)
Potassium: 3.8 mmol/L (ref 3.5–5.1)
Sodium: 137 mmol/L (ref 135–145)

## 2022-11-09 MED ORDER — ORAL CARE MOUTH RINSE: 15.0000 mL | OROMUCOSAL | Status: DC | PRN

## 2022-11-09 MED ORDER — ORAL CARE MOUTH RINSE
15.0000 mL | OROMUCOSAL | Status: DC
  Administered 2022-11-09 – 2022-11-11 (×9): 15 mL via OROMUCOSAL

## 2022-11-09 NOTE — Plan of Care (Signed)
Plan of care and goals reviewed with patient and family. Time given for questions, patient handbook/guide at bedside, patient comfort care.  Problem: Education: Goal: Knowledge of General Education information will improve Description: Including pain rating scale, medication(s)/side effects and non-pharmacologic comfort measures Outcome: Progressing   Problem: Health Behavior/Discharge Planning: Goal: Ability to manage health-related needs will improve Outcome: Progressing   Problem: Clinical Measurements: Goal: Ability to maintain clinical measurements within normal limits will improve Outcome: Progressing Goal: Will remain free from infection Outcome: Progressing Goal: Diagnostic test results will improve Outcome: Progressing Goal: Respiratory complications will improve Outcome: Progressing Goal: Cardiovascular complication will be avoided Outcome: Progressing   Problem: Activity: Goal: Risk for activity intolerance will decrease Outcome: Progressing   Problem: Nutrition: Goal: Adequate nutrition will be maintained Outcome: Progressing   Problem: Coping: Goal: Level of anxiety will decrease Outcome: Progressing   Problem: Elimination: Goal: Will not experience complications related to bowel motility Outcome: Progressing Goal: Will not experience complications related to urinary retention Outcome: Progressing   Problem: Pain Managment: Goal: General experience of comfort will improve Outcome: Progressing   Problem: Safety: Goal: Ability to remain free from injury will improve Outcome: Progressing   Problem: Skin Integrity: Goal: Risk for impaired skin integrity will decrease Outcome: Progressing

## 2022-11-09 NOTE — Progress Notes (Signed)
HOSPITALIST ROUNDING NOTE Terry Gregory MVH:[id number]  DOB: Apr 02, 1921  DOA: 11/05/2022  PCP: Geoffry Paradise, MD  11/09/2022,2:25 PM   LOS: 3 days      Code Status: DNR   From:  home    Current Dispo: unclear     30 male ambulates with a walker heart failure both systolic diastolic EF 45% 08/2022 admission  atrial fibrillation/flutter CHADVASC >3 with PPM (no anticoagulation)-placed 2/2 second-degree AV block 2022 Previous pericardial effusion in 2014 BPH Dysphagia ii Previous ERCP 06/24/2020 for transaminitis on admission with CBD stone Recent hospitalization 08/24/2022-08/26/2022 acute encephalopathy?  2/2 UTI with hospital acquired delirium--- his delirium apparently never really cleared and he has had intermittent confusion and hallucinations He was placed on Seroquel to take as needed but had not been taking it until the night before admission 10/2020  Admit from home 11/05/2022 fatigue-found to be fatigued Tmax 92.6 In ED pacemaker interrogated CT head negative CTA chest abdomen pelvis showed engorged enlarged area of penis secondary to false lumen from traumatic catheterization no penile abscess Urology consult and did not feel like any further workup was needed  Patient became hypothermic again and was placed on Bair hugger for Tmax of  94.5 x-ray showed aspiration on chest radiograph, patient was started on ceftriaxone Flagyl  9/26 retaining urine 9/27 persistent hypothermia--decision to make full comfort  Plan  Severe sepsis on admission with hypotension secondary to  aspiration in the setting of mild to moderate dementia Patient daughter and HCPOA and I had a long discussion--he was lethargic overnight, now has a Temp of 94 rectally.  He shows signs of severe sepsis and this is not reversible.  Daughter familiar with Hospice philosophy--elects to pursue full comfort Depending on rapidity of decline, may be able to transfer to Free standing hospice.  Will place referral---if  declines precipitously, patient might die in hospital  Acute urinary retention 9/26 Stop bladder meds--Keep foley for comfort  Mild ATN/AKI secondary to retention Stop IV fluid  Systolic diastolic heart failure EF 45% 09/4130 admission Stop all meds occasions  Atrial fibrillation/flutter CHADVASC >3  PPM placed secondary to second-degree AV block in 2022--- no defibrillator If becomes unresponsive will need magnet to turn it off  Progressive dementia stage III-IV at least Stop Seroquel   -Full comfort trajectory now-watch overnight in stepdown if bed is needed can transfer upstairs-comfort card- > 60 minutes advance care planning time in addition to discussion with family    DVT prophylaxis: SCD  Status is: Inpatient Remains inpatient appropriate because:   Requires further workup    Subjective:  Lethargic less responsive Later in the morning I was told he was hypothermic placed back on Bair hugger and this afternoon at around 230 blood pressure has dropped into the 80s systolic This is a big change from yesterday I fear he is aspirating   Objective + exam Vitals:   11/09/22 1100 11/09/22 1115 11/09/22 1200 11/09/22 1251  BP:  (!) 121/59 118/70   Pulse: 60 60 60   Resp: 13  15   Temp:   (!) 94.6 F (34.8 C) (!) 95.9 F (35.5 C)  TempSrc:   Rectal Axillary  SpO2: 95%  95%   Weight:      Height:       Filed Weights   11/07/22 0500 11/08/22 0400 11/09/22 0400  Weight: 96.5 kg 93.5 kg 91.8 kg    Examination:  Well-built nourished white male Lethargic, GCS about 12 Abdomen soft    Data Reviewed:  reviewed   CBC    Component Value Date/Time   WBC 6.5 11/08/2022 0036   RBC 3.07 (L) 11/08/2022 0036   HGB 10.2 (L) 11/08/2022 0036   HGB 12.6 (L) 03/16/2020 1017   HCT 31.7 (L) 11/08/2022 0036   HCT 36.8 (L) 03/16/2020 1017   PLT 147 (L) 11/08/2022 0036   PLT 233 03/16/2020 1017   MCV 103.3 (H) 11/08/2022 0036   MCV 97 03/16/2020 1017   MCH 33.2  11/08/2022 0036   MCHC 32.2 11/08/2022 0036   RDW 15.7 (H) 11/08/2022 0036   RDW 11.8 03/16/2020 1017   LYMPHSABS 1.5 11/06/2022 0925   MONOABS 0.4 11/06/2022 0925   EOSABS 0.1 11/06/2022 0925   BASOSABS 0.0 11/06/2022 0925      Latest Ref Rng & Units 11/09/2022    8:07 AM 11/08/2022   12:36 AM 11/06/2022    9:25 AM  CMP  Glucose 70 - 99 mg/dL 98  604  74   BUN 8 - 23 mg/dL 25  44  38   Creatinine 0.61 - 1.24 mg/dL 5.40  9.81  1.91   Sodium 135 - 145 mmol/L 137  136  136   Potassium 3.5 - 5.1 mmol/L 3.8  4.7  4.8   Chloride 98 - 111 mmol/L 106  102  103   CO2 22 - 32 mmol/L 27  26  21    Calcium 8.9 - 10.3 mg/dL 8.7  9.1  9.1   Total Protein 6.5 - 8.1 g/dL   6.9   Total Bilirubin 0.3 - 1.2 mg/dL   0.8   Alkaline Phos 38 - 126 U/L   94   AST 15 - 41 U/L   20   ALT 0 - 44 U/L   19      Scheduled Meds:  aspirin EC  81 mg Oral Daily   influenza vaccine adjuvanted  0.5 mL Intramuscular Tomorrow-1000   midodrine  5 mg Oral Once   mouth rinse  15 mL Mouth Rinse 4 times per day   Continuous Infusions:    Time  55  Rhetta Mura, MD  [facility]

## 2022-11-09 NOTE — Progress Notes (Signed)
Chaplain engaged in an initial visit with Terry Gregory and future son-in-law. Chaplain provided space for narrative life review and reflective listening. 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 Terry Gregory was able to retire by 78 and enjoyed a joyful life of being a father, a husband, farming, gardening, and playing sports. Terry Gregory loved baseball and would often teach his children and nieces and nephews how to play sports as well. He valued being active and involved. His son-in-law shared that Terry Gregory was still mowing his lawn well into 89 years old. Early was married for 68 years before his dear wife passed. They met by living in the same neighborhood.   Terry Gregory celebrated her father's life. She was her mom and dad's main caregiver. She is thinking deeply about them reuniting.  Chaplain provided prayer over Terry Gregory with family at bedside. Terry Gregory was a part of a dynamic church for years and his pastor has regularly visited him. He continued to read scripture up until now.     11/09/22 1300  Spiritual Encounters  Type of Visit Initial  Care provided to: Pt and family  Reason for visit End-of-life  Spiritual Framework  Presenting Themes Community and relationships;Rituals and practive;Meaning/purpose/sources of inspiration;Impactful experiences and emotions  Interventions  Spiritual Care Interventions Made Prayer;Reflective listening;Normalization of emotions;Compassionate presence;Established relationship of care and support;Narrative/life review  Intervention Outcomes  Outcomes Awareness of support;Connection to spiritual care

## 2022-11-09 NOTE — Plan of Care (Signed)

## 2022-11-09 NOTE — TOC Initial Note (Signed)
Transition of Care [facility]) - Initial/Assessment Note    Patient Details  Name: Terry Gregory MRN: [id number] Date of Birth: [birth year]  Transition of Care [facility]) CM/SW Contact:    Harriett Sine, RN Phone Number: 11/09/2022, 2:54 PM  Clinical Narrative:                 Pt from home,has no SDOH needs, pt referred to [facility] hospice. TOC following  Expected Discharge Plan: Hospice Medical Facility Barriers to Discharge: No Barriers Identified   Patient Goals and CMS Choice Patient states their goals for this hospitalization and ongoing recovery are:: not clear   Choice offered to / list presented to : Adult Children      Expected Discharge Plan and Services       Living arrangements for the past 2 months: Single Family Home                                      Prior Living Arrangements/Services Living arrangements for the past 2 months: Single Family Home Lives with:: Adult Children   Do you feel safe going back to the place where you live?: Yes      Need for Family Participation in Patient Care: Yes (Comment) Care giver support system in place?: Yes (comment) (Adult children)   Criminal Activity/Legal Involvement Pertinent to Current Situation/Hospitalization: No - Comment as needed  Activities of Daily Living   ADL Screening (condition at time of admission) Does the patient have a NEW difficulty with bathing/dressing/toileting/self-feeding that is expected to last >3 days?: Yes (Initiates electronic notice to provider for possible OT consult) Does the patient have a NEW difficulty with getting in/out of bed, walking, or climbing stairs that is expected to last >3 days?: Yes (Initiates electronic notice to provider for possible PT consult) Does the patient have a NEW difficulty with communication that is expected to last >3 days?: No Is the patient deaf or have difficulty hearing?: Yes Does the patient have difficulty seeing, even when wearing  glasses/contacts?: No Does the patient have difficulty concentrating, remembering, or making decisions?: Yes  Permission Sought/Granted Permission sought to share information with : Oceanographer granted to share information with : Yes, Verbal Permission Granted              Emotional Assessment Appearance:: Appears stated age Attitude/Demeanor/Rapport: Lethargic Affect (typically observed): Appropriate Orientation: : Oriented to Self Alcohol / Substance Use: Never Used Psych Involvement: No (comment)  Admission diagnosis:  Macrocytic anemia [D53.9] Hypothermia, initial encounter [T68.XXXA] Acute metabolic encephalopathy [G93.41] Fatigue, unspecified type [R53.83] Patient Active Problem List   Diagnosis Date Noted   Macrocytic anemia 11/06/2022   Mild protein malnutrition (HCC) 11/06/2022   Hypothermia 11/06/2022   Penile lesion 11/06/2022   Acute on chronic heart failure with preserved ejection fraction (HFpEF) (HCC) 08/25/2022   UTI (urinary tract infection) 08/25/2022   Metabolic encephalopathy 08/24/2022   Acute metabolic encephalopathy 08/24/2022   Near syncope 06/23/2020   BPH (benign prostatic hyperplasia) 06/23/2020   Elevated LFTs 06/22/2020   Second degree AV block, Mobitz type I 11/03/2016   Typical atrial flutter (HCC) 10/06/2015   Arthralgia of both knees 07/28/2013   Pericardial effusion 09/30/2012   RBBB and first degree atrioventricular block 09/30/2012   HTN (hypertension) 09/30/2012   PCP:  Geoffry Paradise, MD Pharmacy:   [facility] [place], [place] - [address] [place] [address] Phone: [phone] Fax: [phone]     Social Determinants of Health (SDOH) Social History: SDOH Screenings   Food Insecurity: No Food Insecurity (11/06/2022)  Housing: Low Risk  (11/06/2022)  Transportation Needs: No Transportation Needs (11/06/2022)  Utilities: Not At  Risk (11/06/2022)  Tobacco Use: Low Risk  (11/06/2022)   SDOH Interventions:     Readmission Risk Interventions    08/26/2022   11:30 AM  Readmission Risk Prevention Plan  Post Dischage Appt Complete  Medication Screening Complete  Transportation Screening Complete

## 2022-11-10 DIAGNOSIS — G9341 Metabolic encephalopathy: Secondary | ICD-10-CM | POA: Diagnosis not present

## 2022-11-10 NOTE — Progress Notes (Signed)
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  2/2 UTI with hospital acquired delirium--- his delirium apparently never really cleared and he has had intermittent confusion and hallucinations He was placed on Seroquel to take as needed but had not been taking it until the night before admission 10/2020  Admit from home 11/05/2022 fatigue-found to be fatigued Tmax 92.6 In ED pacemaker interrogated CT head negative CTA chest abdomen pelvis showed engorged enlarged area of penis secondary to false lumen from traumatic catheterization no penile abscess Urology consult and did not feel like any further workup was needed  Patient became hypothermic again and was placed on Bair hugger for Tmax of  94.5 x-ray showed aspiration on chest radiograph, patient was started on ceftriaxone Flagyl  9/26 retaining urine 9/27 persistent hypothermia--decision to make full comfort  Plan  Severe sepsis on admission with hypotension hypothermia and lethargy Had vacillating course over 9/26 through 9/28 with ups and downs in clinical condition and now fully alert Not a candidate for freestanding hospice currently as he is much more alert than he was  however  has had a significant decline since 08/2022 with intermittent hallucinations and worsening dementia Appreciative of hospice input-daughter is electing to continue full comfort trajectory at home as patient  is clearly no longer a freestanding hospice candidate I will order hospital bed and transfer the patient to a MedSurg unit  Acute urinary retention 9/26 Stop bladder meds--Keep foley for comfort  Mild ATN/AKI secondary to retention Stop IV fluid  Systolic diastolic heart failure EF 45% 10/5619 admission Stop all meds not consistent with comfort trajectory  Atrial fibrillation/flutter CHADVASC >3  PPM placed secondary to second-degree AV block in 2022--- no defibrillator If becomes unresponsive will need magnet to turn it off  Progressive dementia stage III-IV at least Stop Seroquel unless very combative He does not at present require anxiolytics or meds for air hunger  -Full comfort trajectory remains enforce Transferred to MedSurg unit  Long discussion with the daughter at the bedside in person and on the phone this morning-as HCPOA she does not want aggressive measures and maintains that she wants to see if we can get him home with hospice in the next 24-48   DVT prophylaxis: SCD  Status is: Inpatient Remains inpatient appropriate because:   Requires further workup    Subjective:  Much more awake and coherent asking to eat tolerating dysphagia 3 diet I had a clear conversation with him this morning and although he is confused about date and president-he is able to tell me the year and the month He understands no CODE BLUE and I have reiterated to him we would not do aggressive care See discussion above with patient's daughter   Objective + exam Vitals:   11/09/22 1900 11/09/22 2000 11/09/22 2311 11/10/22 0430  BP:  105/60  (!) 96/57  Pulse: 60 (!) 59  60  Resp: 17 13  11   Temp:  98.1 F (36.7 C)  98.3 F (36.8 C) 98.3 F (36.8 C)  TempSrc:  Axillary Oral Oral  SpO2: 93% 93%  94%  Weight:      Height:       Filed Weights   11/07/22 0500 11/08/22 0400 11/09/22 0400  Weight: 96.5 kg 93.5 kg 91.8 kg    Examination:  Well-built nourished white male More  awake alert Eating breakfast the second time I saw him this morning Chest is clear No lower extremity edema Neuro intact    Data Reviewed: reviewed   CBC    Component Value Date/Time   WBC 6.5 11/08/2022 0036   RBC 3.07 (L) 11/08/2022 0036   HGB 10.2 (L) 11/08/2022 0036   HGB 12.6 (L) 03/16/2020 1017   HCT 31.7 (L) 11/08/2022 0036   HCT 36.8 (L) 03/16/2020 1017   PLT 147 (L) 11/08/2022 0036   PLT 233 03/16/2020 1017   MCV 103.3 (H) 11/08/2022 0036   MCV 97 03/16/2020 1017   MCH 33.2 11/08/2022 0036   MCHC 32.2 11/08/2022 0036   RDW 15.7 (H) 11/08/2022 0036   RDW 11.8 03/16/2020 1017   LYMPHSABS 1.5 11/06/2022 0925   MONOABS 0.4 11/06/2022 0925   EOSABS 0.1 11/06/2022 0925   BASOSABS 0.0 11/06/2022 0925      Latest Ref Rng & Units 11/09/2022    8:07 AM 11/08/2022   12:36 AM 11/06/2022    9:25 AM  CMP  Glucose 70 - 99 mg/dL 98  161  74   BUN 8 - 23 mg/dL 25  44  38   Creatinine 0.61 - 1.24 mg/dL 0.96  0.45  4.09   Sodium 135 - 145 mmol/L 137  136  136   Potassium 3.5 - 5.1 mmol/L 3.8  4.7  4.8   Chloride 98 - 111 mmol/L 106  102  103   CO2 22 - 32 mmol/L 27  26  21    Calcium 8.9 - 10.3 mg/dL 8.7  9.1  9.1   Total Protein 6.5 - 8.1 g/dL   6.9   Total Bilirubin 0.3 - 1.2 mg/dL   0.8   Alkaline Phos 38 - 126 U/L   94   AST 15 - 41 U/L   20   ALT 0 - 44 U/L   19      Scheduled Meds:  aspirin EC  81 mg Oral Daily   mouth rinse  15 mL Mouth Rinse 4 times per day   Continuous Infusions:    Time  55  Rhetta Mura, MD  [facility]

## 2022-11-10 NOTE — Progress Notes (Signed)
[facility] Liaison Note  Received request from [place], Transitions of Care Manager, for hospice services at home after discharge. Spoke with patient and Larene Beach, daughter to initiate education related to hospice philosophy, services, and team approach to care. Patient/family verbalized understanding of information given. Per discussion, the plan is for discharge home possibly tomorrow.   DME needs discussed. Patient has a rollator walker, bedside commode, transport chair, shower transfer bench, and overbed table in the home. Patient/family requests a hospital bed delivery. The address has been verified and is correct in the chart. Vickie is the family contact to arrange time of equipment delivery.  Please send signed and completed DNR home with patient/family. Please provide prescriptions at discharge as needed to ensure ongoing symptom management.   [facility] information and contact numbers given to Vickie. Please call with any questions or concerns.   Thank you for the opportunity to participate in this patient's care.   Glenna Fellows BSN, Charity fundraiser, OCN [employer] [phone]

## 2022-11-10 NOTE — Plan of Care (Signed)
  Problem: Education: Goal: Knowledge of General Education information will improve Description: Including pain rating scale, medication(s)/side effects and non-pharmacologic comfort measures Outcome: Progressing   Problem: Health Behavior/Discharge Planning: Goal: Ability to manage health-related needs will improve Outcome: Progressing   Problem: Clinical Measurements: Goal: Will remain free from infection Outcome: Progressing Goal: Cardiovascular complication will be avoided Outcome: Progressing   Problem: Coping: Goal: Level of anxiety will decrease Outcome: Progressing   Problem: Elimination: Goal: Will not experience complications related to bowel motility Outcome: Progressing Goal: Will not experience complications related to urinary retention Outcome: Progressing   Problem: Pain Managment: Goal: General experience of comfort will improve Outcome: Progressing   Problem: Safety: Goal: Ability to remain free from injury will improve Outcome: Progressing   Problem: Skin Integrity: Goal: Risk for impaired skin integrity will decrease Outcome: Progressing

## 2022-11-11 DIAGNOSIS — G9341 Metabolic encephalopathy: Secondary | ICD-10-CM | POA: Diagnosis not present

## 2022-11-11 LAB — CULTURE, BLOOD (ROUTINE X 2)
Culture: NO GROWTH
Culture: NO GROWTH

## 2022-11-11 MED ORDER — ORAL CARE MOUTH RINSE
15.0000 mL | Freq: Four times a day (QID) | OROMUCOSAL | Status: DC
  Administered 2022-11-11 – 2022-11-12 (×2): 15 mL via OROMUCOSAL

## 2022-11-11 MED ORDER — ORAL CARE MOUTH RINSE: 15.0000 mL | OROMUCOSAL | Status: DC | PRN

## 2022-11-11 MED ORDER — ASPIRIN 81 MG PO TBEC
81.0000 mg | DELAYED_RELEASE_TABLET | Freq: Every day | ORAL | Status: DC
  Administered 2022-11-12: 81 mg via ORAL
  Filled 2022-11-11: qty 1

## 2022-11-11 NOTE — TOC Progression Note (Addendum)
Transition of Care [facility]) - Progression Note    Patient Details  Name: Jaret Coppedge MRN: [id number] Date of Birth: 09/27/1921  [facility]) CM/SW Contact  Larrie Kass, LCSW Phone Number: 11/11/2022, 12:00 PM  Clinical Narrative:     CSW spoke with pt's daughter Larene Beach , she stated pt's hospital bed has not arrived yet. Pt's daughter stated she doesn't know when the bed will arrive.  Attempted to call [facility] hospital liaison , left VM requesting return call. TOC to follow.   Adden  12:45pm CSW received message from [facility] liaison with [facility], Pt's hospital bed is scheduled to be delivered tomorrow. TOC to follow.   Expected Discharge Plan: Hospice Medical Facility Barriers to Discharge: No Barriers Identified  Expected Discharge Plan and Services       Living arrangements for the past 2 months: Single Family Home Expected Discharge Date: 11/11/22                                     Social Determinants of Health (SDOH) Interventions SDOH Screenings   Food Insecurity: No Food Insecurity (11/06/2022)  Housing: Low Risk  (11/06/2022)  Transportation Needs: No Transportation Needs (11/06/2022)  Utilities: Not At Risk (11/06/2022)  Tobacco Use: Low Risk  (11/06/2022)    Readmission Risk Interventions    08/26/2022   11:30 AM  Readmission Risk Prevention Plan  Post Dischage Appt Complete  Medication Screening Complete  Transportation Screening Complete

## 2022-11-11 NOTE — Plan of Care (Signed)

## 2022-11-11 NOTE — Plan of Care (Signed)

## 2022-11-11 NOTE — Discharge Summary (Signed)
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 (no defibrillator) Adult frailty  Please see below for itemized issues addressed in HOpsital- refer to other progress notes for clarity if needed  Discharge Condition: Fair  Diet recommendation: Comfort  Filed Weights   11/08/22 0400 11/09/22 0400 11/11/22 0555  Weight: 93.5 kg 91.8 kg 94.8 kg    History of present illness:   67 male ambulates with a walker but has not ambulated since recent admission 08/2022 properly heart failure both systolic diastolic EF 45% 08/2022 admission  atrial fibrillation/flutter CHADVASC >3 with PPM (no anticoagulation)-placed 2/2 second-degree AV block 2022 Previous pericardial effusion in 2014 BPH Dysphagia ii Previous ERCP 06/24/2020 for transaminitis on admission with CBD stone Recent hospitalization 08/24/2022-08/26/2022 acute encephalopathy?  2/2 UTI with hospital acquired delirium--- his delirium apparently never really cleared and he has had intermittent confusion and hallucinations He was placed on Seroquel to take as needed but had not been taking it until the night before admission 10/2020   Admit from home 11/05/2022 fatigue-found to be fatigued Tmax 92.6 In ED pacemaker interrogated CT head negative CTA chest abdomen pelvis showed engorged enlarged area of penis secondary to false lumen from traumatic catheterization no penile abscess Urology consult and did not feel like any further workup was needed    Patient became hypothermic again and was placed on Bair hugger for Tmax of  94.5 x-ray showed aspiration on chest radiograph, patient was started on ceftriaxone Flagyl   9/26 retaining urine 9/27 persistent hypothermia--decision to make full comfort  Hospital Course:  Severe sepsis on admission with hypotension hypothermia and lethargy Had vacillating course over 9/26 through 9/28 with ups and downs in clinical condition and now fully alert Not a candidate for freestanding hospice currently as he is much more alert than he was  however  has had a significant decline since 08/2022 with intermittent hallucinations and worsening dementia I had multiple repeat discussions to clarify goals of care with family members and his Sheryle Hail who the patient actually has been living with I discussed his vacillating course and risk for recurrence but given the fact that he is feeling better now I think home hospice is quite appropriate-she will think about do not hospitalize and prefers to keep him at home based on my discussion with her at day of discharge We are awaiting a hospital bed but patient can be transported by [facility] once hospital bed is confirmed   Acute urinary retention 9/26 Stop bladder meds--Keep foley for comfort-given that he is relatively nonambulatory patient's family prefers to keep the Foley in place   Mild ATN/AKI secondary to retention Stop IV fluid-would not check labs going for   Systolic diastolic heart failure EF 45% 10/5619 admission Elected to place on every other day Lasix and metoprolol which can be discontinued subsequently by hospice if they feel patient makes a decline   Atrial fibrillation/flutter CHADVASC >3  PPM placed secondary to second-degree AV block in 2022--- no defibrillator If becomes unresponsive will need magnet to turn it off   Progressive dementia stage III-IV  at least Stop Seroquel given its side effects in the patient to make him very somnolent he  is not really been combative he is actually quite oriented today but this does vacillate-hospice physician to make decisions with regards to comfort meds at discharge-DNR signed   Discharge Exam: Vitals:   11/10/22 1230 11/11/22 0555  BP: (!) 97/53 136/74  Pulse: 60 60  Resp: 16 14  Temp:  (!) 97.5 F (36.4 C)  SpO2: 94% 97%    Subj on day of d/c   Awake more coherent eating breakfast can recognize daughter can tell me year and place  General Exam on discharge  Chest clear no wheeze rales rhonchi No lymphadenopathy Abdomen soft No lower extremity edema Moving all 4 limbs  Discharge Instructions    Allergies as of 11/11/2022       Reactions   Erythromycin Hives, Rash   Penicillins Hives, Rash   Has tolerated cephalosporins        Medication List     STOP taking these medications    aspirin EC 81 MG tablet   finasteride 5 MG tablet Commonly known as: PROSCAR   omeprazole 20 MG capsule Commonly known as: PRILOSEC   QUEtiapine 25 MG tablet Commonly known as: SEROQUEL   spironolactone 25 MG tablet Commonly known as: ALDACTONE       TAKE these medications    furosemide 20 MG tablet Commonly known as: Lasix Take 1 tablet (20 mg total) by mouth every morning.   metoprolol succinate 25 MG 24 hr tablet Commonly known as: Toprol XL Take 1 tablet (25 mg total) by mouth daily.               Durable Medical Equipment  (From admission, onward)           Start     Ordered   11/10/22 1130  For home use only DME Hospital bed  Once       Question Answer Comment  Length of Need Lifetime   The above medical condition requires: Patient requires the ability to reposition frequently   Bed type Semi-electric   Support Surface: Low Air loss Mattress      11/10/22 1129           Allergies  Allergen Reactions   Erythromycin Hives and Rash   Penicillins Hives and Rash    Has tolerated cephalosporins      The results of significant  diagnostics from this hospitalization (including imaging, microbiology, ancillary and laboratory) are listed below for reference.    Significant Diagnostic Studies: CT Angio Chest PE W and/or Wo Contrast  Result Date: 11/06/2022 CLINICAL DATA:  Altered mental status, sepsis, fatigue. Pulmonary embolism (PE) suspected, high prob; Abdominal pain, acute, nonlocalized EXAM: CT ANGIOGRAPHY CHEST CT ABDOMEN AND PELVIS WITH CONTRAST TECHNIQUE: Multidetector CT imaging of the chest was performed using the standard protocol during bolus administration of intravenous contrast. Multiplanar CT image reconstructions and MIPs were obtained to evaluate the vascular anatomy. Multidetector CT imaging of the abdomen and pelvis was performed using the standard protocol during bolus administration of intravenous contrast. RADIATION DOSE REDUCTION: This exam was performed according to the departmental dose-optimization program which includes automated exposure control, adjustment of the mA and/or kV according to patient size and/or use of iterative reconstruction technique. CONTRAST:  80mL OMNIPAQUE IOHEXOL 350 MG/ML SOLN COMPARISON:  CT chest 06/23/2022, CT abdomen pelvis 06/22/2020 FINDINGS: CTA CHEST FINDINGS Cardiovascular: There is adequate opacification of the pulmonary arterial tree. No intraluminal  filling defect identified to suggest acute pulmonary embolism. The central pulmonary arteries are enlarged in keeping with changes of pulmonary arterial hypertension. Mild coronary artery calcification. Mild global cardiomegaly. 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 No pericardial effusion. The thoracic aorta demonstrates fusiform dilation measuring 4.1 cm in maximal diameter within the ascending aorta, 4.0 cm in diameter within the proximal descending aorta just distal to the arch, and 3.3 cm in diameter distally at the level of the left atrium. Mild  atherosclerotic calcification within the thoracic aorta. Mediastinum/Nodes: Visualized thyroid is unremarkable. No pathologic thoracic adenopathy. Esophagus unremarkable. Small hiatal hernia. Lungs/Pleura: Mild bibasilar atelectasis. Mild biapical subpleural minimal fibrotic change. Lungs are otherwise clear. No pneumothorax. Trace bilateral pleural effusions. Ovoid gas and soft tissue density within the right mainstem bronchus measuring 17 mm at axial image # 60/12 is new from prior examination and may represent adherent mucus though an aspirated foreign body could appear similarly. Musculoskeletal: Acid structures are age-appropriate. No acute bone abnormality. No lytic or blastic bone lesion. Review of the MIP images confirms the above findings. CT ABDOMEN and PELVIS FINDINGS Hepatobiliary: Status post cholecystectomy. Nondependent pneumobilia likely relate to changes of sphincterotomy. No intra or extrahepatic biliary ductal dilation. Liver otherwise unremarkable. Pancreas: Unremarkable Spleen: Unremarkable Adrenals/Urinary Tract: Adrenal glands are unremarkable. Kidneys are normal, without renal calculi, focal lesion, or hydronephrosis. Bladder is unremarkable. Stomach/Bowel: Moderate to severe pancolonic diverticulosis, most severe within the sigmoid colon, without superimposed acute inflammatory change. The stomach, and large bowel are otherwise unremarkable. No evidence of obstruction. No free intraperitoneal gas or fluid. Appendectomy has been performed. Vascular/Lymphatic: Aortic atherosclerosis. No enlarged abdominal or pelvic lymph nodes. Reproductive: Marked prostatic hypertrophy. Tubular gas and fluid collection has developed within the corpus spongiosum of the penis measuring up to 15 mm in diameter and extending along the course of the expected penile and bulbar urethra, new since prior examination. This may represent dilation of the a urethra secondary to a peripheral stricture within the penile  urethra, a false lumen potentially related to traumatic catheterization, or a penile abscess. Other: No abdominal wall hernia or abnormality. No abdominopelvic ascites. Musculoskeletal: Right total hip arthroplasty has been performed. Osseous structures are age-appropriate. No acute bone abnormality. No lytic or blastic bone lesion. Review of the MIP images confirms the above findings. IMPRESSION: 1. No pulmonary embolism. 2. Pulmonary arterial hypertension. 3. Mild coronary artery calcification. Mild global cardiomegaly. 4. Trace bilateral pleural effusions. 5. Ovoid gas and soft tissue density within the right mainstem bronchus measuring 17 mm is new from prior examination and may represent adherent mucus though an aspirated foreign body could appear similarly. 6. Moderate to severe pancolonic diverticulosis without superimposed acute inflammatory change. 7. Marked prostatic hypertrophy. 8. Tubular fluid collection within the corpus spongiosum of the penis, new since prior examination, in keeping with a distended anterior urethra, false lumen secondary to traumatic catheterization, or a penile abscess. Aortic Atherosclerosis (ICD10-I70.0). Electronically Signed   By: Helyn Numbers M.D.   On: 11/06/2022 01:15   CT ABDOMEN PELVIS W CONTRAST  Result Date: 11/06/2022 CLINICAL DATA:  Altered mental status, sepsis, fatigue. Pulmonary embolism (PE) suspected, high prob; Abdominal pain, acute, nonlocalized EXAM: CT ANGIOGRAPHY CHEST CT ABDOMEN AND PELVIS WITH CONTRAST TECHNIQUE: Multidetector CT imaging of the chest was performed using the standard protocol during bolus administration of intravenous contrast. Multiplanar CT image reconstructions and MIPs were obtained to evaluate the vascular anatomy. Multidetector CT imaging  of the abdomen and pelvis was performed using the standard protocol during bolus administration of intravenous contrast. RADIATION DOSE REDUCTION: This exam was performed according to the  departmental dose-optimization program which includes automated exposure control, adjustment of the mA and/or kV according to patient size and/or use of iterative reconstruction technique. CONTRAST:  80mL OMNIPAQUE IOHEXOL 350 MG/ML SOLN COMPARISON:  CT chest 06/23/2022, CT abdomen pelvis 06/22/2020 FINDINGS: CTA CHEST FINDINGS Cardiovascular: There is adequate opacification of the pulmonary arterial tree. No intraluminal filling defect identified to suggest acute pulmonary embolism. The central pulmonary arteries are enlarged in keeping with changes of pulmonary arterial hypertension. Mild coronary artery calcification. Mild global cardiomegaly. 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 No pericardial effusion. The thoracic aorta demonstrates fusiform dilation measuring 4.1 cm in maximal diameter within the ascending aorta, 4.0 cm in diameter within the proximal descending aorta just distal to the arch, and 3.3 cm in diameter distally at the level of the left atrium. Mild atherosclerotic calcification within the thoracic aorta. Mediastinum/Nodes: Visualized thyroid is unremarkable. No pathologic thoracic adenopathy. Esophagus unremarkable. Small hiatal hernia. Lungs/Pleura: Mild bibasilar atelectasis. Mild biapical subpleural minimal fibrotic change. Lungs are otherwise clear. No pneumothorax. Trace bilateral pleural effusions. Ovoid gas and soft tissue density within the right mainstem bronchus measuring 17 mm at axial image # 60/12 is new from prior examination and may represent adherent mucus though an aspirated foreign body could appear similarly. Musculoskeletal: Acid structures are age-appropriate. No acute bone abnormality. No lytic or blastic bone lesion. Review of the MIP images confirms the above findings. CT ABDOMEN and PELVIS FINDINGS Hepatobiliary: Status post cholecystectomy. Nondependent pneumobilia likely relate to changes of  sphincterotomy. No intra or extrahepatic biliary ductal dilation. Liver otherwise unremarkable. Pancreas: Unremarkable Spleen: Unremarkable Adrenals/Urinary Tract: Adrenal glands are unremarkable. Kidneys are normal, without renal calculi, focal lesion, or hydronephrosis. Bladder is unremarkable. Stomach/Bowel: Moderate to severe pancolonic diverticulosis, most severe within the sigmoid colon, without superimposed acute inflammatory change. The stomach, and large bowel are otherwise unremarkable. No evidence of obstruction. No free intraperitoneal gas or fluid. Appendectomy has been performed. Vascular/Lymphatic: Aortic atherosclerosis. No enlarged abdominal or pelvic lymph nodes. Reproductive: Marked prostatic hypertrophy. Tubular gas and fluid collection has developed within the corpus spongiosum of the penis measuring up to 15 mm in diameter and extending along the course of the expected penile and bulbar urethra, new since prior examination. This may represent dilation of the a urethra secondary to a peripheral stricture within the penile urethra, a false lumen potentially related to traumatic catheterization, or a penile abscess. Other: No abdominal wall hernia or abnormality. No abdominopelvic ascites. Musculoskeletal: Right total hip arthroplasty has been performed. Osseous structures are age-appropriate. No acute bone abnormality. No lytic or blastic bone lesion. Review of the MIP images confirms the above findings. IMPRESSION: 1. No pulmonary embolism. 2. Pulmonary arterial hypertension. 3. Mild coronary artery calcification. Mild global cardiomegaly. 4. Trace bilateral pleural effusions. 5. Ovoid gas and soft tissue density within the right mainstem bronchus measuring 17 mm is new from prior examination and may represent adherent mucus though an aspirated foreign body could appear similarly. 6. Moderate to severe pancolonic diverticulosis without superimposed acute inflammatory change. 7. Marked prostatic  hypertrophy. 8. Tubular fluid collection within the corpus spongiosum of the penis, new since prior examination, in keeping with a distended anterior urethra, false lumen secondary to traumatic catheterization, or a penile abscess. Aortic Atherosclerosis (ICD10-I70.0). Electronically Signed  By: Helyn Numbers M.D.   On: 11/06/2022 01:15   CT HEAD WO CONTRAST  Result Date: 11/05/2022 CLINICAL DATA:  Mental status change EXAM: CT HEAD WITHOUT CONTRAST TECHNIQUE: Contiguous axial images were obtained from the base of the skull through the vertex without intravenous contrast. RADIATION DOSE REDUCTION: This exam was performed according to the departmental dose-optimization program which includes automated exposure control, adjustment of the mA and/or kV according to patient size and/or use of iterative reconstruction technique. COMPARISON:  Head CT 08/24/2022 FINDINGS: Brain: No evidence of acute infarction, hemorrhage, hydrocephalus, extra-axial collection or mass lesion/mass effect. Again seen is mild diffuse atrophy. There is an old lacunar infarct in the left corona radiata and basal ganglia, unchanged. Vascular: No hyperdense vessel or unexpected calcification. Skull: Normal. Negative for fracture or focal lesion. Sinuses/Orbits: No acute finding. Other: None. IMPRESSION: 1. No acute intracranial abnormality. 2. Mild diffuse atrophy. 3. 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 Electronically Signed   By: Darliss Cheney M.D.   On: 11/05/2022 22:21    Microbiology: Recent Results (from the past 240 hour(s))  Blood Culture (routine x 2)     Status: None   Collection Time: 11/05/22  9:57 PM   Specimen: BLOOD  Result Value Ref Range Status   Specimen Description   Final    BLOOD BLOOD RIGHT HAND Performed at [facility], [address] [place], [place] [address]    Special Requests   Final    BOTTLES DRAWN AEROBIC AND ANAEROBIC Blood Culture  adequate volume Performed at [facility], [address] [place], [place] [address]    Culture   Final    NO GROWTH 5 DAYS Performed at [facility], [address], [place], [place] [address]    Report Status 11/11/2022 FINAL  Final  Blood Culture (routine x 2)     Status: None   Collection Time: 11/05/22  9:57 PM   Specimen: BLOOD  Result Value Ref Range Status   Specimen Description   Final    BLOOD RIGHT ANTECUBITAL Performed at [facility], [address] [place], [place] [address]    Special Requests   Final    BOTTLES DRAWN AEROBIC AND ANAEROBIC Blood Culture adequate volume Performed at [facility], [address], [place], [place] [address]    Culture   Final    NO GROWTH 5 DAYS Performed at [facility], [address] [place], [place] [address]    Report Status 11/11/2022 FINAL  Final  MRSA Next Gen by PCR, Nasal     Status: None   Collection Time: 11/06/22  2:29 PM   Specimen: Nasal Mucosa; Nasal Swab  Result Value Ref Range Status   MRSA by PCR Next Gen NOT DETECTED NOT DETECTED Final    Comment: (NOTE) The GeneXpert MRSA Assay (FDA approved for NASAL specimens only), is one component of a comprehensive MRSA colonization surveillance program. It is not intended to diagnose MRSA infection nor to guide or monitor treatment for MRSA infections. Test performance is not FDA approved in patients less than 52 years old. Performed at [facility], [address] [place], [place] [address]      Labs: Basic Metabolic Panel: Recent Labs  Lab 11/05/22 2114 11/05/22 2133 11/06/22 0925 11/08/22 0036 11/09/22 0807  NA 136 138 136 136 137  K 4.8 4.9 4.8 4.7 3.8  CL 102 105  103 102 106  CO2 25  --  21* 26 27  GLUCOSE 89 89 74 134* 98  BUN 43* 38* 38* 44* 25*  CREATININE 1.22 1.30* 1.10 1.26* 1.00  CALCIUM 9.3  --  9.1 9.1 8.7*  MG 2.3  --  2.1  --   --    Liver  Function Tests: Recent Labs  Lab 11/05/22 2114 11/06/22 0925  AST 23 20  ALT 19 19  ALKPHOS 107 94  BILITOT 0.6 0.8  PROT 7.3 6.9  ALBUMIN 3.4* 3.2*   No results for input(s): "LIPASE", "AMYLASE" in the last 168 hours. Recent Labs  Lab 11/05/22 2114  AMMONIA 19   CBC: Recent Labs  Lab 11/05/22 2114 11/05/22 2133 11/06/22 0925 11/08/22 0036  WBC 5.4  --  5.4 6.5  NEUTROABS 2.8  --  3.3  --   HGB 11.9* 12.6* 11.5* 10.2*  HCT 36.6* 37.0* 35.9* 31.7*  MCV 101.4*  --  102.3* 103.3*  PLT 161  --  155 147*   Cardiac Enzymes: Recent Labs  Lab 11/05/22 2114  CKTOTAL 47*   BNP: BNP (last 3 results) Recent Labs    08/25/22 0715 08/26/22 0252 11/05/22 2114  BNP 374.9* 260.3* 203.2*    ProBNP (last 3 results) No results for input(s): "PROBNP" in the last 8760 hours.  CBG: Recent Labs  Lab 11/05/22 2117  GLUCAP 84       Signed:  Rhetta Mura MD   [facility] 11/11/2022, 9:19 AM

## 2022-11-11 NOTE — Progress Notes (Signed)
[facility] Liaison Note  Hospital bed ordered through [facility] with confirmed delivery address of [address] [place] [address].  Daughter, Larene Beach is aware that bed is to be delivered first thing tomorrow morning.  She will be available to coordinate delivery with [facility].  TOC and MD updated on plan and plan to discharge patient after bed delivered.  Please call with any hospice related questions or concerns,  Doreatha Martin, RN, [facility] Liaison [phone]

## 2022-11-12 NOTE — Plan of Care (Signed)

## 2022-11-12 NOTE — TOC Transition Note (Signed)
Transition of Care [facility]) - CM/SW Discharge Note   Patient Details  Name: Terry Gregory MRN: [id number] Date of Birth: 1921/06/29  Transition of Care [facility] CM/SW Contact:  Beckie Busing, RN Phone Number:[phone]  11/12/2022, 11:48 AM   Clinical Narrative:    Patient with discharge order expecting to d/c home with hospice. Daughter is at bedside and per nurse has verified that DME hospital bed has been delivered. Transportation has been arranged per [facility].  D/c packet is at nurses station. No other needs noted at this time. TOC will sign off.    Final next level of care: Home w Hospice Care Barriers to Discharge: No Barriers Identified   Patient Goals and CMS Choice   Choice offered to / list presented to : Adult Children  Discharge Placement                    Name of family member notified: Lawson Mahone Patient and family notified of of transfer: 11/12/22  Discharge Plan and Services Additional resources added to the After Visit Summary for                            [facility] Arranged: NA HH Agency: NA        Social Determinants of Health (SDOH) Interventions SDOH Screenings   Food Insecurity: No Food Insecurity (11/06/2022)  Housing: Low Risk  (11/06/2022)  Transportation Needs: No Transportation Needs (11/06/2022)  Utilities: Not At Risk (11/06/2022)  Tobacco Use: Low Risk  (11/06/2022)     Readmission Risk Interventions    08/26/2022   11:30 AM  Readmission Risk Prevention Plan  Post Dischage Appt Complete  Medication Screening Complete  Transportation Screening Complete

## 2022-11-12 NOTE — Progress Notes (Signed)
Patient stable for d/c No changes

## 2022-12-18 ENCOUNTER — Ambulatory Visit (INDEPENDENT_AMBULATORY_CARE_PROVIDER_SITE_OTHER)

## 2022-12-18 DIAGNOSIS — I441 Atrioventricular block, second degree: Secondary | ICD-10-CM

## 2022-12-19 LAB — CUP PACEART REMOTE DEVICE CHECK
Battery Remaining Longevity: 95 mo
Battery Voltage: 2.99 V
Brady Statistic AP VP Percent: 89.56 %
Brady Statistic AP VS Percent: 0.02 %
Brady Statistic AS VP Percent: 10.32 %
Brady Statistic AS VS Percent: 0.09 %
Brady Statistic RA Percent Paced: 89.48 %
Brady Statistic RV Percent Paced: 99.89 %
Date Time Interrogation Session: 20241105024217
Implantable Lead Connection Status: 753985
Implantable Lead Connection Status: 753985
Implantable Lead Implant Date: 20220207
Implantable Lead Implant Date: 20220207
Implantable Lead Location: 753859
Implantable Lead Location: 753860
Implantable Lead Model: 5076
Implantable Lead Model: 5076
Implantable Pulse Generator Implant Date: 20220207
Lead Channel Impedance Value: 342 Ohm
Lead Channel Impedance Value: 342 Ohm
Lead Channel Impedance Value: 418 Ohm
Lead Channel Impedance Value: 437 Ohm
Lead Channel Pacing Threshold Amplitude: 0.625 V
Lead Channel Pacing Threshold Amplitude: 0.75 V
Lead Channel Pacing Threshold Pulse Width: 0.4 ms
Lead Channel Pacing Threshold Pulse Width: 0.4 ms
Lead Channel Sensing Intrinsic Amplitude: 3.125 mV
Lead Channel Sensing Intrinsic Amplitude: 3.125 mV
Lead Channel Sensing Intrinsic Amplitude: 6.75 mV
Lead Channel Sensing Intrinsic Amplitude: 6.75 mV
Lead Channel Setting Pacing Amplitude: 2 V
Lead Channel Setting Pacing Amplitude: 2.5 V
Lead Channel Setting Pacing Pulse Width: 0.4 ms
Lead Channel Setting Sensing Sensitivity: 1.2 mV
Zone Setting Status: 755011

## 2023-01-02 ENCOUNTER — Ambulatory Visit: Payer: Medicare Other | Admitting: Cardiovascular Disease

## 2023-01-15 NOTE — Progress Notes (Signed)
Remote pacemaker transmission.   

## 2023-03-19 ENCOUNTER — Ambulatory Visit (INDEPENDENT_AMBULATORY_CARE_PROVIDER_SITE_OTHER): Payer: Medicare Other

## 2023-03-19 DIAGNOSIS — I441 Atrioventricular block, second degree: Secondary | ICD-10-CM

## 2023-03-20 LAB — CUP PACEART REMOTE DEVICE CHECK
Battery Remaining Longevity: 93 mo
Battery Voltage: 2.98 V
Brady Statistic AP VP Percent: 97.14 %
Brady Statistic AP VS Percent: 0.05 %
Brady Statistic AS VP Percent: 2.77 %
Brady Statistic AS VS Percent: 0.03 %
Brady Statistic RA Percent Paced: 97.2 %
Brady Statistic RV Percent Paced: 99.92 %
Date Time Interrogation Session: 20250203183700
Implantable Lead Connection Status: 753985
Implantable Lead Connection Status: 753985
Implantable Lead Implant Date: 20220207
Implantable Lead Implant Date: 20220207
Implantable Lead Location: 753859
Implantable Lead Location: 753860
Implantable Lead Model: 5076
Implantable Lead Model: 5076
Implantable Pulse Generator Implant Date: 20220207
Lead Channel Impedance Value: 361 Ohm
Lead Channel Impedance Value: 380 Ohm
Lead Channel Impedance Value: 418 Ohm
Lead Channel Impedance Value: 494 Ohm
Lead Channel Pacing Threshold Amplitude: 0.75 V
Lead Channel Pacing Threshold Amplitude: 0.75 V
Lead Channel Pacing Threshold Pulse Width: 0.4 ms
Lead Channel Pacing Threshold Pulse Width: 0.4 ms
Lead Channel Sensing Intrinsic Amplitude: 3.75 mV
Lead Channel Sensing Intrinsic Amplitude: 3.75 mV
Lead Channel Sensing Intrinsic Amplitude: 6.875 mV
Lead Channel Sensing Intrinsic Amplitude: 6.875 mV
Lead Channel Setting Pacing Amplitude: 2 V
Lead Channel Setting Pacing Amplitude: 2.5 V
Lead Channel Setting Pacing Pulse Width: 0.4 ms
Lead Channel Setting Sensing Sensitivity: 1.2 mV
Zone Setting Status: 755011

## 2023-03-23 ENCOUNTER — Encounter: Payer: Self-pay | Admitting: Cardiovascular Disease

## 2023-04-10 ENCOUNTER — Telehealth: Payer: Self-pay | Admitting: Cardiovascular Disease

## 2023-04-10 NOTE — Telephone Encounter (Signed)
 Daughter Larene Beach) called to report patient passed away yesterday (April 15, 2023).

## 2023-04-13 DEATH — deceased

## 2023-04-25 NOTE — Addendum Note (Signed)
 Addended by: Geralyn Flash D on: 04/25/2023 03:18 PM   Modules accepted: Orders, Level of Service

## 2023-04-25 NOTE — Progress Notes (Signed)
 Remote pacemaker transmission.

## 2023-06-18 ENCOUNTER — Ambulatory Visit: Payer: Medicare Other

## 2023-09-17 ENCOUNTER — Ambulatory Visit: Payer: Medicare Other

## 2023-12-17 ENCOUNTER — Ambulatory Visit: Payer: Medicare Other

## 2024-03-17 ENCOUNTER — Ambulatory Visit: Payer: Medicare Other
